# Patient Record
Sex: Male | Born: 1935 | Race: White | Hispanic: No | Marital: Married | Smoking: Former smoker
Health system: Southern US, Community
[De-identification: ages and names within clinical notes are randomized; demographics above are authoritative.]

## PROBLEM LIST (undated history)

## (undated) DIAGNOSIS — I509 Heart failure, unspecified: Secondary | ICD-10-CM

## (undated) DIAGNOSIS — I639 Cerebral infarction, unspecified: Secondary | ICD-10-CM

## (undated) DIAGNOSIS — N400 Enlarged prostate without lower urinary tract symptoms: Secondary | ICD-10-CM

## (undated) DIAGNOSIS — I4891 Unspecified atrial fibrillation: Secondary | ICD-10-CM

## (undated) DIAGNOSIS — I459 Conduction disorder, unspecified: Secondary | ICD-10-CM

## (undated) DIAGNOSIS — I6529 Occlusion and stenosis of unspecified carotid artery: Secondary | ICD-10-CM

## (undated) DIAGNOSIS — M1990 Unspecified osteoarthritis, unspecified site: Secondary | ICD-10-CM

## (undated) DIAGNOSIS — R569 Unspecified convulsions: Secondary | ICD-10-CM

## (undated) DIAGNOSIS — K219 Gastro-esophageal reflux disease without esophagitis: Secondary | ICD-10-CM

## (undated) DIAGNOSIS — E785 Hyperlipidemia, unspecified: Secondary | ICD-10-CM

## (undated) DIAGNOSIS — I10 Essential (primary) hypertension: Secondary | ICD-10-CM

## (undated) DIAGNOSIS — I2510 Atherosclerotic heart disease of native coronary artery without angina pectoris: Secondary | ICD-10-CM

## (undated) DIAGNOSIS — C801 Malignant (primary) neoplasm, unspecified: Secondary | ICD-10-CM

## (undated) DIAGNOSIS — J449 Chronic obstructive pulmonary disease, unspecified: Secondary | ICD-10-CM

## (undated) DIAGNOSIS — N289 Disorder of kidney and ureter, unspecified: Secondary | ICD-10-CM

## (undated) DIAGNOSIS — M8580 Other specified disorders of bone density and structure, unspecified site: Secondary | ICD-10-CM

## (undated) DIAGNOSIS — J852 Abscess of lung without pneumonia: Secondary | ICD-10-CM

## (undated) HISTORY — DX: Unspecified osteoarthritis, unspecified site: M19.90

## (undated) HISTORY — DX: Heart failure, unspecified: I50.9

## (undated) HISTORY — DX: Other specified disorders of bone density and structure, unspecified site: M85.80

## (undated) HISTORY — DX: Unspecified convulsions: R56.9

## (undated) HISTORY — DX: Cerebral infarction, unspecified: I63.9

## (undated) HISTORY — DX: Hyperlipidemia, unspecified: E78.5

## (undated) HISTORY — DX: Chronic obstructive pulmonary disease, unspecified: J44.9

## (undated) HISTORY — DX: Benign prostatic hyperplasia without lower urinary tract symptoms: N40.0

## (undated) HISTORY — DX: Disorder of kidney and ureter, unspecified: N28.9

## (undated) HISTORY — DX: Conduction disorder, unspecified: I45.9

## (undated) HISTORY — DX: Occlusion and stenosis of unspecified carotid artery: I65.29

## (undated) HISTORY — DX: Gastro-esophageal reflux disease without esophagitis: K21.9

## (undated) HISTORY — DX: Abscess of lung without pneumonia: J85.2

## (undated) HISTORY — DX: Essential (primary) hypertension: I10

## (undated) HISTORY — DX: Unspecified atrial fibrillation: I48.91

## (undated) HISTORY — DX: Malignant (primary) neoplasm, unspecified: C80.1

## (undated) HISTORY — PX: PACEMAKER INSERTION: SHX728

## (undated) HISTORY — DX: Atherosclerotic heart disease of native coronary artery without angina pectoris: I25.10

---

## 2000-06-17 ENCOUNTER — Encounter: Payer: Self-pay | Admitting: Infectious Diseases

## 2000-06-17 ENCOUNTER — Inpatient Hospital Stay (HOSPITAL_COMMUNITY): Admission: EM | Admit: 2000-06-17 | Discharge: 2000-06-20 | Payer: Self-pay | Admitting: Emergency Medicine

## 2000-11-26 ENCOUNTER — Encounter: Payer: Self-pay | Admitting: Emergency Medicine

## 2000-11-26 ENCOUNTER — Inpatient Hospital Stay (HOSPITAL_COMMUNITY): Admission: EM | Admit: 2000-11-26 | Discharge: 2000-11-30 | Payer: Self-pay | Admitting: Emergency Medicine

## 2001-03-07 ENCOUNTER — Encounter: Payer: Self-pay | Admitting: Emergency Medicine

## 2001-03-07 ENCOUNTER — Inpatient Hospital Stay (HOSPITAL_COMMUNITY): Admission: EM | Admit: 2001-03-07 | Discharge: 2001-03-09 | Payer: Self-pay | Admitting: Emergency Medicine

## 2001-03-25 ENCOUNTER — Emergency Department (HOSPITAL_COMMUNITY): Admission: EM | Admit: 2001-03-25 | Discharge: 2001-03-25 | Payer: Self-pay

## 2001-08-18 ENCOUNTER — Inpatient Hospital Stay (HOSPITAL_COMMUNITY): Admission: EM | Admit: 2001-08-18 | Discharge: 2001-08-19 | Payer: Self-pay

## 2002-02-14 ENCOUNTER — Encounter: Payer: Self-pay | Admitting: Critical Care Medicine

## 2002-02-14 ENCOUNTER — Inpatient Hospital Stay (HOSPITAL_COMMUNITY): Admission: AD | Admit: 2002-02-14 | Discharge: 2002-02-21 | Payer: Self-pay | Admitting: Critical Care Medicine

## 2002-02-15 ENCOUNTER — Encounter: Payer: Self-pay | Admitting: Critical Care Medicine

## 2002-02-21 ENCOUNTER — Encounter (INDEPENDENT_AMBULATORY_CARE_PROVIDER_SITE_OTHER): Payer: Self-pay | Admitting: Specialist

## 2002-02-21 ENCOUNTER — Encounter (INDEPENDENT_AMBULATORY_CARE_PROVIDER_SITE_OTHER): Payer: Self-pay | Admitting: *Deleted

## 2002-02-21 ENCOUNTER — Encounter: Payer: Self-pay | Admitting: Critical Care Medicine

## 2002-03-05 ENCOUNTER — Encounter: Payer: Self-pay | Admitting: Critical Care Medicine

## 2002-03-05 ENCOUNTER — Encounter: Admission: RE | Admit: 2002-03-05 | Discharge: 2002-03-05 | Payer: Self-pay | Admitting: Critical Care Medicine

## 2002-07-04 ENCOUNTER — Inpatient Hospital Stay (HOSPITAL_COMMUNITY): Admission: AD | Admit: 2002-07-04 | Discharge: 2002-07-05 | Payer: Self-pay | Admitting: Internal Medicine

## 2002-08-08 ENCOUNTER — Encounter: Payer: Self-pay | Admitting: Emergency Medicine

## 2002-08-08 ENCOUNTER — Inpatient Hospital Stay (HOSPITAL_COMMUNITY): Admission: EM | Admit: 2002-08-08 | Discharge: 2002-08-12 | Payer: Self-pay | Admitting: Emergency Medicine

## 2002-08-10 ENCOUNTER — Encounter: Payer: Self-pay | Admitting: Internal Medicine

## 2002-08-21 ENCOUNTER — Encounter: Payer: Self-pay | Admitting: Cardiology

## 2002-08-21 ENCOUNTER — Inpatient Hospital Stay (HOSPITAL_COMMUNITY): Admission: RE | Admit: 2002-08-21 | Discharge: 2002-08-27 | Payer: Self-pay | Admitting: Cardiology

## 2004-06-04 ENCOUNTER — Ambulatory Visit: Payer: Self-pay | Admitting: Internal Medicine

## 2004-09-07 ENCOUNTER — Ambulatory Visit: Payer: Self-pay | Admitting: Critical Care Medicine

## 2004-09-16 ENCOUNTER — Ambulatory Visit: Payer: Self-pay | Admitting: Critical Care Medicine

## 2004-10-05 ENCOUNTER — Emergency Department (HOSPITAL_COMMUNITY): Admission: EM | Admit: 2004-10-05 | Discharge: 2004-10-05 | Payer: Self-pay | Admitting: Emergency Medicine

## 2004-10-12 ENCOUNTER — Ambulatory Visit: Payer: Self-pay | Admitting: Internal Medicine

## 2004-10-14 ENCOUNTER — Ambulatory Visit: Payer: Self-pay | Admitting: Critical Care Medicine

## 2005-01-07 ENCOUNTER — Ambulatory Visit: Payer: Self-pay | Admitting: Internal Medicine

## 2005-01-12 ENCOUNTER — Ambulatory Visit: Payer: Self-pay | Admitting: Critical Care Medicine

## 2005-01-25 ENCOUNTER — Ambulatory Visit: Payer: Self-pay

## 2005-03-21 ENCOUNTER — Ambulatory Visit: Payer: Self-pay | Admitting: Internal Medicine

## 2005-03-31 ENCOUNTER — Ambulatory Visit: Payer: Self-pay | Admitting: Internal Medicine

## 2005-04-17 ENCOUNTER — Ambulatory Visit: Payer: Self-pay | Admitting: Internal Medicine

## 2005-04-17 ENCOUNTER — Inpatient Hospital Stay (HOSPITAL_COMMUNITY): Admission: EM | Admit: 2005-04-17 | Discharge: 2005-04-20 | Payer: Self-pay | Admitting: Emergency Medicine

## 2005-04-19 ENCOUNTER — Ambulatory Visit: Payer: Self-pay | Admitting: Internal Medicine

## 2005-04-19 ENCOUNTER — Ambulatory Visit: Payer: Self-pay | Admitting: Cardiology

## 2005-04-19 ENCOUNTER — Encounter: Payer: Self-pay | Admitting: Cardiology

## 2005-05-06 ENCOUNTER — Ambulatory Visit: Payer: Self-pay | Admitting: Critical Care Medicine

## 2005-06-01 ENCOUNTER — Ambulatory Visit: Payer: Self-pay | Admitting: Internal Medicine

## 2005-07-01 ENCOUNTER — Ambulatory Visit: Payer: Self-pay | Admitting: Critical Care Medicine

## 2005-10-03 ENCOUNTER — Ambulatory Visit: Payer: Self-pay | Admitting: Critical Care Medicine

## 2006-03-14 ENCOUNTER — Ambulatory Visit: Payer: Self-pay | Admitting: Internal Medicine

## 2006-03-17 ENCOUNTER — Ambulatory Visit: Payer: Self-pay | Admitting: Internal Medicine

## 2006-03-22 ENCOUNTER — Ambulatory Visit: Payer: Self-pay | Admitting: Critical Care Medicine

## 2006-03-28 ENCOUNTER — Ambulatory Visit: Payer: Self-pay | Admitting: *Deleted

## 2006-04-05 ENCOUNTER — Ambulatory Visit: Payer: Self-pay | Admitting: Internal Medicine

## 2006-04-05 LAB — CONVERTED CEMR LAB
ALT: 14 units/L (ref 0–40)
AST: 16 units/L (ref 0–37)
Albumin: 3.7 g/dL (ref 3.5–5.2)
Alkaline Phosphatase: 65 units/L (ref 39–117)
Amylase: 90 units/L (ref 27–131)
BUN: 15 mg/dL (ref 6–23)
CO2: 29 meq/L (ref 19–32)
Calcium: 8.9 mg/dL (ref 8.4–10.5)
Chloride: 101 meq/L (ref 96–112)
Creatinine, Ser: 1.1 mg/dL (ref 0.4–1.5)
GFR calc non Af Amer: 70 mL/min
Glomerular Filtration Rate, Af Am: 84 mL/min/{1.73_m2}
Glucose, Bld: 84 mg/dL (ref 70–99)
HCT: 42.3 % (ref 39.0–52.0)
Hemoglobin: 14.2 g/dL (ref 13.0–17.0)
Lipase: 22 units/L (ref 11.0–59.0)
MCHC: 33.5 g/dL (ref 30.0–36.0)
MCV: 98.8 fL (ref 78.0–100.0)
Platelets: 199 10*3/uL (ref 150–400)
Potassium: 4.4 meq/L (ref 3.5–5.1)
RBC: 4.28 M/uL (ref 4.22–5.81)
RDW: 13.5 % (ref 11.5–14.6)
Sodium: 136 meq/L (ref 135–145)
Total Bilirubin: 0.9 mg/dL (ref 0.3–1.2)
Total Protein: 6.8 g/dL (ref 6.0–8.3)
WBC: 5.1 10*3/uL (ref 4.5–10.5)

## 2006-04-17 ENCOUNTER — Ambulatory Visit: Payer: Self-pay | Admitting: Internal Medicine

## 2006-04-19 ENCOUNTER — Ambulatory Visit: Payer: Self-pay | Admitting: Critical Care Medicine

## 2006-04-20 ENCOUNTER — Encounter (INDEPENDENT_AMBULATORY_CARE_PROVIDER_SITE_OTHER): Payer: Self-pay | Admitting: Specialist

## 2006-04-20 ENCOUNTER — Ambulatory Visit: Payer: Self-pay | Admitting: Internal Medicine

## 2006-04-20 DIAGNOSIS — K449 Diaphragmatic hernia without obstruction or gangrene: Secondary | ICD-10-CM | POA: Insufficient documentation

## 2006-05-25 ENCOUNTER — Ambulatory Visit: Payer: Self-pay | Admitting: Internal Medicine

## 2006-08-22 ENCOUNTER — Ambulatory Visit: Payer: Self-pay | Admitting: Critical Care Medicine

## 2006-08-29 ENCOUNTER — Ambulatory Visit: Payer: Self-pay | Admitting: Internal Medicine

## 2006-09-01 DIAGNOSIS — I4891 Unspecified atrial fibrillation: Secondary | ICD-10-CM | POA: Insufficient documentation

## 2006-09-01 DIAGNOSIS — N400 Enlarged prostate without lower urinary tract symptoms: Secondary | ICD-10-CM

## 2006-09-01 DIAGNOSIS — K209 Esophagitis, unspecified without bleeding: Secondary | ICD-10-CM | POA: Insufficient documentation

## 2006-09-01 DIAGNOSIS — J439 Emphysema, unspecified: Secondary | ICD-10-CM

## 2007-01-29 ENCOUNTER — Encounter: Payer: Self-pay | Admitting: Internal Medicine

## 2007-01-30 ENCOUNTER — Encounter: Payer: Self-pay | Admitting: Internal Medicine

## 2007-01-31 ENCOUNTER — Other Ambulatory Visit: Payer: Self-pay | Admitting: Emergency Medicine

## 2007-01-31 ENCOUNTER — Telehealth: Payer: Self-pay | Admitting: Internal Medicine

## 2007-02-01 ENCOUNTER — Observation Stay (HOSPITAL_COMMUNITY): Admission: EM | Admit: 2007-02-01 | Discharge: 2007-02-02 | Payer: Self-pay | Admitting: Internal Medicine

## 2007-02-01 ENCOUNTER — Ambulatory Visit: Payer: Self-pay | Admitting: Internal Medicine

## 2007-02-16 ENCOUNTER — Telehealth (INDEPENDENT_AMBULATORY_CARE_PROVIDER_SITE_OTHER): Payer: Self-pay | Admitting: *Deleted

## 2007-02-16 ENCOUNTER — Ambulatory Visit: Payer: Self-pay | Admitting: Internal Medicine

## 2007-02-16 DIAGNOSIS — E785 Hyperlipidemia, unspecified: Secondary | ICD-10-CM

## 2007-02-16 DIAGNOSIS — R569 Unspecified convulsions: Secondary | ICD-10-CM | POA: Insufficient documentation

## 2007-02-16 DIAGNOSIS — I10 Essential (primary) hypertension: Secondary | ICD-10-CM | POA: Insufficient documentation

## 2007-02-19 ENCOUNTER — Encounter (INDEPENDENT_AMBULATORY_CARE_PROVIDER_SITE_OTHER): Payer: Self-pay | Admitting: *Deleted

## 2007-02-19 LAB — CONVERTED CEMR LAB
BUN: 11 mg/dL (ref 6–23)
Basophils Absolute: 0 10*3/uL (ref 0.0–0.1)
Basophils Relative: 0 % (ref 0.0–1.0)
CO2: 31 meq/L (ref 19–32)
Calcium: 9 mg/dL (ref 8.4–10.5)
Chloride: 103 meq/L (ref 96–112)
Creatinine, Ser: 0.9 mg/dL (ref 0.4–1.5)
Eosinophils Absolute: 0.1 10*3/uL (ref 0.0–0.6)
Eosinophils Relative: 2.4 % (ref 0.0–5.0)
GFR calc Af Amer: 106 mL/min
GFR calc non Af Amer: 88 mL/min
Glucose, Bld: 91 mg/dL (ref 70–99)
HCT: 41.4 % (ref 39.0–52.0)
Hemoglobin: 13.9 g/dL (ref 13.0–17.0)
Iron: 90 ug/dL (ref 42–165)
Lymphocytes Relative: 22.3 % (ref 12.0–46.0)
MCHC: 33.6 g/dL (ref 30.0–36.0)
MCV: 99.8 fL (ref 78.0–100.0)
Monocytes Absolute: 0.6 10*3/uL (ref 0.2–0.7)
Monocytes Relative: 10.7 % (ref 3.0–11.0)
Neutro Abs: 3.3 10*3/uL (ref 1.4–7.7)
Neutrophils Relative %: 64.6 % (ref 43.0–77.0)
Platelets: 169 10*3/uL (ref 150–400)
Potassium: 4.2 meq/L (ref 3.5–5.1)
RBC: 4.15 M/uL — ABNORMAL LOW (ref 4.22–5.81)
RDW: 13.8 % (ref 11.5–14.6)
Sodium: 139 meq/L (ref 135–145)
WBC: 5.2 10*3/uL (ref 4.5–10.5)

## 2007-03-04 DIAGNOSIS — J852 Abscess of lung without pneumonia: Secondary | ICD-10-CM | POA: Insufficient documentation

## 2007-03-04 DIAGNOSIS — I2510 Atherosclerotic heart disease of native coronary artery without angina pectoris: Secondary | ICD-10-CM | POA: Insufficient documentation

## 2007-03-04 DIAGNOSIS — K219 Gastro-esophageal reflux disease without esophagitis: Secondary | ICD-10-CM

## 2007-03-12 ENCOUNTER — Ambulatory Visit: Payer: Self-pay | Admitting: Internal Medicine

## 2007-03-28 ENCOUNTER — Ambulatory Visit: Payer: Self-pay | Admitting: Internal Medicine

## 2007-04-27 ENCOUNTER — Telehealth: Payer: Self-pay | Admitting: Critical Care Medicine

## 2007-04-30 ENCOUNTER — Ambulatory Visit: Payer: Self-pay | Admitting: Critical Care Medicine

## 2007-04-30 DIAGNOSIS — I509 Heart failure, unspecified: Secondary | ICD-10-CM | POA: Insufficient documentation

## 2007-04-30 LAB — CONVERTED CEMR LAB
BUN: 14 mg/dL (ref 6–23)
Basophils Absolute: 0 10*3/uL (ref 0.0–0.1)
Basophils Relative: 0.1 % (ref 0.0–1.0)
CO2: 28 meq/L (ref 19–32)
Calcium: 8.7 mg/dL (ref 8.4–10.5)
Chloride: 97 meq/L (ref 96–112)
Creatinine, Ser: 1 mg/dL (ref 0.4–1.5)
Eosinophils Absolute: 0.1 10*3/uL (ref 0.0–0.6)
Eosinophils Relative: 2.9 % (ref 0.0–5.0)
GFR calc Af Amer: 94 mL/min
GFR calc non Af Amer: 78 mL/min
Glucose, Bld: 129 mg/dL — ABNORMAL HIGH (ref 70–99)
HCT: 39.7 % (ref 39.0–52.0)
Hemoglobin: 13.2 g/dL (ref 13.0–17.0)
INR: 2 — ABNORMAL HIGH (ref 0.8–1.0)
Lymphocytes Relative: 20.4 % (ref 12.0–46.0)
MCHC: 33.2 g/dL (ref 30.0–36.0)
MCV: 98.9 fL (ref 78.0–100.0)
Monocytes Absolute: 0.7 10*3/uL (ref 0.2–0.7)
Monocytes Relative: 13.6 % — ABNORMAL HIGH (ref 3.0–11.0)
Neutro Abs: 3 10*3/uL (ref 1.4–7.7)
Neutrophils Relative %: 63 % (ref 43.0–77.0)
Platelets: 185 10*3/uL (ref 150–400)
Potassium: 3.8 meq/L (ref 3.5–5.1)
Prothrombin Time: 17.4 s — ABNORMAL HIGH (ref 10.9–13.3)
RBC: 4.01 M/uL — ABNORMAL LOW (ref 4.22–5.81)
RDW: 12.9 % (ref 11.5–14.6)
Sodium: 131 meq/L — ABNORMAL LOW (ref 135–145)
WBC: 4.8 10*3/uL (ref 4.5–10.5)

## 2007-05-14 ENCOUNTER — Ambulatory Visit: Payer: Self-pay | Admitting: Critical Care Medicine

## 2007-05-18 ENCOUNTER — Ambulatory Visit: Payer: Self-pay | Admitting: Critical Care Medicine

## 2007-05-18 ENCOUNTER — Encounter: Payer: Self-pay | Admitting: Critical Care Medicine

## 2007-05-22 ENCOUNTER — Telehealth: Payer: Self-pay | Admitting: Critical Care Medicine

## 2007-06-21 ENCOUNTER — Telehealth (INDEPENDENT_AMBULATORY_CARE_PROVIDER_SITE_OTHER): Payer: Self-pay | Admitting: *Deleted

## 2007-06-21 ENCOUNTER — Ambulatory Visit: Payer: Self-pay | Admitting: Critical Care Medicine

## 2007-07-12 ENCOUNTER — Encounter: Payer: Self-pay | Admitting: Internal Medicine

## 2007-07-16 DIAGNOSIS — D68318 Other hemorrhagic disorder due to intrinsic circulating anticoagulants, antibodies, or inhibitors: Secondary | ICD-10-CM | POA: Insufficient documentation

## 2007-07-27 ENCOUNTER — Encounter: Payer: Self-pay | Admitting: Critical Care Medicine

## 2007-08-02 ENCOUNTER — Encounter: Payer: Self-pay | Admitting: Internal Medicine

## 2007-08-15 ENCOUNTER — Ambulatory Visit: Payer: Self-pay | Admitting: Critical Care Medicine

## 2007-08-24 ENCOUNTER — Encounter: Payer: Self-pay | Admitting: Critical Care Medicine

## 2007-09-03 ENCOUNTER — Encounter: Payer: Self-pay | Admitting: Internal Medicine

## 2007-12-04 ENCOUNTER — Encounter: Payer: Self-pay | Admitting: Internal Medicine

## 2008-01-03 ENCOUNTER — Encounter: Payer: Self-pay | Admitting: Internal Medicine

## 2008-02-05 ENCOUNTER — Encounter: Payer: Self-pay | Admitting: Internal Medicine

## 2008-02-18 ENCOUNTER — Ambulatory Visit: Payer: Self-pay | Admitting: Internal Medicine

## 2008-03-21 ENCOUNTER — Encounter: Payer: Self-pay | Admitting: Critical Care Medicine

## 2008-03-24 ENCOUNTER — Encounter: Payer: Self-pay | Admitting: Internal Medicine

## 2008-03-28 ENCOUNTER — Encounter: Payer: Self-pay | Admitting: Internal Medicine

## 2008-04-03 ENCOUNTER — Ambulatory Visit: Payer: Self-pay | Admitting: Internal Medicine

## 2008-04-07 ENCOUNTER — Encounter: Payer: Self-pay | Admitting: Internal Medicine

## 2008-04-11 ENCOUNTER — Encounter: Payer: Self-pay | Admitting: Internal Medicine

## 2008-05-12 ENCOUNTER — Encounter: Payer: Self-pay | Admitting: Critical Care Medicine

## 2008-05-13 ENCOUNTER — Encounter (INDEPENDENT_AMBULATORY_CARE_PROVIDER_SITE_OTHER): Payer: Self-pay | Admitting: *Deleted

## 2008-05-22 ENCOUNTER — Encounter: Payer: Self-pay | Admitting: Critical Care Medicine

## 2008-06-17 ENCOUNTER — Encounter: Payer: Self-pay | Admitting: Internal Medicine

## 2008-06-23 HISTORY — PX: CARDIAC DEFIBRILLATOR PLACEMENT: SHX171

## 2008-06-24 ENCOUNTER — Ambulatory Visit: Payer: Self-pay | Admitting: Internal Medicine

## 2008-07-11 ENCOUNTER — Encounter: Payer: Self-pay | Admitting: Internal Medicine

## 2008-07-15 ENCOUNTER — Encounter: Payer: Self-pay | Admitting: Internal Medicine

## 2008-07-18 ENCOUNTER — Encounter: Payer: Self-pay | Admitting: Internal Medicine

## 2008-07-22 ENCOUNTER — Encounter: Payer: Self-pay | Admitting: Internal Medicine

## 2008-07-25 ENCOUNTER — Telehealth (INDEPENDENT_AMBULATORY_CARE_PROVIDER_SITE_OTHER): Payer: Self-pay | Admitting: *Deleted

## 2008-07-25 ENCOUNTER — Encounter: Payer: Self-pay | Admitting: Critical Care Medicine

## 2008-07-29 ENCOUNTER — Ambulatory Visit: Payer: Self-pay | Admitting: Critical Care Medicine

## 2008-07-30 ENCOUNTER — Ambulatory Visit: Payer: Self-pay | Admitting: Internal Medicine

## 2008-07-30 DIAGNOSIS — K4090 Unilateral inguinal hernia, without obstruction or gangrene, not specified as recurrent: Secondary | ICD-10-CM | POA: Insufficient documentation

## 2008-07-30 LAB — CONVERTED CEMR LAB
BUN: 16 mg/dL (ref 6–23)
CO2: 28 meq/L (ref 19–32)
Calcium: 8.6 mg/dL (ref 8.4–10.5)
Chloride: 102 meq/L (ref 96–112)
Creatinine, Ser: 0.9 mg/dL (ref 0.4–1.5)
GFR calc Af Amer: 106 mL/min
GFR calc non Af Amer: 87 mL/min
Glucose, Bld: 99 mg/dL (ref 70–99)
Potassium: 4.2 meq/L (ref 3.5–5.1)
Pro B Natriuretic peptide (BNP): 1095 pg/mL — ABNORMAL HIGH (ref 0.0–100.0)
Sodium: 139 meq/L (ref 135–145)

## 2008-08-05 ENCOUNTER — Ambulatory Visit: Payer: Self-pay | Admitting: Critical Care Medicine

## 2008-08-06 ENCOUNTER — Encounter: Payer: Self-pay | Admitting: Critical Care Medicine

## 2008-09-09 ENCOUNTER — Encounter: Payer: Self-pay | Admitting: Internal Medicine

## 2008-10-14 ENCOUNTER — Encounter: Payer: Self-pay | Admitting: Internal Medicine

## 2008-11-06 ENCOUNTER — Ambulatory Visit: Payer: Self-pay | Admitting: Internal Medicine

## 2008-11-06 DIAGNOSIS — M549 Dorsalgia, unspecified: Secondary | ICD-10-CM | POA: Insufficient documentation

## 2008-11-06 LAB — CONVERTED CEMR LAB
Bilirubin Urine: NEGATIVE
Blood in Urine, dipstick: NEGATIVE
Glucose, Urine, Semiquant: NEGATIVE
Ketones, urine, test strip: NEGATIVE
Nitrite: NEGATIVE
Protein, U semiquant: NEGATIVE
Specific Gravity, Urine: 1.01
Urobilinogen, UA: 0.2
WBC Urine, dipstick: NEGATIVE
pH: 7

## 2008-11-11 DIAGNOSIS — M949 Disorder of cartilage, unspecified: Secondary | ICD-10-CM

## 2008-11-11 DIAGNOSIS — M899 Disorder of bone, unspecified: Secondary | ICD-10-CM | POA: Insufficient documentation

## 2008-11-11 DIAGNOSIS — M1990 Unspecified osteoarthritis, unspecified site: Secondary | ICD-10-CM

## 2008-11-11 LAB — CONVERTED CEMR LAB
BUN: 21 mg/dL (ref 6–23)
Basophils Absolute: 0 10*3/uL (ref 0.0–0.1)
Basophils Relative: 0.7 % (ref 0.0–3.0)
CO2: 28 meq/L (ref 19–32)
Calcium: 8.7 mg/dL (ref 8.4–10.5)
Chloride: 103 meq/L (ref 96–112)
Creatinine, Ser: 1.1 mg/dL (ref 0.4–1.5)
Eosinophils Absolute: 0.2 10*3/uL (ref 0.0–0.7)
Eosinophils Relative: 5.6 % — ABNORMAL HIGH (ref 0.0–5.0)
GFR calc non Af Amer: 69.09 mL/min (ref >60)
Glucose, Bld: 83 mg/dL (ref 70–99)
HCT: 40.3 % (ref 39.0–52.0)
Hemoglobin: 14.3 g/dL (ref 13.0–17.0)
Lymphocytes Relative: 29.4 % (ref 12.0–46.0)
Lymphs Abs: 1.1 10*3/uL (ref 0.7–4.0)
MCHC: 35.5 g/dL (ref 30.0–36.0)
MCV: 93.4 fL (ref 78.0–100.0)
Monocytes Absolute: 0.5 10*3/uL (ref 0.1–1.0)
Monocytes Relative: 14.1 % — ABNORMAL HIGH (ref 3.0–12.0)
Neutro Abs: 2 10*3/uL (ref 1.4–7.7)
Neutrophils Relative %: 50.2 % (ref 43.0–77.0)
Platelets: 131 10*3/uL — ABNORMAL LOW (ref 150.0–400.0)
Potassium: 4.5 meq/L (ref 3.5–5.1)
RBC: 4.32 M/uL (ref 4.22–5.81)
RDW: 15.3 % — ABNORMAL HIGH (ref 11.5–14.6)
Sodium: 138 meq/L (ref 135–145)
WBC: 3.8 10*3/uL — ABNORMAL LOW (ref 4.5–10.5)

## 2008-11-18 ENCOUNTER — Encounter: Payer: Self-pay | Admitting: Internal Medicine

## 2008-11-27 ENCOUNTER — Ambulatory Visit: Payer: Self-pay | Admitting: Family Medicine

## 2008-11-27 ENCOUNTER — Encounter: Payer: Self-pay | Admitting: Internal Medicine

## 2008-12-11 ENCOUNTER — Encounter: Payer: Self-pay | Admitting: Critical Care Medicine

## 2008-12-31 ENCOUNTER — Ambulatory Visit: Payer: Self-pay | Admitting: Internal Medicine

## 2008-12-31 ENCOUNTER — Telehealth (INDEPENDENT_AMBULATORY_CARE_PROVIDER_SITE_OTHER): Payer: Self-pay | Admitting: *Deleted

## 2009-01-01 ENCOUNTER — Encounter: Payer: Self-pay | Admitting: Internal Medicine

## 2009-01-02 ENCOUNTER — Ambulatory Visit: Payer: Self-pay | Admitting: Internal Medicine

## 2009-01-08 ENCOUNTER — Encounter (INDEPENDENT_AMBULATORY_CARE_PROVIDER_SITE_OTHER): Payer: Self-pay | Admitting: *Deleted

## 2009-03-17 ENCOUNTER — Encounter: Payer: Self-pay | Admitting: Internal Medicine

## 2009-03-25 ENCOUNTER — Encounter: Payer: Self-pay | Admitting: Critical Care Medicine

## 2009-04-21 ENCOUNTER — Telehealth (INDEPENDENT_AMBULATORY_CARE_PROVIDER_SITE_OTHER): Payer: Self-pay | Admitting: *Deleted

## 2009-04-21 ENCOUNTER — Ambulatory Visit: Payer: Self-pay | Admitting: Internal Medicine

## 2009-07-08 ENCOUNTER — Encounter: Payer: Self-pay | Admitting: Critical Care Medicine

## 2009-08-04 ENCOUNTER — Telehealth (INDEPENDENT_AMBULATORY_CARE_PROVIDER_SITE_OTHER): Payer: Self-pay | Admitting: *Deleted

## 2009-09-21 ENCOUNTER — Ambulatory Visit: Payer: Self-pay | Admitting: Internal Medicine

## 2009-09-23 ENCOUNTER — Encounter (INDEPENDENT_AMBULATORY_CARE_PROVIDER_SITE_OTHER): Payer: Self-pay | Admitting: *Deleted

## 2009-09-24 ENCOUNTER — Telehealth: Payer: Self-pay | Admitting: Critical Care Medicine

## 2009-09-25 ENCOUNTER — Encounter: Payer: Self-pay | Admitting: Internal Medicine

## 2009-09-29 ENCOUNTER — Telehealth: Payer: Self-pay | Admitting: Critical Care Medicine

## 2009-10-05 ENCOUNTER — Encounter: Payer: Self-pay | Admitting: Critical Care Medicine

## 2009-10-05 HISTORY — PX: HERNIA REPAIR: SHX51

## 2009-10-09 ENCOUNTER — Telehealth: Payer: Self-pay | Admitting: Critical Care Medicine

## 2009-10-12 ENCOUNTER — Ambulatory Visit: Payer: Self-pay | Admitting: Internal Medicine

## 2009-10-12 ENCOUNTER — Ambulatory Visit: Payer: Self-pay | Admitting: Critical Care Medicine

## 2009-10-13 LAB — CONVERTED CEMR LAB
BUN: 22 mg/dL (ref 6–23)
Basophils Absolute: 0 10*3/uL (ref 0.0–0.1)
Basophils Relative: 0.5 % (ref 0.0–3.0)
Bilirubin Urine: NEGATIVE
CO2: 31 meq/L (ref 19–32)
Calcium: 8.7 mg/dL (ref 8.4–10.5)
Chloride: 102 meq/L (ref 96–112)
Cholesterol: 131 mg/dL (ref 0–200)
Creatinine, Ser: 1.1 mg/dL (ref 0.4–1.5)
Eosinophils Absolute: 0.1 10*3/uL (ref 0.0–0.7)
Eosinophils Relative: 3.3 % (ref 0.0–5.0)
GFR calc non Af Amer: 72.72 mL/min (ref >60)
Glucose, Bld: 95 mg/dL (ref 70–99)
HCT: 42.5 % (ref 39.0–52.0)
HDL: 41 mg/dL (ref >39.00)
Hemoglobin, Urine: NEGATIVE
Hemoglobin: 14.7 g/dL (ref 13.0–17.0)
Ketones, ur: NEGATIVE mg/dL
LDL Cholesterol: 80 mg/dL (ref 0–99)
Leukocytes, UA: NEGATIVE
Lymphocytes Relative: 24.2 % (ref 12.0–46.0)
Lymphs Abs: 0.9 10*3/uL (ref 0.7–4.0)
MCHC: 34.5 g/dL (ref 30.0–36.0)
MCV: 95.8 fL (ref 78.0–100.0)
Monocytes Absolute: 0.5 10*3/uL (ref 0.1–1.0)
Monocytes Relative: 13.8 % — ABNORMAL HIGH (ref 3.0–12.0)
Neutro Abs: 2.2 10*3/uL (ref 1.4–7.7)
Neutrophils Relative %: 58.2 % (ref 43.0–77.0)
Nitrite: NEGATIVE
PSA: 0.51 ng/mL (ref 0.10–4.00)
Platelets: 159 10*3/uL (ref 150.0–400.0)
Potassium: 4.2 meq/L (ref 3.5–5.1)
RBC: 4.44 M/uL (ref 4.22–5.81)
RDW: 14.4 % (ref 11.5–14.6)
Sodium: 138 meq/L (ref 135–145)
Specific Gravity, Urine: 1.02 (ref 1.000–1.030)
TSH: 2.36 microintl units/mL (ref 0.35–5.50)
Total CHOL/HDL Ratio: 3
Total Protein, Urine: NEGATIVE mg/dL
Triglycerides: 50 mg/dL (ref 0.0–149.0)
Urine Glucose: NEGATIVE mg/dL
Urobilinogen, UA: 2 (ref 0.0–1.0)
VLDL: 10 mg/dL (ref 0.0–40.0)
WBC: 3.9 10*3/uL — ABNORMAL LOW (ref 4.5–10.5)
pH: 6.5 (ref 5.0–8.0)

## 2009-10-20 ENCOUNTER — Telehealth (INDEPENDENT_AMBULATORY_CARE_PROVIDER_SITE_OTHER): Payer: Self-pay | Admitting: *Deleted

## 2009-10-20 ENCOUNTER — Encounter (INDEPENDENT_AMBULATORY_CARE_PROVIDER_SITE_OTHER): Payer: Self-pay | Admitting: *Deleted

## 2009-10-29 ENCOUNTER — Encounter: Payer: Self-pay | Admitting: Internal Medicine

## 2009-11-13 ENCOUNTER — Encounter: Payer: Self-pay | Admitting: Internal Medicine

## 2009-11-19 ENCOUNTER — Ambulatory Visit: Payer: Self-pay | Admitting: Family Medicine

## 2009-12-02 ENCOUNTER — Encounter: Payer: Self-pay | Admitting: Internal Medicine

## 2009-12-23 ENCOUNTER — Encounter: Payer: Self-pay | Admitting: Internal Medicine

## 2009-12-30 ENCOUNTER — Telehealth (INDEPENDENT_AMBULATORY_CARE_PROVIDER_SITE_OTHER): Payer: Self-pay | Admitting: *Deleted

## 2010-01-01 ENCOUNTER — Telehealth: Payer: Self-pay | Admitting: Critical Care Medicine

## 2010-01-15 ENCOUNTER — Encounter: Payer: Self-pay | Admitting: Critical Care Medicine

## 2010-03-02 ENCOUNTER — Encounter: Payer: Self-pay | Admitting: Critical Care Medicine

## 2010-03-17 ENCOUNTER — Encounter: Payer: Self-pay | Admitting: Internal Medicine

## 2010-04-27 ENCOUNTER — Encounter: Payer: Self-pay | Admitting: Internal Medicine

## 2010-05-06 ENCOUNTER — Encounter: Payer: Self-pay | Admitting: Internal Medicine

## 2010-05-21 ENCOUNTER — Encounter: Payer: Self-pay | Admitting: Critical Care Medicine

## 2010-06-04 ENCOUNTER — Encounter: Payer: Self-pay | Admitting: Internal Medicine

## 2010-06-09 ENCOUNTER — Encounter: Payer: Self-pay | Admitting: Internal Medicine

## 2010-06-16 ENCOUNTER — Other Ambulatory Visit: Payer: Self-pay | Admitting: Internal Medicine

## 2010-06-16 ENCOUNTER — Encounter: Payer: Self-pay | Admitting: Internal Medicine

## 2010-06-16 ENCOUNTER — Ambulatory Visit
Admission: RE | Admit: 2010-06-16 | Discharge: 2010-06-16 | Payer: Self-pay | Source: Home / Self Care | Attending: Internal Medicine | Admitting: Internal Medicine

## 2010-06-16 DIAGNOSIS — N529 Male erectile dysfunction, unspecified: Secondary | ICD-10-CM | POA: Insufficient documentation

## 2010-06-16 LAB — CONVERTED CEMR LAB: Vit D, 25-Hydroxy: 26 ng/mL — ABNORMAL LOW (ref 30–89)

## 2010-06-17 LAB — CBC WITH DIFFERENTIAL/PLATELET
Basophils Absolute: 0 10*3/uL (ref 0.0–0.1)
Basophils Relative: 0.5 % (ref 0.0–3.0)
Eosinophils Absolute: 0.1 10*3/uL (ref 0.0–0.7)
Eosinophils Relative: 3.2 % (ref 0.0–5.0)
HCT: 42.3 % (ref 39.0–52.0)
Hemoglobin: 14.6 g/dL (ref 13.0–17.0)
Lymphocytes Relative: 27.1 % (ref 12.0–46.0)
Lymphs Abs: 1.1 10*3/uL (ref 0.7–4.0)
MCHC: 34.4 g/dL (ref 30.0–36.0)
Monocytes Absolute: 0.7 10*3/uL (ref 0.1–1.0)
Neutro Abs: 2.1 10*3/uL (ref 1.4–7.7)
Neutrophils Relative %: 52.1 % (ref 43.0–77.0)
Platelets: 146 10*3/uL — ABNORMAL LOW (ref 150.0–400.0)
RBC: 4.37 Mil/uL (ref 4.22–5.81)
RDW: 14.1 % (ref 11.5–14.6)
WBC: 4 10*3/uL — ABNORMAL LOW (ref 4.5–10.5)

## 2010-06-17 LAB — BASIC METABOLIC PANEL
BUN: 14 mg/dL (ref 6–23)
CO2: 30 mEq/L (ref 19–32)
Chloride: 101 mEq/L (ref 96–112)
Creatinine, Ser: 1 mg/dL (ref 0.4–1.5)
Glucose, Bld: 87 mg/dL (ref 70–99)
Potassium: 4.4 mEq/L (ref 3.5–5.1)
Sodium: 137 mEq/L (ref 135–145)

## 2010-06-17 LAB — ALT: ALT: 12 U/L (ref 0–53)

## 2010-06-17 LAB — AST: AST: 15 U/L (ref 0–37)

## 2010-06-18 ENCOUNTER — Encounter: Payer: Self-pay | Admitting: Internal Medicine

## 2010-06-20 ENCOUNTER — Telehealth: Payer: Self-pay | Admitting: Internal Medicine

## 2010-06-20 LAB — CONVERTED CEMR LAB
BUN: 20 mg/dL
CO2, serum: 20 mmol/L
Chloride, Serum: 101 mmol/L
Creatinine, Ser: 0.96 mg/dL
Glucose, Bld: 91 mg/dL
HCT: 41.3 %
Hemoglobin: 13.1 g/dL
MCH: 31.7 pg
MCV: 93.4 fL
Platelets: 170 10*3/uL
Potassium, serum: 4.5 mmol/L
RBC count: 4.42 10*6/uL
Sodium, serum: 135 mmol/L
WBC, blood: 5.5 10*3/uL

## 2010-06-21 ENCOUNTER — Encounter: Payer: Self-pay | Admitting: Internal Medicine

## 2010-06-21 LAB — CONVERTED CEMR LAB: Phenytoin Lvl: <0.5 ug/mL — ABNORMAL LOW (ref 10.0–20.0)

## 2010-06-22 NOTE — Miscellaneous (Signed)
Summary: OT Orders & Eval & Recert/[facility]  OT Orders & Eval & Recert/[facility]   Imported By: Lanelle Bal 09/30/2009 13:20:11  _____________________________________________________________________  External Attachment:    Type:   Image     Comment:   External Document

## 2010-06-22 NOTE — Letter (Signed)
Summary: unable to leave message   [place] at [facility] [address] [place], [place]  [address] Phone: [phone]      Oct 20, 2009   BRNADON EOFF [address] [place], [place] [address]  RE:  LAB RESULTS  Dear  Mr. STAIRS,  The following is an interpretation of your most recent lab tests.  Please take note of any instructions provided or changes to medications that have resulted from your lab work.     The xray of your spine was normal.  Please call our offie with any questions or concerns. Alena Bills

## 2010-06-22 NOTE — Medication Information (Signed)
Summary: [facility]  [facility]   Imported By: Sherian Rein 10/21/2009 10:08:55  _____________________________________________________________________  External Attachment:    Type:   Image     Comment:   External Document

## 2010-06-22 NOTE — Assessment & Plan Note (Signed)
Summary: [facility] per pt//lch   Vital Signs:  Patient profile:   78 year old male Height:      72 inches Weight:      171 pounds BMI:     23.28 Pulse rate:   74 / minute BP sitting:   112 / 86  Vitals Entered By: Shary Decamp (Sep 21, 2009 3:29 PM) CC: ROV, C/O OF LOW BACK PAIN (HAS HYDROCODONE @ HOME)   History of Present Illness: ROV c/o mid-back pain, s/p PT at [facility], had some massages  symptoms started years ago, initially on-off, now more steady no radiation aleve has helped in the past   complaining of inguinal hernia getting bigger, see review of systems   Hyperlipidemia-- good medication compliance  Hypertension-- ambulatory BPs in the 120s      Current Medications (verified): 1)  Dilantin 100 Mg  Caps (Phenytoin Sodium Extended) .... 2 By Mouth Bid 2)  Warfarin Sodium 5 Mg Tabs (Warfarin Sodium) .... As Directed 3)  Pulmicort 0.25 Mg/60ml Susp (Budesonide) .... Two Times A Day 4)  Atrovent 0.03 %  Soln (Ipratropium Bromide) .... One Vial in Nebulizer Two Times A Day 5)  Oxygen .... 2l Rest and 3 L Exertion 6)  Nasonex 50 Mcg/act  Susp (Mometasone Furoate) .... Two Puffs Each Nostril Daily 7)  Coreg 3.125 Mg Tabs (Carvedilol) .... Once Daily 8)  Lisinopril 5 Mg Tabs (Lisinopril) .[name].. 1 By Mouth Once Daily 9)  Gaviscon Extra Strength 160-105 Mg Chew (Alum Hydroxide-Mag Carbonate) .... Two Times A Day 10)  Simvastatin 40 Mg Tabs (Simvastatin) .... 1/2 By Mouth Once Daily 11)  Hydrocodone-Acetaminophen 2.5-500 Mg Tabs (Hydrocodone-Acetaminophen) .... One By Mouth  Every 4 Hours As Needed 12)  Physical Therapy and Occupational Therapy .... Evaluate and Treat  Allergies (verified): 1)  Penicillin  Past History:  Past Medical History: CAD  Atrial fibrillation s/p ablation 07-2007 h/o complete heart block and pacemaker dependence , s/p defib change 06-2008 Non-ischemic cardiomyopathy Coumadin f/u at [name] office  COPD BPH SZ d/o (sees [name], last OV  2006) Hyperlipidemia Hypertension Hx of ABSCESS, LUNG   GERD-- severe esophagitis per EGD 2007 osteopenia --per DEXA  6/11 osteoarthritis MDs--Dr Love, cardilogy ([name] @ [facility], [name] @ [facility]), pulmonary [name] , urology  name?, GI [name]  Past Surgical History: Reviewed history from 07/29/2008 and no changes required. Pacemaker: (St. Jude) Defibrillator- 06/2008  Social History: Reviewed history from 02/16/2007 and no changes required. Former Smoke Married  Review of Systems General:  Denies fever. CV:  Denies chest pain or discomfort and swelling of feet. GI:  Denies bloody stools, diarrhea, nausea, and vomiting; inguinal hernia has not become painful. GU:  (+) urgency, no dysuria  (-) gross hematuria  saw urology 2 years ago, Rx a patch, used x a while, symptoms resolved .  Physical Exam  General:  alert and well-developed.   Lungs:  normal respiratory effort, no intercostal retractions, and no accessory muscle use.  no rales, no distress. BS slightly  decreased Heart:  normal rate, regular rhythm, and no murmur.   Abdomen:  soft, non-tender, and no distention.   has a large, easily reducible right inguinal hernia, approximate size 4 x 4 inches  Genitalia:  scrotal examination normal Prostate:  prostate is slightly enlarged, not tender, seems to slightly indurated   Impression & Recommendations:  Problem # 1:  OSTEOARTHRITIS, THORACIC SPINE (ICD-715.38) x-rays 10/2008 showed OA redo x-ray, r/o other d/o (?mets)  His updated medication list for this problem includes:    Hydrocodone-acetaminophen 2.5-500 Mg Tabs (Hydrocodone-acetaminophen) ..... One by mouth  every 4 hours as needed  Orders: T-Thoracic Spine 2 Views [phone])  Problem # 2:  ROUTINE GENERAL MEDICAL EXAM@HEALTH  CARE FACL (ICD-V70.0) chart reviewed, see below, will ask him  to come back for a yearly checkup at some point in the near  future Td 8-10 pneumonia shot 8-10 does not  have a recent PSA does not have a colonoscopy, he has seen GI before, on  11- 2008 they were contemplating colon cancer screening , recognizing his multiple comorbidities  Problem # 3:  INGUINAL HERNIA, RIGHT (ICD-550.90) has a large right hernia, size has definitely increased  although he is not likely a surgical candidate , I like surgery to see and know the patient in case he ever needs intervention knows to go to the ER if severe pain   Orders: Surgical Referral (Surgery)  Problem # 4:  CORONARY ARTERY DISEASE (ICD-414.00) asx  His updated medication list for this problem includes:    Coreg 3.125 Mg Tabs (Carvedilol) ..... Once daily    Lisinopril 5 Mg Tabs (Lisinopril) .[name]... 1 by mouth once daily  Problem # 5:  HYPERTENSION (ICD-401.9) at goal  His updated medication list for this problem includes:    Coreg 3.125 Mg Tabs (Carvedilol) ..... Once daily    Lisinopril 5 Mg Tabs (Lisinopril) .[name]... 1 by mouth once daily  BP today: 112/86 Prior BP: 102/70 (11/06/2008)  Labs Reviewed: K+: 4.5 (11/06/2008) Creat: : 1.1 (11/06/2008)     Problem # 6:  BENIGN PROSTATIC HYPERTROPHY (ICD-600.00) no recent PSA he continue with symptoms, see review of systems rectal exams today showed a slightly indurated prostate plan: check PSA Will have to find a medication for symptomatic treatment , options limited d/t interactions w/  dilantin  Complete Medication List: 1)  Dilantin 100 Mg Caps (Phenytoin sodium extended) .... 2 by mouth bid 2)  Warfarin Sodium 5 Mg Tabs (Warfarin sodium) .... As directed 3)  Pulmicort 0.25 Mg/50ml Susp (Budesonide) .... Two times a day 4)  Atrovent 0.03 % Soln (Ipratropium bromide) .... One vial in nebulizer two times a day 5)  Oxygen  .... 2l rest and 3 l exertion 6)  Nasonex 50 Mcg/act Susp (Mometasone furoate) .... Two puffs each nostril daily 7)  Coreg 3.125 Mg Tabs (Carvedilol) .... Once daily 8)  Lisinopril 5 Mg Tabs (Lisinopril) .[name].. 1 by mouth once  daily 9)  Simvastatin 40 Mg Tabs (Simvastatin) .... 1/2 by mouth once daily 10)  Hydrocodone-acetaminophen 2.5-500 Mg Tabs (Hydrocodone-acetaminophen) .... One by mouth  every 4 hours as needed 11)  Physical Therapy and Occupational Therapy  .... Evaluate and treat 12)  Gaviscon Extra Strength 160-105 Mg Chew (Alum hydroxide-mag carbonate) .... Two times a day  Patient Instructions: 1)  come back fasting: 2)  FLP ---dx CAD 3)  BMP CBC  TSH ----dx hypertension 4)  PSA udip----dx BPH 5)  Please schedule a follow-up appointment in 2 months.

## 2010-06-22 NOTE — Assessment & Plan Note (Signed)
Summary: Pulmonary OV   Copy to:  [name] Primary Provider/Referring Provider:  Dr. Drue Novel  CC:  Followup.  Pt last seen March 2010.  He is here for rx refill for neb meds.  Pt states that his breathing is "pretty good".  He c/o cough and runny nose x several wks.  Cough is prod with minimal clear sputum. Antonio Hester  History of Present Illness: This is a 77 year old, white male with chronic obstructive lung disease, asthmatic bronchitis, and minimal bronchiectasis.  Pt also with cardiomyopathy.  July 29, 2008 10:30 AM last ov was 3/09. no changes made at last ov has a new defibrillator  at [facility]  [name]  AICD and biventric PPM  just placed 3/10 lately more sob with cold weather,  off and on runny nose,  more congested,  more cough,  cough is prod of clear,  no blood,  some nasal bleeding,  no cp,   more edema,  more nasal congestion,  some heartburn,  notes more PND  Oct 12, 2009 11:40 AM Since past year,  has a new AICD/PPM.  This has helped.  Just moved to [facility] assisted living. Now:  notes some cough at night but mucous is clear and minimally productive. Notes some wheezes.  Will note some drooling.  NOt in hosp in past year.  No spells of pna or bronchitis.    Vital Signs:  Patient profile:   77 year old male Weight:      170 pounds O2 Sat:      97 % on Room air Temp:     97.8 degrees F oral Pulse rate:   75 / minute BP sitting:   120 / 68  (left arm)  Vitals Entered By: [name] (Oct 12, 2009 11:37 AM)  O2 Flow:  Room air  "  Preventive Screening-Counseling & Management  Alcohol-Tobacco     Smoking Status: quit > 6 months  Clinical Reports Reviewed:  CXR:  07/29/2008: CXR Results:  abnormal:   Findings: Interval replacement of pacemaker.  The current left- sided pacemaker has four continuous leads.  Stable enlarged cardiac silhouette.  There is a fine nodular pattern in the right lower lobe and likely the left which is slightly increased  from prior. No evidence pneumothorax.  No focal consolidation.   IMPRESSION:   Cardiomegaly and fine air space disease in the lower lobes likely representing mild pulmonary edema.    PFT's:  08/06/2008: DLCO %Predicted:  38 FEF 25/75 %Predicted:  14 FEV1 %Predicted:  42 FVC %Predicted:  60 Post Spirometry FEF 25/75 %Predicted:  12 Post Spirometry FEV1 %Predicted:  37 Post Spirometry FVC %Predicted:  58 RV %Predicted:  65 TLC %Predicted:  66   Current Medications (verified): 1)  Dilantin 100 Mg  Caps (Phenytoin Sodium Extended) .... 2 By Mouth Bid 2)  Warfarin Sodium 5 Mg Tabs (Warfarin Sodium) .... As Directed 3)  Pulmicort 0.25 Mg/89ml Susp (Budesonide) .... Two Times A Day 4)  Atrovent 0.03 %  Soln (Ipratropium Bromide) .... One Vial in Nebulizer Two Times A Day 5)  Oxygen .... 2l Rest and 3 L Exertion 6)  Nasonex 50 Mcg/act  Susp (Mometasone Furoate) .... Two Puffs Each Nostril Daily 7)  Coreg 3.125 Mg Tabs (Carvedilol) .... Once Daily 8)  Lisinopril 5 Mg Tabs (Lisinopril) .Antonio Hester.. 1 By Mouth Once Daily 9)  Simvastatin 40 Mg Tabs (Simvastatin) .... 1/2 By Mouth Once Daily 10)  Hydrocodone-Acetaminophen 2.5-500 Mg Tabs (Hydrocodone-Acetaminophen) .... One By  Mouth  Every 4 Hours As Needed 11)  Physical Therapy and Occupational Therapy .... Evaluate and Treat 12)  Gaviscon Extra Strength 160-105 Mg Chew (Alum Hydroxide-Mag Carbonate) .... Two Times A Day  Allergies (verified): 1)  Penicillin  Past History:  Past medical, surgical, family and social histories (including risk factors) reviewed, and no changes noted (except as noted below).  Past Medical History: Reviewed history from 09/21/2009 and no changes required. CAD  Atrial fibrillation s/p ablation 07-2007 h/o complete heart block and pacemaker dependence , s/p defib change 06-2008 Non-ischemic cardiomyopathy Coumadin f/u at [name] office  COPD BPH SZ d/o (sees [name], last OV  2006) Hyperlipidemia Hypertension Hx of ABSCESS, LUNG   GERD-- severe esophagitis per EGD 2007 osteopenia --per DEXA  6/11 osteoarthritis MDs--Dr Love, cardilogy ([name] @ [facility], [name] @ [facility]), pulmonary [name] , urology  name?, GI [name]  Past Surgical History: Reviewed history from 07/29/2008 and no changes required. Pacemaker: (St. Jude) Defibrillator- 06/2008  Past Pulmonary History:  Pulmonary History: 08/05/2008:  PFT's (Emphysema) Pre-Spirometry  FVC     Value: 2.73 L/min   Pred: 4.51 L/min     % Pred: 60 % FEV1     Value: 1.24 L     Pred: 2.93 L     % Pred: 42 % FEV1/FVC   Value: 45 %     Pred: 66 %     FEF 25-75   Value: 0.35 L/min   Pred: 2.48 L/min     % Pred: 14 %  Post-Spirometry  FVC     Value: 2.62 L/min   Pred: 4.51 L/min     % Pred: 58 % FEV1     Value: 1.09 L     Pred: 2.93 L     % Pred: 37 % FEV1/FVC   Value: 42 %     Pred: 66 %     FEF 25-75   Value: 0.29 L/min   Pred: 2.48 L/min     % Pred: 12 %  Lung Volumes  TLC     Value: 4.54 L   % Pred: 66 % RV     Value: 1.81 L   % Pred: 65 % DLCO     Value: 7.8 %   % Pred: 38 % DLCO/VA   Value: 2.09 %   % Pred: 62 %  Family History: Reviewed history from 04/30/2007 and no changes required. Both parents with carcinoma and heart disease  Social History: Reviewed history from 02/16/2007 and no changes required. Former Smoke Married Smoking Status:  quit > 6 months  Review of Systems       The patient complains of shortness of breath with activity.  The patient denies shortness of breath at rest, productive cough, non-productive cough, coughing up blood, chest pain, irregular heartbeats, acid heartburn, indigestion, loss of appetite, weight change, abdominal pain, difficulty swallowing, sore throat, tooth/dental problems, headaches, nasal congestion/difficulty breathing through nose, sneezing, itching, ear ache, anxiety, depression, hand/feet swelling, joint stiffness or pain, rash, change in  color of mucus, and fever.     Physical Exam  Additional Exam:  Gen: Pleasant, well-nourished, in no distress , normal affect ENT: no lesions, no post nasal drip Neck: No JVD, no TMG, no carotid bruits Lungs: No use of accessory muscles, no dullness to percussion ,  bibasilar rales,  poor airflow Cardiovascular: RRR, heart sounds normal, no murmurs or gallops, peripheral edema 3+ both LE Abdomen: soft and non-tender, no HSM,  BS normal Musculoskeletal: No deformities, no cyanosis or clubbing Neuro: alert, non-focal     Pulmonary Function Test Date: 10/12/2009 11:49 AM Gender: Male  Pre-Spirometry FVC    Value: 4.50 L/min   % Pred: 101.50 % FEV1    Value: 2.41 L     Pred: 3.20 L     % Pred: 75.40 % FEV1/FVC  Value: 53.55 %     % Pred: 74.20 %  Impression & Recommendations:  Problem # 1:  COPD (ICD-496) Assessment Unchanged  Stable COPD plan: maintain inhaled medications and oxygen as prescribed  Medications Added to Medication List This Visit: 1)  Atrovent 0.03 % Soln (Ipratropium bromide) .... One vial in nebulizer two times a day 2)  Nasonex 50 Mcg/act Susp (Mometasone furoate) .... Two puffs each nostril daily  Complete Medication List: 1)  Dilantin 100 Mg Caps (Phenytoin sodium extended) .... 2 by mouth bid 2)  Warfarin Sodium 5 Mg Tabs (Warfarin sodium) .... As directed 3)  Pulmicort 0.25 Mg/53ml Susp (Budesonide) .... Two times a day 4)  Atrovent 0.03 % Soln (Ipratropium bromide) .... One vial in nebulizer two times a day 5)  Oxygen  .... 2l rest and 3 l exertion 6)  Nasonex 50 Mcg/act Susp (Mometasone furoate) .... Two puffs each nostril daily 7)  Coreg 3.125 Mg Tabs (Carvedilol) .... Once daily 8)  Lisinopril 5 Mg Tabs (Lisinopril) .Antonio Hester.. 1 by mouth once daily 9)  Simvastatin 40 Mg Tabs (Simvastatin) .... 1/2 by mouth once daily 10)  Hydrocodone-acetaminophen 2.5-500 Mg Tabs (Hydrocodone-acetaminophen) .... One by mouth  every 4 hours as needed 11)  Physical  Therapy and Occupational Therapy  .... Evaluate and treat 12)  Gaviscon Extra Strength 160-105 Mg Chew (Alum hydroxide-mag carbonate) .... Two times a day  Other Orders: Spirometry w/Graph (94010) Est. Patient Level III ([address]) Prescription Created Electronically [phone])  Patient Instructions: 1)  No change in medications 2)  Return 12 months, sooner if needed  Prescriptions: ATROVENT 0.03 %  SOLN (IPRATROPIUM BROMIDE) one vial in nebulizer two times a day  #3 month x 4   Entered and Authorized by:   Storm Frisk MD   Signed by:   Storm Frisk MD on 10/12/2009   Method used:   Print then Give to Patient   RxID:   [id number] NASONEX 50 MCG/ACT  SUSP (MOMETASONE FUROATE) Two puffs each nostril daily Brand medically necessary #1 x 6   Entered and Authorized by:   Storm Frisk MD   Signed by:   Storm Frisk MD on 10/12/2009   Method used:   Electronically to        [facility] [phone]* (retail)       [address]       [place], [place]  [address]       Ph: [phone]       Fax: [phone]   RxID:   [id number] PULMICORT 0.25 MG/2ML SUSP (BUDESONIDE) two times a day  #60 x 6   Entered and Authorized by:   Storm Frisk MD   Signed by:   Storm Frisk MD on 10/12/2009   Method used:   Electronically to        [facility] [phone]* (retail)       [address]       [place], [place]  [address]       Ph: [phone]       Fax: [phone]  RxID:   [id number]     CardioPerfect Spirometry  ID: [id number] Patient: Antonio Hester, Antonio Hester DOB: [birth year] Age: 77 Years Old Sex: Male Race: White Physician: Jose E. Paz MD Height: 72 Weight: 170 Status: Unconfirmed Past Medical History:  CAD  Atrial fibrillation s/p ablation 07-2007 h/o complete heart block and pacemaker dependence , s/p defib change 06-2008 Non-ischemic cardiomyopathy Coumadin f/u at [name] office  COPD BPH SZ d/o (sees [name], last OV  2006) Hyperlipidemia Hypertension Hx of ABSCESS, LUNG   GERD-- severe esophagitis per EGD 2007 osteopenia --per DEXA  6/11 osteoarthritis MDs--Dr Love, cardilogy ([name] @ [facility], Dr [facility] @ [facility]), pulmonary [name] , urology  name?, GI [name] Recorded: 10/12/2009 11:49 AM  Parameter  Measured Predicted %Predicted FVC     4.50        4.44        101.50 FEV1     2.41        3.20        75.40 FEV1%   53.55        72.16        74.20 PEF    3.85        7.98        48.20   Interpretation:

## 2010-06-22 NOTE — Progress Notes (Signed)
Summary: med list  Phone Note From Other Clinic   Caller: anna w/ [facility] Call For: [facility] Summary of Call: due to a request by medicare (audit) [facility] needs pt's med list "especially re: albuterol. fax to attn: Tobi Bastos [phone]. contact # [phone] x [phone] Initial call taken by: Tivis Ringer, CNA,  December 30, 2009 11:53 AM  Follow-up for Phone Call        PW, are you okay for [place] to send med list as requested to this healthcare company? Thanks.Reynaldo Minium CMA  December 30, 2009 3:10 PM   Additional Follow-up for Phone Call Additional follow up Details #1::        yes Additional Follow-up by: Storm Frisk MD,  December 30, 2009 4:35 PM    Additional Follow-up for Phone Call Additional follow up Details #2::    Pt's med list faxed to [facility], [phone]. Follow-up by: Michel Bickers CMA,  December 30, 2009 4:56 PM

## 2010-06-22 NOTE — Progress Notes (Signed)
Summary: rx clarification-LMTCB x 2  Phone Note From Pharmacy   Caller: anna w/ [facility] Call For: [facility]  Summary of Call: needs clarification re: duo neb which is on med list vs albuterol and ipratropium, a rx they recieved recently from [name] per caller.  which should be filled? anna # [phone] x [phone]  Follow-up for Phone Call        Spoke with Tobi Bastos at [facility].  She states that on the med list that was faxed over, albuterol is missing.  She states that pt is still getting this filled though.  I advised that as of last ov in May, pt should just be using atrovent and budesonide in nebulizer.  She advised that clinically, she can not advise pt to stop albuterol.  I tried to call pt to find out what is going on.  LMTCB Follow-up by: [name],  January 01, 2010 2:15 PM  Additional Follow-up for Phone Call Additional follow up Details #1::        ATC [facility] for more information needed; business is closed.Reynaldo Minium CMA  January 05, 2010 10:27 AM   ATC patient, LMOM TCB at both numbers in the patient banner; both numbers have been disconnected.  called the [phone] and LMOM TCB.  why is patient still taking albuterol?  per EMR Dr Drue Novel dc'd this at the 06-24-08 ov.  he is to only be taking pulmicort and budesonide. Boone Master CNA/MA  January 06, 2010 5:31 PM     Additional Follow-up for Phone Call Additional follow up Details #2::    called and spoke with pt---home number is [phone]---pt stated that he has not been using the albuterol in his neb just the atrovent and the budesonide---he is needing refills of these meds sent to him.  called and spoke with [place] and she is aware that pt is no longer using the albuterol so she will stop pt from calling and ordering this. Randell Loop [facility]  January 07, 2010 4:17 PM

## 2010-06-22 NOTE — Progress Notes (Signed)
Summary: refill request/ pharm calling  Phone Note From Pharmacy   Caller: bob w/ [facility] Call For: triage  Summary of Call: rx has run out for budesonide. please advise. [facility] [phone] Initial call taken by: Tivis Ringer, CNA,  Sep 29, 2009 10:49 AM  Follow-up for Phone Call        advised that pt has not been seen since 07/2008 and we have been attempting to get in contact with the pt to advise him he needs an appt.  ATC pt home number but no answwer no voicemail.  work number is no longer a [employer].  There is also another phone note about this same thing started on 09/24/09, so I will sign off onthis note and continue on the other phone note. Carron Curie CMA  Sep 29, 2009 11:14 AM

## 2010-06-22 NOTE — Progress Notes (Signed)
Summary: nos appt  Phone Note Call from Patient   Caller: [email or web address]  Call For: [facility] Summary of Call: LMTCB x2 to rsc nos from 5/19. Initial call taken by: Darletta Moll,  Oct 09, 2009 2:49 PM

## 2010-06-22 NOTE — Miscellaneous (Signed)
Summary: PT Order/[facility]  PT Order/[facility]   Imported By: Lanelle Bal 12/29/2009 13:48:01  _____________________________________________________________________  External Attachment:    Type:   Image     Comment:   External Document

## 2010-06-22 NOTE — Medication Information (Signed)
Summary: [facility]  [facility]   Imported By: Lester Weidman 07/14/2009 08:59:09  _____________________________________________________________________  External Attachment:    Type:   Image     Comment:   External Document

## 2010-06-22 NOTE — Miscellaneous (Signed)
Summary: OT Discharge/[facility]  OT Discharge/[facility]   Imported By: Lanelle Bal 11/30/2009 13:14:23  _____________________________________________________________________  External Attachment:    Type:   Image     Comment:   External Document

## 2010-06-22 NOTE — Assessment & Plan Note (Signed)
Summary: DOG SCRATCH ON RIGHT ARM//KN   Vital Signs:  Patient profile:   77 year old male Weight:      170 pounds Temp:     98.1 degrees F oral BP sitting:   120 / 70  (left arm)  Vitals Entered By: Doristine Devoid (November 19, 2009 3:53 PM) CC: dog sctrach 2 days ago now redness    History of Present Illness: 77 yo man here today for a dog scratch 2 days ago.  area now red.  area was washed w/ peroxide and covered w/ alginate wound cream.  went to nurse this AM at assisted living facility and was told to be seen.  has surrounding redness.  denies pain.  no fevers.  Allergies (verified): 1)  Penicillin  Review of Systems      See HPI  Physical Exam  General:  alert and well-developed.   Skin:  1 inch x 1 inch skin tear on R forearm- alginate wound cream has left thick sticky film over wound.  surrounding ring of redness extending  ~1 inch from wound.  scrubbed off alginate gel and wound base clean.   Impression & Recommendations:  Problem # 1:  ABRASION, LOWER ARM, INFECTED (ICD-913.1) Assessment New  pt w/ PCN allergy.  will start Clinda due to possibility of anaerobic infxn given dog scratch.  wound clean and dressed, red flags reviewed.  pt expressed understanding.  Orders: Prescription Created Electronically [id number])  Complete Medication List: 1)  Dilantin 100 Mg Caps (Phenytoin sodium extended) .... 2 by mouth bid 2)  Warfarin Sodium 5 Mg Tabs (Warfarin sodium) .... As directed 3)  Pulmicort 0.25 Mg/38ml Susp (Budesonide) .... Two times a day 4)  Atrovent 0.03 % Soln (Ipratropium bromide) .... One vial in nebulizer two times a day 5)  Oxygen  .... 2l rest and 3 l exertion 6)  Nasonex 50 Mcg/act Susp (Mometasone furoate) .... Two puffs each nostril daily 7)  Coreg 3.125 Mg Tabs (Carvedilol) .... Once daily 8)  Lisinopril 5 Mg Tabs (Lisinopril) .[name].. 1 by mouth once daily 9)  Simvastatin 40 Mg Tabs (Simvastatin) .... 1/2 by mouth once daily 10)  Hydrocodone-acetaminophen  2.5-500 Mg Tabs (Hydrocodone-acetaminophen) .... One by mouth  every 4 hours as needed 11)  Physical Therapy and Occupational Therapy  .... Evaluate and treat 12)  Gaviscon Extra Strength 160-105 Mg Chew (Alum hydroxide-mag carbonate) .... Two times a day 13)  Combivent 18-103 Mcg/act Aero (Ipratropium-albuterol) .[name].. 1-2 puffs every 4 hours as needed 14)  Clindamycin Hcl 300 Mg Caps (Clindamycin hcl) .[name].. 1 tab by mouth three times a day x7 days  Patient Instructions: 1)  Please follow up in 1 week for wound recheck 2)  Take the clindamycin as directed- take with food to avoid upset stomach 3)  Keep the area clean and dry- DO NOT use the wound gel.  Soap and water and peroxide will be enough 4)  If the redness worsens or spreads- please call 5)  Hang in there! Prescriptions: CLINDAMYCIN HCL 300 MG CAPS (CLINDAMYCIN HCL) 1 tab by mouth three times a day x7 days  #21 x 0   Entered and Authorized by:   Neena Rhymes MD   Signed by:   Neena Rhymes MD on 11/19/2009   Method used:   Electronically to        [facility] [phone]* (retail)       [address]       [place], [place]  [address]       Ph: [phone]       Fax: [phone]   RxID:   [id number]

## 2010-06-22 NOTE — Letter (Signed)
Summary: Rx gelnique  --- Urology  [facility]   Imported By: Sherian Rein 11/16/2009 14:57:16  _____________________________________________________________________  External Attachment:    Type:   Image     Comment:   External Document

## 2010-06-22 NOTE — Miscellaneous (Signed)
Summary: OT Discharge & PT Recert/[facility]  OT Discharge & PT Recert/[facility]   Imported By: Lanelle Bal 03/26/2010 09:10:33  _____________________________________________________________________  External Attachment:    Type:   Image     Comment:   External Document

## 2010-06-22 NOTE — Letter (Signed)
Summary: Primary Care Consult Scheduled Letter  [facility]  [address] [place], [place] [address]   Phone: [phone]  Fax: [phone]      09/23/2009 MRN: [id number]  [facility] [address] [place], [place]  [address]    Dear Mr. RAWLES,    We have scheduled an appointment for you.  At the recommendation of Dr. Willow Ora, we have scheduled you a consult with Dr. Harriette Bouillon of [facility] on 10-05-2009 arrive by 1:30pm.  Their address is [address], [place] [address]. The office phone number is [phone].  If this appointment day and time is not convenient for you, please feel free to call the office of the doctor you are being referred to at the number listed above and reschedule the appointment.    It is important for you to keep your scheduled appointments. We are here to make sure you are given good patient care.   Thank you,    Renee, Patient Care Coordinator  at [facility]

## 2010-06-22 NOTE — Miscellaneous (Signed)
Summary: ST Order & Recert/[facility]  ST Order & Recert/[facility]   Imported By: Lanelle Bal 12/09/2009 10:00:07  _____________________________________________________________________  External Attachment:    Type:   Image     Comment:   External Document

## 2010-06-22 NOTE — Progress Notes (Signed)
Summary: orders for PT and OT  Phone Note From Other Clinic   Caller: [facility] Mgr from [place] [phone] ext [phone] Summary of Call: --Rehab Mgr spoke with pt this am who c/o back pain, ongoing issue that pt has not been able to address due to his wife health.  She is requesting orders for Physical therapy to eval and treat, orders for Occupational therapy to make sure it is safe for pt to do ADL'S. If ok with doctor,  can fax PT and OT to eval and treat to [phone]  .Kandice Hams  August 04, 2009 3:30 PM  Initial call taken by: Kandice Hams,  August 04, 2009 3:30 PM  Follow-up for Phone Call        yes please Nolon Rod. Paz MD  August 05, 2009 9:58 AM   FAXED .Kandice Hams  August 05, 2009 11:06 AM  Follow-up by: Kandice Hams,  August 05, 2009 11:06 AM    New/Updated Medications: * PHYSICAL THERAPY AND OCCUPATIONAL THERAPY evaluate and treat Prescriptions: PHYSICAL THERAPY AND OCCUPATIONAL THERAPY evaluate and treat  #1 x 0   Entered by:   Kandice Hams   Authorized by:   Nolon Rod. Paz MD   Signed by:   Kandice Hams on 08/05/2009   Method used:   Print then Give to Patient   RxID:   [id number]

## 2010-06-22 NOTE — Progress Notes (Signed)
Summary: unable to leave message  Phone Note Outgoing Call Call back at Home Phone [phone]   Reason for Call: Discuss lab or test results Details for Reason: advise patient: x-rays of the spine normal. Signed by [facility] E. Paz MD on 10/18/2009 at 8:40 AM Summary of Call: no answer, unable to leave message, letter mailed.......Marland KitchenShary Decamp  Oct 20, 2009 2:04 PM

## 2010-06-22 NOTE — Letter (Signed)
Summary: CMN for Oxygen/[facility]  CMN for Oxygen/[facility]   Imported By: Sherian Rein 03/08/2010 14:09:57  _____________________________________________________________________  External Attachment:    Type:   Image     Comment:   External Document

## 2010-06-22 NOTE — Letter (Signed)
Summary: CMN for Oxygen/[facility]  CMN for Oxygen/[facility]   Imported By: Sherian Rein 10/21/2009 10:06:33  _____________________________________________________________________  External Attachment:    Type:   Image     Comment:   External Document

## 2010-06-22 NOTE — Miscellaneous (Signed)
Summary: Order to d/c albuterol/[facility]  Order to d/c albuterol/[facility]   Imported By: Sherian Rein 01/20/2010 11:37:11  _____________________________________________________________________  External Attachment:    Type:   Image     Comment:   External Document

## 2010-06-22 NOTE — Progress Notes (Signed)
Summary: Needs to schedule OV with PW for refills  Phone Note Outgoing Call   Call placed by: Gweneth Dimitri RN,  Sep 24, 2009 12:02 PM Call placed to: Patient Summary of Call: Received rx request for Budesonide from [facility].  However, pt was last seen 07/2008 by PW and told to follow up in 6 months.  Pt has no pending appts.  ATC pt's home number - no answer and unable to leave message - to inform him of [facility] and schedule OV with PW.  Once ov scheduled, will send in enough med to last until ov.  ATC pt's work number however number is no longer a working number.  WCB. Initial call taken by: Gweneth Dimitri RN,  Sep 24, 2009 12:06 PM  Follow-up for Phone Call        ATC pt's home number - no answer and unable to leave message.  WCB Crystal Jones RN  Sep 24, 2009 4:04 PM  (see phone note from 09-29-09) Received a call from [facility] about pt budesonide. I advised pt needs an appt for refills. I ATC home number, no answer, no voicemail. Also ATC work number but number disconnected. I found pt daughter, Oswald Hillock,  # [phone] in another phone note and LMTCB to get pt scheduled for an appt. Crystal also aware.  Carron Curie CMA  Sep 29, 2009 11:16 AM   Additional Follow-up for Phone Call Additional follow up Details #1::        OV with PW scheduled yesterday with schedulers for Oct 08, 2009 at 945.  Budesonide rx faxed to [facility] to last pt until OV.  ATC pt's home number - no answer and unable to leave message.  Called pt's daughter Kathie Rhodes at above number - LMOMTCB to inform pt that rx was sent to last until ov but he must keep that appt for additional refills.  Gweneth Dimitri RN  Sep 30, 2009 10:13 AM     Additional Follow-up for Phone Call Additional follow up Details #2::    Pt's daughter, Kathie Rhodes, is aware RX has been sent to pharmacy and that pt must keep his appt for any further refills.Michel Bickers CMA  Sep 30, 2009 11:51 AM  noted, thanks! Gweneth Dimitri RN  Sep 30, 2009 11:59  AM   Prescriptions: PULMICORT 0.25 MG/2ML SUSP (BUDESONIDE) two times a day  #60 x 0   Entered by:   Gweneth Dimitri RN   Authorized by:   Storm Frisk MD   Signed by:   Gweneth Dimitri RN on 09/30/2009   Method used:   Faxed to ...       [facility] (retail)       [address]       [place], [place]  [address]       Ph: [phone]       Fax: [phone]   RxID:   [id number]

## 2010-06-22 NOTE — Miscellaneous (Signed)
Summary: OT Discharge/[facility]  OT Discharge/[facility]   Imported By: Lanelle Bal 12/09/2009 09:59:22  _____________________________________________________________________  External Attachment:    Type:   Image     Comment:   External Document

## 2010-06-24 NOTE — Medication Information (Signed)
Summary: [facility]  [facility]   Imported By: Lester [place] 05/28/2010 08:13:31  _____________________________________________________________________  External Attachment:    Type:   Image     Comment:   External Document

## 2010-06-24 NOTE — Assessment & Plan Note (Addendum)
Summary: YEARLY,MEDICARE & BCBS--needs to ask abt flu shot/RH.....   Vital Signs:  Patient profile:   77 year old male Height:      72 inches Weight:      170.38 pounds Pulse rate:   80 / minute Pulse rhythm:   regular BP sitting:   128 / 86  (left arm) Cuff size:   large  Vitals Entered By: Army Fossa CMA (June 16, 2010 1:14 PM) CC: CPX, not fasting  Comments discuss hernia [facility] [address]  flu shot  not had colonoscopy   History of Present Illness: Here for Medicare AWV:  1.   Risk factors based on Past M, S, F history: reviewed  2.   Physical Activities: has a trainer , exercises 3/week  3.   Depression/mood: denies , no problems noted. Just concerned about his wife (dementia) 4.   Hearing: uses hearing aids, rec to check w/ audiologist routinely  5.   ADL's: lives at a ALF, getting all the help he needs so far  6.   Fall Risk: increase risk fue to general fragility, prevention discussed , no recent falls . Has a                      cane  7.   Home Safety: does feel safe at home  8.   Height, weight, &visual acuity: see VS, uses glasses  9.   Counseling: yes  10.   Labs ordered based on risk factors: yes  11.           Referral Coordination, if needed  12.           Care Plan, see a/p  13.            Cognitive Assessment: cognition and memory are normal. Motor skills decreased but stable   in addition, we discussed the following  ED--was prescribed Viagra by his urologist, he has already discussed with cardiology. Knows interactions with nitroglycerin  which he has not taken in a long time.  R hand tremor on off, x months;  L hand unaffected, occasionally does have head tremor.  CAD , Atrial fibrillation : per cards , sees them regulalrly    COPD-- good medication compliance w/ O2 , uses inhalers PRN  BPH-- saw urology 6-11, they prescribed gelnique  for  BPH, helped x a while but then stopped helping . not taking any meds at present    inguinal  hernia-- was referred to surgery 09/2009, has not seen them yet  SZ d/o -- asx,  self d/c dilantin  ~ 10-2009, has not seen  [name] in years       Preventive Screening-Counseling & Management  Alcohol-Tobacco     Smoking Status: never  Caffeine-Diet-Exercise     Does Patient Exercise: yes     Type of exercise: gym     Times/week: 3  Current Medications (verified): 1)  Dilantin 100 Mg  Caps (Phenytoin Sodium Extended) .... 2 By Mouth Bid 2)  Warfarin Sodium 5 Mg Tabs (Warfarin Sodium) .... As Directed 3)  Pulmicort 0.25 Mg/20ml Susp (Budesonide) .... Two Times A Day 4)  Atrovent 0.03 %  Soln (Ipratropium Bromide) .... One Vial in Nebulizer Two Times A Day 5)  Oxygen .... 2l Rest and 3 L Exertion 6)  Nasonex 50 Mcg/act  Susp (Mometasone Furoate) .... Two Puffs Each Nostril Daily 7)  Coreg 3.125 Mg Tabs (Carvedilol) .... Once Daily 8)  Lisinopril  5 Mg Tabs (Lisinopril) .[name].. 1 By Mouth Once Daily 9)  Simvastatin 40 Mg Tabs (Simvastatin) .... 1/2 By Mouth Once Daily 10)  Gaviscon Extra Strength 160-105 Mg Chew (Alum Hydroxide-Mag Carbonate) .... Two Times A Day 11)  Combivent 18-103 Mcg/act Aero (Ipratropium-Albuterol) .[name].. 1-2 Puffs Every 4 Hours As Needed 12)  Albuterol Sulfate 0.083 % Neb Sol (Albuterol Sulfate) .... Use One Vial Every 4 To 6 Hours As Needed Via Nebulizer  Allergies (verified): 1)  Penicillin  Past History:  Past Medical History: CAD  Atrial fibrillation s/p ablation 07-2007 h/o complete heart block and pacemaker dependence , s/p defib change 06-2008 Non-ischemic cardiomyopathy Coumadin f/u at [name] office  ----------------------------------------- COPD BPH SZ d/o (sees [name], last OV 2006) Hyperlipidemia Hypertension Hx of ABSCESS, LUNG   GERD-- severe esophagitis per EGD 2007 osteopenia --per DEXA  6/11 osteoarthritis MDs--Dr Love, cardilogy ([name] @ [facility], [name] @ [facility]), pulmonary [name] , urology  name?, GI [name]  Past  Surgical History: Reviewed history from 07/29/2008 and no changes required. Pacemaker: (St. Jude) Defibrillator- 06/2008  Social History: lives at [facility], has an apartment, lives w/ wife, has a dog  children x 2 , son ([place]) and daughter Jeanice Lim) Former Smoke ETOH-0- wine sometimes Smoking Status:  never Does Patient Exercise:  yes  Review of Systems CV:  Denies chest pain or discomfort and swelling of feet. Resp:  Denies coughing up blood; SOB @ baseline  some cough, mostly at night  . GI:  Denies bloody stools, diarrhea, nausea, and vomiting. GU:  Denies dysuria and hematuria.  Physical Exam  General:  alert and well-developed.  since older than his age Neck:  no masses.  normal carotid pulses Lungs:  normal respiratory effort, no intercostal retractions, and no accessory muscle use.  no rales, no distress. BS slightly  decreased Heart:  heart rate seems regular   Abdomen:  soft, non-tender, no distention, no masses, no guarding, and no rigidity.   Extremities:  no lower extremity edema Psych:  Oriented X3, memory intact for recent and remote, not anxious appearing, and not depressed appearing.     Impression & Recommendations:  Problem # 1:  ROUTINE GENERAL MEDICAL EXAM@HEALTH  CARE FACL (ICD-V70.0)  chart reviewed,  Td 8-10 pneumonia shot 8-10 flu shot-- recommended  Shingles immunization--RX provided, benefits discussed     PSA 6-11 @ urology   does not have a colonoscopy, he has seen GI before, on  11- 2008 they were contemplating colon cancer screening , recognizing his multiple comorbidities. I talked to the patient about this issue, offered him a referral. Not ready for a Cscope at this point   Diet and exercise discussed  Orders: Medicare -1st Annual Wellness Visit [phone])  Problem # 2:  ERECTILE DYSFUNCTION, ORGANIC (ICD-607.84)  Plans to try Viagra. Aware off side effects and interactions  His updated medication list for this problem  includes:    Viagra 100 Mg Tabs (Sildenafil citrate) .[name]... 1/2 a day rx by urology  Problem # 3:  HYPERLIPIDEMIA (ICD-272.4)  at goal  His updated medication list for this problem includes:    Simvastatin 40 Mg Tabs (Simvastatin) .[name]... 1/2 by mouth once daily    Labs Reviewed: SGOT: 16 (04/05/2006)   SGPT: 14 (04/05/2006)   HDL:41.00 (10/12/2009)  LDL:80 (10/12/2009)  Chol:131 (10/12/2009)  Trig:50.0 (10/12/2009)  Orders: TLB-ALT (SGPT) (84460-ALT) TLB-AST (SGOT) (84450-SGOT) Specimen Handling (98119)  Problem # 4:  CORONARY ARTERY DISEASE (ICD-414.00) doing well  His updated medication list for this problem includes:    Coreg 3.125 Mg Tabs (Carvedilol) ..... Once daily    Lisinopril 5 Mg Tabs (Lisinopril) .[name]... 1 by mouth once daily  Problem # 5:  SEIZURE DISORDER (ICD-780.39) self d/c  Dilantin  ~ 6-11 reason?  has not seen Dr. Sandria Manly  in years. Now presents with tremor in the right hand. Plan: Refer to Dr. Sandria Manly for re-evaluation The following medications were removed from the medication list:    Dilantin 100 Mg Caps (Phenytoin sodium extended) .[name]... 2 by mouth bid  Problem # 6:  HYPERTENSION (ICD-401.9)  at goal  His updated medication list for this problem includes:    Coreg 3.125 Mg Tabs (Carvedilol) ..... Once daily    Lisinopril 5 Mg Tabs (Lisinopril) .[name]... 1 by mouth once daily    BP today: 128/86 Prior BP: 120/70 (11/19/2009)  Labs Reviewed: K+: 4.2 (10/12/2009) Creat: : 1.1 (10/12/2009)   Chol: 131 (10/12/2009)   HDL: 41.00 (10/12/2009)   LDL: 80 (10/12/2009)   TG: 50.0 (10/12/2009)  Orders: Venipuncture (62130) TLB-BMP (Basic Metabolic Panel-BMET) (80048-METABOL) TLB-CBC Platelet - w/Differential (85025-CBCD) Specimen Handling (86578)  Problem # 7:  BENIGN PROSTATIC HYPERTROPHY (ICD-600.00) chronic symptoms, followup by urology.      Problem # 8:  INGUINAL HERNIA, RIGHT (ICD-550.90)  re-refer to surgery  Orders: Surgical Referral  (Surgery)  Problem # 9:  OSTEOPENIA (ICD-733.90) bone density test 11/2008 showed mild  osteopenia was recommended calcium and vitamin D. We also discussed other treatments today. we agreed to re check a DEXA 06-2010    Orders: T-Vitamin D (25-Hydroxy) [phone]) Specimen Handling (13244)  Complete Medication List: 1)  Warfarin Sodium 5 Mg Tabs (Warfarin sodium) .... As directed 2)  Pulmicort 0.25 Mg/74ml Susp (Budesonide) .... Two times a day 3)  Atrovent 0.03 % Soln (Ipratropium bromide) .... One vial in nebulizer two times a day 4)  Oxygen  .... 2l rest and 3 l exertion 5)  Nasonex 50 Mcg/act Susp (Mometasone furoate) .... Two puffs each nostril daily 6)  Coreg 3.125 Mg Tabs (Carvedilol) .... Once daily 7)  Lisinopril 5 Mg Tabs (Lisinopril) .[name].. 1 by mouth once daily 8)  Simvastatin 40 Mg Tabs (Simvastatin) .... 1/2 by mouth once daily 9)  Gaviscon Extra Strength 160-105 Mg Chew (Alum hydroxide-mag carbonate) .... Two times a day 10)  Combivent 18-103 Mcg/act Aero (Ipratropium-albuterol) .[name].. 1-2 puffs every 4 hours as needed 11)  Albuterol Sulfate 0.083 % Neb Sol (Albuterol sulfate) .... Use one vial every 4 to 6 hours as needed via nebulizer 12)  Zostavax 01027 Unt/0.72ml Solr (Zoster vaccine live) .... X 1 as directed 13)  Viagra 100 Mg Tabs (Sildenafil citrate) .... 1/2 a day rx by urology  Other Orders: T- * Misc. Laboratory test [phone]) Influenza Vaccine MCR [phone]) Neurology Referral (Neuro)  Patient Instructions: 1)  recommend a shingles  shot, see  prescription 2)  Please schedule a follow-up appointment in 6 months .  Prescriptions: ZOSTAVAX 74259 UNT/0.65ML SOLR (ZOSTER VACCINE LIVE) x 1 as directed  #1 x 1   Entered and Authorized by:   Nolon Rod. Kekoa Fyock MD   Signed by:   Nolon Rod. Jovanna Hodges MD on 06/16/2010   Method used:   Print then Give to Patient   RxID:   [id number]    Orders Added: 1)  Venipuncture [41660] 2)  TLB-BMP (Basic Metabolic Panel-BMET)  [80048-METABOL] 3)  TLB-ALT (SGPT) [84460-ALT] 4)  TLB-AST (SGOT) [84450-SGOT] 5)  TLB-CBC Platelet -  w/Differential [85025-CBCD] 6)  T- * Misc. Laboratory test [99999] 7)  T-Vitamin D (25-Hydroxy) [phone] 8)  Influenza Vaccine MCR [00025] 9)  Specimen Handling [99000] 10)  Neurology Referral [Neuro] 11)  Est. Patient Level IV [84696] 12)  Surgical Referral [Surgery] 13)  Medicare -1st Annual Wellness Visit [G0438]   Immunizations Administered:  Influenza Vaccine # 1:    Vaccine Type: Fluvax MCR    Site: right deltoid    Mfr: Sanofi Pasteur    Dose: 0.5 ml    Route: IM    Given by: Army Fossa CMA    Exp. Date: 11/20/2010    Lot #: [id number]   Immunizations Administered:  Influenza Vaccine # 1:    Vaccine Type: Fluvax MCR    Site: right deltoid    Mfr: Sanofi Pasteur    Dose: 0.5 ml    Route: IM    Given by: Army Fossa CMA    Exp. Date: 11/20/2010    Lot #: [id number]   Risk Factors:  Tobacco use:  never Alcohol use:  yes Exercise:  yes    Times per week:  3    Type:  gym     Appended Document: YEARLY,MEDICARE & BCBS--needs to ask abt flu shot/RH.....     Clinical Lists Changes  Observations: Added new observation of BMI: 23.19  (06/24/2010 7:32) Added new observation of WEIGHT: 170.38 lb (06/24/2010 7:32)       Vital Signs:  Patient Profile:   77 Years Old Male Height:     72 inches Weight:      170.38 pounds BMI:     23.19

## 2010-06-24 NOTE — Miscellaneous (Signed)
Summary: Nebulizer & Meds/[facility]  [facility]   Imported By: Sherian Rein 06/01/2010 08:49:17  _____________________________________________________________________  External Attachment:    Type:   Image     Comment:   External Document

## 2010-06-24 NOTE — Miscellaneous (Signed)
Summary: Therapy Order/[facility]  Therapy Order/[facility]   Imported By: Maryln Gottron 06/16/2010 14:54:37  _____________________________________________________________________  External Attachment:    Type:   Image     Comment:   External Document

## 2010-06-24 NOTE — Miscellaneous (Signed)
Summary: PT Eval Order/[facility]  PT Eval Order/[facility]   Imported By: Lanelle Bal 05/04/2010 10:38:20  _____________________________________________________________________  External Attachment:    Type:   Image     Comment:   External Document

## 2010-06-24 NOTE — Miscellaneous (Signed)
Summary: PT Orders & Eval/[facility]  PT Orders & Eval/[facility]   Imported By: Lanelle Bal 05/20/2010 13:44:33  _____________________________________________________________________  External Attachment:    Type:   Image     Comment:   External Document

## 2010-06-30 NOTE — Progress Notes (Signed)
Summary: lab results   Phone Note Outgoing Call   Summary of Call: CBC show mild stable thrombocytopenia--- observation advised patient labs within normal except for the vitamin D which is low Recommend ergocalciferol 50,000 units weekly for 3 months.  Call prescription Christal Lagerstrom E. Vernie Piet MD  June 20, 2010 11:29 AM   Follow-up for Phone Call        Left message for pt to call back. Army Fossa CMA  June 21, 2010 9:02 AM   Additional Follow-up for Phone Call Additional follow up Details #1::        left message for pt to call back. Army Fossa CMA  June 22, 2010 9:17 AM     Additional Follow-up for Phone Call Additional follow up Details #2::    I spoke w/ pt he is aware. Army Fossa CMA  June 23, 2010 10:19 AM   New/Updated Medications: VITAMIN D (ERGOCALCIFEROL) 50000 UNIT CAPS (ERGOCALCIFEROL) 1 by mouth once weekly for 3 months. Prescriptions: VITAMIN D (ERGOCALCIFEROL) 50000 UNIT CAPS (ERGOCALCIFEROL) 1 by mouth once weekly for 3 months.  #4 x 2   Entered by:   Army Fossa CMA   Authorized by:   Nolon Rod. Tadao Emig MD   Signed by:   Army Fossa CMA on 06/23/2010   Method used:   Electronically to        [facility] [phone]* (retail)       [address]       [place], [place]  [address]       Ph: [phone]       Fax: [phone]   RxID:   [id number]

## 2010-06-30 NOTE — Miscellaneous (Signed)
Summary: PT Orders & Recert/[facility]  PT Orders & Recert/[facility]   Imported By: Lanelle Bal 06/21/2010 11:57:05  _____________________________________________________________________  External Attachment:    Type:   Image     Comment:   External Document

## 2010-07-08 NOTE — Miscellaneous (Signed)
Summary: Discharge Summary for PT/[facility]   Discharge Summary for PT/[facility]   Imported By: Maryln Gottron 06/28/2010 13:21:19  _____________________________________________________________________  External Attachment:    Type:   Image     Comment:   External Document

## 2010-07-30 ENCOUNTER — Telehealth (INDEPENDENT_AMBULATORY_CARE_PROVIDER_SITE_OTHER): Payer: Self-pay | Admitting: *Deleted

## 2010-07-30 ENCOUNTER — Other Ambulatory Visit: Payer: Self-pay | Admitting: Internal Medicine

## 2010-07-30 DIAGNOSIS — I714 Abdominal aortic aneurysm, without rupture: Secondary | ICD-10-CM

## 2010-08-03 ENCOUNTER — Other Ambulatory Visit: Payer: Self-pay

## 2010-08-10 NOTE — Progress Notes (Signed)
Summary: ?AAA   Phone Note Outgoing Call   Summary of Call:  patient's son send [place] a note,  a chiropractor did  x-ray on Antonio Hester ---> possible an aortic aneurysm? plan: schedule an Ao U/S  @ [facility]--- dx ?AAA Antonio E. Paz MD  July 30, 2010 9:03 AM   Follow-up for Phone Call        Left message on son's number to call back. Army Fossa CMA  July 30, 2010 9:08 AM   Additional Follow-up for Phone Call Additional follow up Details #1::        Left message for pts son to call us back. Army Fossa CMA  August 02, 2010 3:28 PM   patient's son called back--please call him at [phone].Marland KitchenMarland KitchenJerolyn Shin  August 02, 2010 3:51 PM  New Problems: * ?AAA   Additional Follow-up for Phone Call Additional follow up Details #2::    I spoke w/ pts son he is aware. Army Fossa CMA  August 02, 2010 4:41 PM   New Problems: * ?AAA

## 2010-08-11 ENCOUNTER — Ambulatory Visit
Admission: RE | Admit: 2010-08-11 | Discharge: 2010-08-11 | Disposition: A | Discharge status: Home / Self Care | Payer: Medicare Other | Source: Ambulatory Visit | Attending: Internal Medicine | Admitting: Internal Medicine

## 2010-08-11 DIAGNOSIS — I714 Abdominal aortic aneurysm, without rupture, unspecified: Secondary | ICD-10-CM

## 2010-08-16 ENCOUNTER — Encounter: Payer: Self-pay | Admitting: *Deleted

## 2010-09-10 ENCOUNTER — Inpatient Hospital Stay (HOSPITAL_COMMUNITY)
Admission: AD | Admit: 2010-09-10 | Discharge: 2010-09-15 | DRG: 191 | Disposition: A | Discharge status: Skilled Nursing Facility | Payer: Medicare Other | Source: Ambulatory Visit | Attending: Internal Medicine | Admitting: Internal Medicine

## 2010-09-10 ENCOUNTER — Observation Stay (HOSPITAL_COMMUNITY): Payer: Medicare Other

## 2010-09-10 ENCOUNTER — Ambulatory Visit (INDEPENDENT_AMBULATORY_CARE_PROVIDER_SITE_OTHER): Payer: Medicare Other | Admitting: Internal Medicine

## 2010-09-10 ENCOUNTER — Encounter: Payer: Self-pay | Admitting: Internal Medicine

## 2010-09-10 DIAGNOSIS — N400 Enlarged prostate without lower urinary tract symptoms: Secondary | ICD-10-CM | POA: Diagnosis present

## 2010-09-10 DIAGNOSIS — J441 Chronic obstructive pulmonary disease with (acute) exacerbation: Principal | ICD-10-CM | POA: Diagnosis present

## 2010-09-10 DIAGNOSIS — H9190 Unspecified hearing loss, unspecified ear: Secondary | ICD-10-CM | POA: Diagnosis present

## 2010-09-10 DIAGNOSIS — M549 Dorsalgia, unspecified: Secondary | ICD-10-CM

## 2010-09-10 DIAGNOSIS — J449 Chronic obstructive pulmonary disease, unspecified: Secondary | ICD-10-CM

## 2010-09-10 DIAGNOSIS — I10 Essential (primary) hypertension: Secondary | ICD-10-CM | POA: Diagnosis present

## 2010-09-10 DIAGNOSIS — G40909 Epilepsy, unspecified, not intractable, without status epilepticus: Secondary | ICD-10-CM | POA: Diagnosis present

## 2010-09-10 DIAGNOSIS — X58XXXA Exposure to other specified factors, initial encounter: Secondary | ICD-10-CM | POA: Diagnosis present

## 2010-09-10 DIAGNOSIS — F17200 Nicotine dependence, unspecified, uncomplicated: Secondary | ICD-10-CM | POA: Diagnosis present

## 2010-09-10 DIAGNOSIS — M949 Disorder of cartilage, unspecified: Secondary | ICD-10-CM | POA: Diagnosis present

## 2010-09-10 DIAGNOSIS — K219 Gastro-esophageal reflux disease without esophagitis: Secondary | ICD-10-CM | POA: Diagnosis present

## 2010-09-10 DIAGNOSIS — S22009A Unspecified fracture of unspecified thoracic vertebra, initial encounter for closed fracture: Secondary | ICD-10-CM | POA: Diagnosis present

## 2010-09-10 DIAGNOSIS — Z79899 Other long term (current) drug therapy: Secondary | ICD-10-CM

## 2010-09-10 DIAGNOSIS — M899 Disorder of bone, unspecified: Secondary | ICD-10-CM | POA: Diagnosis present

## 2010-09-10 DIAGNOSIS — Z950 Presence of cardiac pacemaker: Secondary | ICD-10-CM

## 2010-09-10 DIAGNOSIS — Y929 Unspecified place or not applicable: Secondary | ICD-10-CM

## 2010-09-10 DIAGNOSIS — I4891 Unspecified atrial fibrillation: Secondary | ICD-10-CM | POA: Diagnosis present

## 2010-09-10 DIAGNOSIS — G8929 Other chronic pain: Secondary | ICD-10-CM | POA: Diagnosis present

## 2010-09-10 DIAGNOSIS — R791 Abnormal coagulation profile: Secondary | ICD-10-CM | POA: Diagnosis not present

## 2010-09-10 DIAGNOSIS — E785 Hyperlipidemia, unspecified: Secondary | ICD-10-CM | POA: Diagnosis present

## 2010-09-10 DIAGNOSIS — Z7901 Long term (current) use of anticoagulants: Secondary | ICD-10-CM

## 2010-09-10 DIAGNOSIS — Z880 Allergy status to penicillin: Secondary | ICD-10-CM

## 2010-09-10 LAB — DIFFERENTIAL
Basophils Absolute: 0 10*3/uL (ref 0.0–0.1)
Basophils Relative: 1 % (ref 0–1)
Neutro Abs: 2.2 10*3/uL (ref 1.7–7.7)
Neutrophils Relative %: 55 % (ref 43–77)

## 2010-09-10 LAB — CBC
Hemoglobin: 13.4 g/dL (ref 13.0–17.0)
RBC: 4.28 MIL/uL (ref 4.22–5.81)

## 2010-09-10 LAB — BASIC METABOLIC PANEL
BUN: 15 mg/dL (ref 6–23)
Calcium: 8.8 mg/dL (ref 8.4–10.5)
GFR calc non Af Amer: >60 mL/min (ref >60)
Potassium: 3.6 mEq/L (ref 3.5–5.1)
Sodium: 138 mEq/L (ref 135–145)

## 2010-09-10 LAB — PROTIME-INR
INR: 1.08 (ref 0.00–1.49)
Prothrombin Time: 14.2 seconds (ref 11.6–15.2)

## 2010-09-10 LAB — BRAIN NATRIURETIC PEPTIDE: Pro B Natriuretic peptide (BNP): 148 pg/mL — ABNORMAL HIGH (ref 0.0–100.0)

## 2010-09-10 NOTE — Assessment & Plan Note (Signed)
Patient with severe back pain for the last few months, see history of present illness. At this point, will take care of the respiratory problem; I asked him to call me as soon as he is out of the hospital and I will arrange for a back MRI and a ortho consult.

## 2010-09-10 NOTE — Progress Notes (Signed)
  Subjective:    Patient ID: Antonio Hester, male    DOB: 1930-08-28, 77 y.o.   MRN: [id number]  HPI  Here with his salt with 2 problems. 2 days history of increased chest congestion, cough and clear sputum production.  He has a long history of OA and back pain, about 8 months ago, the pain got much worse. Initially, he did physical therapy he is assisted living facility. 2 months ago he saw a chiropractor, x-ray showed some OA, apparently he got some manipulation and got temporarily better for a few hours. At this point, the pain is severe, he has a difficult time getting out of bed most days. He needs some relief.   Past Medical History  Diagnosis Date  . CAD (coronary artery disease)   . Atrial fibrillation     s/p ablation 07/2007  . Heart block     complete, and pacemaker dependence s/p defib change 06/2008  . Cardiomyopathy     non ischemic  . COPD (chronic obstructive pulmonary disease)   . BPH (benign prostatic hyperplasia)   . Seizure     d/o (sees Dr.Love, last Ov 2006)  . Hyperlipemia   . Hypertension   . Abscess of lung   . GERD (gastroesophageal reflux disease)     severe esophagitis per EGD 2007  . Osteopenia     per DEXA 10/2009  . Osteoarthritis    Past Surgical History  Procedure Date  . Pacemaker insertion     st jude  . Cardiac defibrillator placement 06/2008   Social History: lives at [facility], has an apartment, lives w/ wife, has a dog  children x 2 , son ([place]) and daughter Jeanice Lim) Former Smoke ETOH-0- wine sometimes Smoking Status:  never Does Patient Exercise:  yes  Review of Systems No fever No lower extremity edema Some runny nose and sinus congestion He admits to some anterior chest pain and chest tightness with cough Shortness of breath is about the same as usual. No hemoptysis. As far as the back pain, he denies any lower extremity paresthesias. He admits to poor bladder continence but no bowel incontinence No dysuria or gross  hematuria, some urinary frequency     Objective:   Physical Exam Alert oriented. Neck without lymphadenopathies, crepitus or supraclavicular mass. Lungs: Poor respiratory efforts, audible expiratory > inspiratory chest congestion which is different from his baseline. He has both wheezing and rhonchi bilaterally, question of decreased  breath sounds decreased at the L base Cardiovascular regular without rhythm, no murmur. Neurological exam he walks with great difficulty. Extremities with symmetric strength and DTRs. Lower extremities: No edema Psychiatric: No anxiety or depression         Assessment & Plan:

## 2010-09-10 NOTE — Assessment & Plan Note (Addendum)
Patient presents today with a two-day history of increased chest congestion. On exam he is definitely more congested than baseline, there is decreased breath sounds at the left base (pneumonia?) His O2 sat today is 88%, usually is around 96. I'm concerned about Antonio Hester, he has the potential to get worse quickly. I spoke with Dr. Fulton Reek, we agreed that he will be admitted to [facility] for further treatment. I don't think he needs an ICU bed. We will send him with his son. Most likely will need aggressive COPD treatment as well as a chest x-ray and blood work. Patient in  Agreement. [name] is a patient of Dr. Delford Field, consider call [facility]

## 2010-09-11 LAB — BASIC METABOLIC PANEL
Chloride: 101 mEq/L (ref 96–112)
GFR calc non Af Amer: >60 mL/min (ref >60)
Glucose, Bld: 175 mg/dL — ABNORMAL HIGH (ref 70–99)
Potassium: 4.2 mEq/L (ref 3.5–5.1)
Sodium: 137 mEq/L (ref 135–145)

## 2010-09-11 LAB — URINALYSIS, ROUTINE W REFLEX MICROSCOPIC
Hgb urine dipstick: NEGATIVE
Nitrite: NEGATIVE
Protein, ur: NEGATIVE mg/dL
Urobilinogen, UA: 1 mg/dL (ref 0.0–1.0)

## 2010-09-12 LAB — CBC
HCT: 38.8 % — ABNORMAL LOW (ref 39.0–52.0)
MCV: 90.9 fL (ref 78.0–100.0)
RBC: 4.27 MIL/uL (ref 4.22–5.81)
WBC: 7.9 10*3/uL (ref 4.0–10.5)

## 2010-09-12 LAB — COMPREHENSIVE METABOLIC PANEL
ALT: 12 U/L (ref 0–53)
AST: 14 U/L (ref 0–37)
CO2: 28 mEq/L (ref 19–32)
Chloride: 99 mEq/L (ref 96–112)
GFR calc Af Amer: >60 mL/min (ref >60)
GFR calc non Af Amer: >60 mL/min (ref >60)
Potassium: 3.9 mEq/L (ref 3.5–5.1)
Sodium: 135 mEq/L (ref 135–145)
Total Bilirubin: 0.6 mg/dL (ref 0.3–1.2)

## 2010-09-12 LAB — URINE CULTURE: Culture  Setup Time: 201204211141

## 2010-09-12 LAB — DIFFERENTIAL
Lymphocytes Relative: 6 % — ABNORMAL LOW (ref 12–46)
Lymphs Abs: 0.5 10*3/uL — ABNORMAL LOW (ref 0.7–4.0)
Neutrophils Relative %: 89 % — ABNORMAL HIGH (ref 43–77)

## 2010-09-13 LAB — CBC
MCV: 92.5 fL (ref 78.0–100.0)
Platelets: 168 10*3/uL (ref 150–400)
RBC: 4.24 MIL/uL (ref 4.22–5.81)
WBC: 8.7 10*3/uL (ref 4.0–10.5)

## 2010-09-13 LAB — DIFFERENTIAL
Eosinophils Absolute: 0 10*3/uL (ref 0.0–0.7)
Lymphs Abs: 0.6 10*3/uL — ABNORMAL LOW (ref 0.7–4.0)
Neutrophils Relative %: 89 % — ABNORMAL HIGH (ref 43–77)

## 2010-09-13 LAB — COMPREHENSIVE METABOLIC PANEL
ALT: 10 U/L (ref 0–53)
AST: 14 U/L (ref 0–37)
Albumin: 3.3 g/dL — ABNORMAL LOW (ref 3.5–5.2)
CO2: 27 mEq/L (ref 19–32)
Calcium: 8.9 mg/dL (ref 8.4–10.5)
GFR calc Af Amer: >60 mL/min (ref >60)
Sodium: 135 mEq/L (ref 135–145)
Total Protein: 5.9 g/dL — ABNORMAL LOW (ref 6.0–8.3)

## 2010-09-13 LAB — PROTIME-INR
INR: 2.8 — ABNORMAL HIGH (ref 0.00–1.49)
Prothrombin Time: 29.6 seconds — ABNORMAL HIGH (ref 11.6–15.2)

## 2010-09-13 NOTE — Discharge Summary (Signed)
NAME:  Antonio Hester, Antonio Hester NO.:  [id number]  MEDICAL RECORD NO.:  [id number]           PATIENT TYPE:  O  LOCATION:  1420                         FACILITY:  [facility]  PHYSICIAN:  Talmage Nap, MD  DATE OF BIRTH:  November 01, 1930  DATE OF ADMISSION:  09/10/2010 DATE OF DISCHARGE:                        DISCHARGE SUMMARY - REFERRING   TEMPORARY DISCHARGE SUMMARY:  POSSIBLE DATE OF DISCHARGE:  September 14, 2010.  PRIMARY CARE PHYSICIAN:  Willow Ora, MD  PRIMARY PULMONOLOGIST:  Shan Levans, MD  PRIMARY DISCHARGE DIAGNOSES: 1. Chronic obstructive pulmonary disease exacerbation. 2. Chronic back pain secondary to compression fracture of the thoracic     spine (T12).  The patient tolerated the Celebrex.  SECONDARY DIAGNOSES: 1. Atrial fibrillation, status post catheter ablation.  The patient is     on Coumadin. 2. Sick sinus syndrome, status post pacemaker. 3. Seizure disorder. 4. Hypertension. 5. Dyslipidemia. 6. Gastroesophageal reflux disease. 7. Benign prostatic hyperplasia. 8. Hearing impaired. 9. History of osteopenia.  HISTORY AND PHYSICAL:  The patient is a 77 year old, very pleasant Caucasian male with history of sick sinus syndrome, status post pacemaker, and COPD, who was admitted to the hospital on September 10, 2010, by Dr. Clydia Llano with 7-day history of shortness of breath that was said to be getting progressively worse.  This was, however, said to be associated with cough that was nonproductive of sputum.  The patient denied any associated chest pain, no palpitations.  No nausea or vomiting.  No fever, no chills or rigor.  He was also said to have complained about pain in his lower back and there was no radiation of the pain.  There was no urinary or bowel involvement.  The shortness of breath was said to have been getting progressively worse.  The patient was sent from Dr. Leta Jungling office for a direct admit to the hospital.  PREADMISSION MEDICATIONS  INCLUDE: 1. Dilantin 100 mg 2 tablets p.o. b.i.d. 2. Coumadin 5 mg p.o. daily. 3. Pulmicort 0.5 mg/2 mL suspension twice a day. 4. Atrovent 0.03% solution nebulizer twice a day. 5. Nasonex 50 mcg suspension 2 puffs each nostril daily. 6. Coreg 3.125 mg p.o. daily. 7. Lisinopril 5 mg p.o. daily. 8. Simvastatin 40 mg p.o. daily. 9. Gaviscon Extra Strength twice a daily. 10.Combivent 2 puffs q.4 p.r.n. 11.Albuterol sulfate nebulizer.  ALLERGIES:  PENICILLIN.  SOCIAL HISTORY:  The patient lives in [facility], which is an independent living, with his spouse and he is the caregiver because the spouse is said to have dementia.  There is presently no history of alcohol or tobacco use.  FAMILY HISTORY:  Negative for premature coronary artery disease.  REVIEW OF SYSTEMS:  Essentially as documented in the initial history and physical.  At the time the patient was seen by the admitting physician: Vital Signs: Temperature was 97.9, pulse 75, respiratory rate 16, blood pressure was 170/82, saturating 95% on room air.  HEENT:  Pupils were reactive to light and extraocular muscles were intact.  Neck:  No jugular venous distention.  No carotid bruit.  No lymphadenopathy. Chest:  Expiratory and inspiratory rhonchi all  over the lung fields with decreased air entry bilaterally.  Heart:  Sounds were irregular. Abdomen:  Soft, nontender.  Liver, spleen, kidney not palpable.  Bowel sounds were positive.  Extremities: No pedal edema.  Neurologic: Nonfocal.  Musculoskeletal:  Arthritic changes in the knees and feet. Neuropsychiatric:  Unremarkable.  LABORATORY DATA:  Initial complete blood count with differential showed WBC of 4.0, hemoglobin of 13.4, hematocrit of 39.2, MCV of 91.6 with a platelet count of 151.  Coagulation profile showed PT 14.2, INR 1.08. Basic metabolic panel showed sodium of 138, potassium of 3.6, chloride of 111 with a bicarb of 29, glucose is 98, BUN is  15, creatinine 0.95. BNP 148.  Thyroid panel, TSH 2.880, normal.  Urinalysis, unremarkable. Repeat basic metabolic panel done on September 11, 2010, showed sodium of 137, potassium of 4.2, chloride of 111 with a bicarb of 21, glucose is 175, BUN is 16, creatinine is 1.04, and then complete blood count with differential showed WBC of 7.9, hemoglobin of 13.4, hematocrit of 38.8, MCV of 90.9 with a platelet count of 172, neutrophils 89% and the absolute neutrophil count is 7.  Urine culture, no growth.  A repeat comprehensive metabolic panel done on September 12, 2010, showed sodium of 135, potassium of 3.9, chloride of 99 with a bicarb of 28, glucose is 196, BUN is 17, creatinine 0.91.  LFT, normal.  Magnesium level is 1.9, and complete blood count with differential showed WBC of 7.9, hemoglobin 13.4, hematocrit 38.8, MCV of 90.9 with a platelet count of 172, neutrophils 89%.  Imaging studies done on the patient include x-ray of the thoracic spine which showed new compression deformity involving T12 vertebral body with 40% loss of height.  There are mild degenerative changes in the lower cervical spine and diffuse calcification along the thoracic and abdominal aorta.  X-ray of the lumbar spine showed mild facet disease noted at the lumbar spine.  Chest x-ray showed COPD with bibasilar atelectasis and a new compression deformity at the level of T12.  HOSPITAL COURSE:  The patient was admitted to telemetry.  He was started on Xopenex and Atrovent nebulizers.  The patient was also given Lovenox and Coumadin; dosing was done by pharmacy.  Pain control was done with Tylenol and oxycodone.  The patient was empirically started on Rocephin 1 g IV q. 24, azithromycin 500 mg IV q.24, and Flexeril 5 mg p.o. t.i.d. p.r.n. for the spasm in his lower back.  Also given to the patient was then Solu-Medrol 80 mg IV q.8 hourly, Dilantin 200 mg p.o. b.i.d., lisinopril 5 mg p.o. daily, and Coreg 3.125 mg p.o.  daily.  The patient was, however, seen by me for the very first time in this index admission on September 11, 2010.  During this encounter, the patient's breathing was improved but still complained of cough that was not productive of sputum, and an excruciating lower back pain.  Examination, however, showed scattered rhonchi in the lungs.  Vital signs:  Blood pressure 176/88, pulse 76, respiratory 18 and temperature was 98.1. Following this encounter, the patient's breathing treatment was changed to albuterol and Atrovent nebs q.4 hourly, was also given Zyrtec 10 mg p.o. daily, and Robitussin 10 mL p.o. t.i.d.  For his chronic back pain, he was started on Celebrex 200 mg p.o. daily.  Since the blood pressure was still labile, the patient's lisinopril was increased to 10 mg p.o. daily, and was given Protonix 40 mg p.o. daily for GI prophylaxis. Coumadin dosing was continued  and done by the pharmacist.  So far, the patient has made remarkable progress.  He was seen by me today. Breathing has markedly improved, and the pain in the lower back pain which has made the patient unable to stand up erect, is very much improved.  His vital signs today:  Blood pressure is 141/79, temperature is 98.1, pulse 75, respiratory rate of 16.  CURRENT MEDICATIONS INCLUDE: 1. Albuterol nebulizer q.4 hourly. 2. Zithromax 500 mg IV q.24. 3. Carvedilol 3.125 mg p.o. b.i.d. 4. Ceftriaxone 1 g  IV q.24. 5. Celebrex 200 mg p.o. daily. 6. Lovenox 40 mg subcu q.24. 7. Guaifenesin 10 mL p.o. t.i.d. 8. Lisinopril 10 mg p.o. daily. 9. Loratadine 10 mg p.o. daily. 10.Solu-Medrol 80 mg IV q.8 11.Pantoprazole 40 mg p.o. b.i.d. 12.Phenytoin (Dilantin) 200 mg p.o. b.i.d. 13.MiraLAX 17 g p.o. daily. 14.Warfarin, dosing done by pharmacy. 15.Acetaminophen 650 mg p.o. q.4 p.r.n. 16.Flexeril 5 mg p.o. t.i.d. 17.Zofran 4 mg IV q.6 p.r.n. 18.Oxycodone 5 mg p.o. at bedtime. 19.Ambien 5 mg p.o. at bedtime.  The patient will  be followed and evaluated on a daily basis and on discharge, medications to take at home will be dictated by the rounding physician.     Talmage Nap, MD     CN/MEDQ  D:  09/12/2010  T:  09/12/2010  Job:  [id number]  Electronically Signed by Talmage Nap  on 09/13/2010 07:00:04 AM

## 2010-09-14 LAB — CBC
HCT: 39.4 % (ref 39.0–52.0)
Hemoglobin: 13.5 g/dL (ref 13.0–17.0)
MCHC: 34.3 g/dL (ref 30.0–36.0)
MCV: 92.5 fL (ref 78.0–100.0)
RDW: 13.4 % (ref 11.5–15.5)

## 2010-09-14 LAB — DIFFERENTIAL
Eosinophils Relative: 0 % (ref 0–5)
Lymphocytes Relative: 11 % — ABNORMAL LOW (ref 12–46)
Lymphs Abs: 0.9 10*3/uL (ref 0.7–4.0)
Monocytes Absolute: 0.8 10*3/uL (ref 0.1–1.0)
Monocytes Relative: 9 % (ref 3–12)

## 2010-09-14 LAB — COMPREHENSIVE METABOLIC PANEL
AST: 16 U/L (ref 0–37)
Alkaline Phosphatase: 55 U/L (ref 39–117)
BUN: 18 mg/dL (ref 6–23)
CO2: 30 mEq/L (ref 19–32)
Chloride: 100 mEq/L (ref 96–112)
Creatinine, Ser: 0.94 mg/dL (ref 0.4–1.5)
GFR calc non Af Amer: >60 mL/min (ref >60)
Potassium: 4 mEq/L (ref 3.5–5.1)
Total Bilirubin: 0.4 mg/dL (ref 0.3–1.2)

## 2010-09-15 LAB — BASIC METABOLIC PANEL
BUN: 21 mg/dL (ref 6–23)
CO2: 32 mEq/L (ref 19–32)
Calcium: 8.5 mg/dL (ref 8.4–10.5)
Creatinine, Ser: 1.02 mg/dL (ref 0.4–1.5)
GFR calc non Af Amer: >60 mL/min (ref >60)
Glucose, Bld: 98 mg/dL (ref 70–99)

## 2010-09-15 LAB — CBC
Hemoglobin: 13.5 g/dL (ref 13.0–17.0)
MCH: 31.7 pg (ref 26.0–34.0)
MCHC: 34 g/dL (ref 30.0–36.0)
Platelets: 146 10*3/uL — ABNORMAL LOW (ref 150–400)
RDW: 13.3 % (ref 11.5–15.5)

## 2010-09-15 LAB — PROTIME-INR: INR: 3.13 — ABNORMAL HIGH (ref 0.00–1.49)

## 2010-09-15 NOTE — Discharge Summary (Signed)
  NAME:  Antonio Hester, HECKMANN NO.:  [id number]  MEDICAL RECORD NO.:  [id number]           PATIENT TYPE:  I  LOCATION:  1420                         FACILITY:  [facility]  PHYSICIAN:  Ramiro Harvest, MD    DATE OF BIRTH:  05/07/1931  DATE OF ADMISSION:  09/10/2010 DATE OF DISCHARGE:                              DISCHARGE SUMMARY   ADDENDUM: This is an addendum to prior discharge summary of job number [id number].  HOME MEDICATIONS:  The patient is on home O2 3 L nasal cannula per minute.     Ramiro Harvest, MD     DT/MEDQ  D:  09/15/2010  T:  09/15/2010  Job:  [id number]  Electronically Signed by Ramiro Harvest MD on 09/15/2010 06:08:50 PM

## 2010-09-15 NOTE — Discharge Summary (Signed)
NAME:  Antonio Hester, Antonio Hester NO.:  [id number]  MEDICAL RECORD NO.:  [id number]           PATIENT TYPE:  I  LOCATION:  1420                         [facility]ACILITY:  [facility]  PHYSICIAN:  Ramiro Harvest, MD    DATE OF BIRTH:  02/24/1931  DATE OF ADMISSION:  09/10/2010 DATE OF DISCHARGE:  09/15/2010                              DISCHARGE SUMMARY   PRIMARY CARE PHYSICIAN:  Willow Ora, M.D. of [facility].  PULMONOLOGIST:  Charlcie Cradle. Delford Field, MD, FCCP of [facility].  ONCOLOGIST:  [name], M.D. of [facility].  PRIMARY DISCHARGE DIAGNOSES: 1. Chronic obstructive pulmonary disease exacerbation, improved. 2. Chronic back pain secondary to compression fracture of the thoracic     spine at T12.  Pain controlled on Celebrex.  SECONDARY DIAGNOSES: 1. Atrial fibrillation status post catheter ablation.  The patient on     chronic anticoagulation with Coumadin. 2. Sick sinus syndrome status post pacemaker. 3. Seizure disorder. 4. Hypertension. 5. Dyslipidemia. 6. Gastroesophageal reflux disease. 7. Benign prostatic hyperplasia. 8. Hearing impairment. 9. History of osteopenia.  DISCHARGE MEDICATIONS: 1. Azithromycin 250 mg two tablets p.o. daily x4 days. 2. Combivent inhaler two puffs 3 times daily x4 days, then as needed. 3. Albuterol nebulizer twice daily as needed. 4. Atrovent nebulizer twice daily. 5. Coumadin 5 mg half a tablet on September 16, 2010, then INR is to be     checked and INR levels called into the patient's cardiologist, Dr.     Luberta Robertson and further dosages can be determined per Dr. Luberta Robertson or MD     at the skilled nursing facility. 6. Celebrex 200 mg p.o. daily. 7. Flexeril 5 mg p.o. t.i.d. p.r.n. 8. Robitussin 10 cc p.o. t.i.d. x4 days. 9. Loratadine 10 mg p.o. daily. 10.Capastat lozenges two lozenges 3 times daily x4 days. 11.Oxycodone 5 mg p.o. q.4 hours p.r.n. pain. 12.Pulmicort 0.5 mg per 2 cc one inhalation twice daily. 13.Phenytoin  100 mg two tablets p.o. b.i.d. 14.MiraLax 17 grams p.o. daily. 15.Prednisone 30 mg on September 16, 2010, then 20 mg on September 17, 2010,     then 10 mg on September 18, 2010, then stop. 16.Coreg 3.125 mg p.o. daily. 17.Gaviscon one tablet p.o. b.i.d. p.r.n. 18.Lisinopril 5 mg p.o. daily. 19.Nasonex two puffs to both nostrils twice daily as needed. 20.Simvastatin 40 mg p.o. q.h.s. 21.Vitamin D one tablet p.o. daily.  DISPOSITION AND FOLLOWUP:  The patient will be discharged to the skilled nursing facility at [facility].  The patient will need a PT/INR checked on September 16, 2010, and results will need to be called into Dr. Luberta Robertson of [facility] for further recommendations or may be determined by MD at the skilled nursing facility.  The patient will be given 2.5 mg of Coumadin starting tomorrow.  The patient is to also follow up with Dr. Shan Levans of  Pulmonary on Sep 27, 2010, at 3:30 p.m. to follow up on his acute COPD exacerbation.  The patient is also to follow up with his PCP in the next 1 to 2 weeks for hospital followup.  The patient's compression fracture will need  to be reassessed per PCP and further pain management will be deferred to the patient's PCP.  CONSULTATIONS DONE:  None.  HOSPITAL COURSE:  I personally took care of the patient on April 24 and April 25.  During that time, the patient did have an elevated INR when his INR had increased to 3.54 on September 14, 2010, and, as such, the decision was made to keep the patient and monitor his INR the next day. The patient's Coumadin was held.  His INR came down to 3.13.  The patient did not have any bleeding.  The patient will be given 2.5 mg of Coumadin today and 2.5 mg of Coumadin tomorrow and his INR will need to be checked at the skilled nursing facility and results called into either Dr. Luberta Robertson or the MD at the skilled nursing facility to determine further dosage requirements.  The patient remained in a  stable condition and improved clinically.  His antibiotic coverage was narrowed from Rocephin and azithromycin IV down to oral azithromycin.  The patient will be continued on oral azithromycin for four more days to complete an 8-day course of antibiotic therapy.  The patient will be discharged home in stable and improved condition.  For the rest of the hospitalization, please see discharge summary dictated per Dr. Beverly Gust of job number [id number].  It has been a pleasure taking care of Mr. Antonio Hester.     Ramiro Harvest, MD     DT/MEDQ  D:  09/15/2010  T:  09/15/2010  Job:  [id number]  cc:   Willow Ora, MD [phone] [place], [place] [address]  [name], M.D. [place]  [facility] Delford Field, MD, FCCP [address] [place] [address]  Electronically Signed by Ramiro Harvest MD on 09/15/2010 30:86:57 PM

## 2010-09-22 ENCOUNTER — Encounter: Payer: Self-pay | Admitting: Critical Care Medicine

## 2010-09-23 NOTE — H&P (Signed)
NAME:  TEDDY, REBSTOCK NO.:  [id number]  MEDICAL RECORD NO.:  [id number]           PATIENT TYPE:  O  LOCATION:  1420                         FACILITY:  [facility]  PHYSICIAN:  Clydia Llano, MD       DATE OF BIRTH:  Mar 09, 1931  DATE OF ADMISSION:  09/10/2010 DATE OF DISCHARGE:                             HISTORY & PHYSICAL   PRIMARY CARE PHYSICIAN:  Dr. Willow Ora.  PULMONOLOGIST:  Dr. Shan Levans.  REASON FOR ADMISSION:  Shortness of breath.  HISTORY OF PRESENT ILLNESS:  Mr. Hosang is a 77 year old Caucasian male with a past medical history of atrial fibrillation, seizure disorder, and COPD.  The patient requires oxygen occasionally at home mostly with exertion.  The patient had shortness of breath for the past 7 days, which is increasingly worsening.  The patient also has some nonproductive cough.  The patient also has been wheezing.  The patient denies any chest pain, denies palpitations, denies orthopnea.  The patient denies fever, chills, or sputum production.  Mentioned, he was been recently in [facility] with his family and he was doing fine at the beginning, but he has back pain since he came back from the [place], he started to have the cough and shortness of breath.  The patient also has on and off back pain, which is being bothering him for the past 10 days to the point he could not get off the bed by himself.  The patient sent from Dr. Leta Jungling office for direct admit concerning about his shortness of breath.  PAST MEDICAL HISTORY: 1. Atrial fibrillation, status post ablation. 2. Seizure disorder. 3. Hypertension. 4. Dyslipidemia. 5. COPD. 6. Gastroesophageal reflux disease. 7. History of sick sinus syndrome, status post pacemaker. 8. BPH. 9. History of lung abscess. 10.Osteopenia.  ALLERGIES:  PENICILLIN.  MEDICATIONS: 1. Dilantin 100 mg p.o., take 2 tablets p.o. b.i.d. 2. Coumadin 5 mg p.o. daily. 3. Pulmicort 0.25 mg per 2 mL suspension 2  times a day. 4. Atrovent 0.03% solution 2 times a day nebulizer. 5. Nasonex 50 mcg suspension 2 puffs, each nostril a day. 6. Coreg 3.125 mg p.o. daily. 7. Lisinopril 5 mg p.o. daily. 8. Simvastatin 40 mg p.o. daily. 9. Gaviscon Extra Strength twice a day. 10.Combivent 2 puffs 1 to 2 puffs every 4 hours as needed. 11.Albuterol sulfate nebulizer.  SOCIAL HISTORY:  The patient lives in [place] [facility] [employer] in independent living facility, lives with his wife.  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  The patient has history of heavy smoking.  He smoked 2 packs per day for almost 40 years.  No street drugs.  FAMILY HISTORY:  No family history of premature heart disease.  REVIEW OF SYSTEMS:  GENERAL:  Denies fever, denies chills.  CNS:  Denies blurred vision.  Denies focal weakness.  EYES:  Denies any recent visual problems.  ENT:  Some runny nose and sinus congestion.  MOUTH:  Denies difficulty in swallowing.  GI:  Denies nausea, vomiting, or diarrhea. CARDIOVASCULAR:  Denies orthopnea or palpitations.  RESPIRATORY: Shortness of breath and nonproductive cough as per  HPI.  GU: Denies any burning while passing urine.  MUSCULOSKELETAL:  He has back pain. HEMATOLOGY:  Denies easy bruising or recent blood transfusion. ALLERGIES:  Denies seasonal allergies.  PSYCHIATRY:  Denies depression or any other mood problems.  PHYSICAL EXAMINATION:  VITAL SIGNS:  Temperature is 97.9, pulse is 75, respirations 16, systolic blood pressure is 170/82, and O2 saturation is 95% on room air. GENERAL:  The patient is a well-developed Caucasian male in no acute distress. HEAD:  Normocephalic and atraumatic. EYES:  Pupils equal, reactive to light and accommodation.  Sclerae nonicteric. MOUTH:  Without oral thrush or lesions. ENT:  No abnormalities. NECK:  No JVD appreciated.  No lymphadenopathy. CARDIOVASCULAR:  No murmurs, rubs, or gallops.  Irregular rate and rhythm. RESPIRATORY:  Rhonchi,  mild wheezes bilaterally and diminished air entry. ABDOMEN:  Bowel sounds heard, soft, nontender, and nondistended. EXTREMITIES:  Lower extremity without pedal edema. NEUROLOGIC:  No focal neurological weakness. PSYCHIATRY:  No anxiety/depression.  Labs are pending.  ASSESSMENT AND PLAN: 1. Chronic obstructive pulmonary disease exacerbation.  The patient     will be admitted to the hospital telemetry bed.  He will be started     on antibiotics, bronchodilators, steroids, mucolytics, and oxygen.     Portable PA and lateral chest x-ray will all be done to exclude     pneumonia. 2. Back pain.  The patient has acute on chronic back pain.  The     patient will be on steroids anyway for his lungs and we will start     him on muscle relaxant and pain medications.  X-rays of the     thoracic and lumbar spine also will be done.  We will consider MRI     for further evaluation if the pain continues.  We will ask PT and     OT to come and evaluate him. 3. Atrial fibrillation.  The patient takes Coumadin.  We will ask     pharmacy to dose that.  The patient is rate controlled.  We will     continue his home medication. 4. Hypertension, continue home medication.     Clydia Llano, MD     ME/MEDQ  D:  09/10/2010  T:  09/10/2010  Job:  [id number]  cc:   Willow Ora, MD [phone] [place], [place] [address]  Electronically Signed by Clydia Llano  on 09/23/2010 12:38:18 PM

## 2010-09-24 ENCOUNTER — Encounter: Payer: Self-pay | Admitting: Internal Medicine

## 2010-09-24 ENCOUNTER — Ambulatory Visit (INDEPENDENT_AMBULATORY_CARE_PROVIDER_SITE_OTHER): Payer: Medicare Other | Admitting: Internal Medicine

## 2010-09-24 DIAGNOSIS — J449 Chronic obstructive pulmonary disease, unspecified: Secondary | ICD-10-CM

## 2010-09-24 DIAGNOSIS — M549 Dorsalgia, unspecified: Secondary | ICD-10-CM

## 2010-09-24 DIAGNOSIS — J4489 Other specified chronic obstructive pulmonary disease: Secondary | ICD-10-CM

## 2010-09-24 DIAGNOSIS — R627 Adult failure to thrive: Secondary | ICD-10-CM

## 2010-09-24 DIAGNOSIS — R6251 Failure to thrive (child): Secondary | ICD-10-CM

## 2010-09-24 DIAGNOSIS — I4891 Unspecified atrial fibrillation: Secondary | ICD-10-CM

## 2010-09-24 MED ORDER — CELECOXIB 100 MG PO CAPS: 100.0000 mg | ORAL_CAPSULE | Freq: Two times a day (BID) | ORAL | Status: DC | PRN

## 2010-09-24 NOTE — Progress Notes (Signed)
  Subjective:    Patient ID: Antonio Hester, male    DOB: June 22, 1930, 77 y.o.   MRN: [id number]  HPI Patient was last seen several days ago, he was sent to the hospital and admitted  from 4-22 to 4-25, then he went to rehabilitation. Chart is reviewed. He was diagnosed with a COPD exacerbation and treated with IV antibiotics initially and then with antibiotics by mouth. He also had severe back pain, x-ray of a lumbar spine show a new compression fracture at T12 with 40% loss of height., He was treated with Celebrex which he tolerated well. Last CBC was essentially normal except for a platelet count of 146. Labs BMP was normal. INR on April 25 was 3.1. TSH and urinalysis were normal.   Past Medical History  Diagnosis Date  . CAD (coronary artery disease)   . Atrial fibrillation     s/p ablation 07/2007  . Heart block     complete, and pacemaker dependence s/p defib change 06/2008  . Cardiomyopathy     non ischemic  . COPD (chronic obstructive pulmonary disease)   . BPH (benign prostatic hyperplasia)   . Seizure     d/o (sees Dr.Love, last Ov 2006)  . Hyperlipemia   . Hypertension   . Abscess of lung   . GERD (gastroesophageal reflux disease)     severe esophagitis per EGD 2007  . Osteopenia     per DEXA 10/2009  . Osteoarthritis    Past Surgical History  Procedure Date  . Pacemaker insertion     st jude  . Cardiac defibrillator placement 06/2008     Review of Systems Since he left the hospital he is a recovering slowly. Chest congestion has decreased, cough has subsided. He is more dependent on oxygen now. No fever, appetite normal. His back pain is better, he was switched from Celebrex to Mobic at the rehabilitation place. No lower extremity edema. His son points out that overall his father strength has decreased in the last few weeks. He has a difficult time ambulating although he recognizes that he is better in the last 2 or 3 days. There was a question of bladder and  bowel incontinence in the last few days but that seems to be better as well. No lower extremity numbness or paresthesias.     Objective:   Physical Exam Alert, oriented, no distress but he seems to be still very weak. Lungs: Improved compared to last office visit. He has a few expiratory wheezing and rhonchi. No crackles. He is on oxygen and on no distress at rest. Cardiovascular : regular. No murmur. Extremities no edema        Assessment & Plan:

## 2010-09-24 NOTE — Assessment & Plan Note (Addendum)
We had a conversation with the patient and his son, evidently he is not as strong as a few months ago, most likely because back pain and a COPD exacerbation.  We'll have to monitor this closely. He was somehow confused at the hospital and even at rehabilitation but that is getting better. That's probably sundowning. I tried to answer as many questions as possible today.

## 2010-09-24 NOTE — Assessment & Plan Note (Signed)
Status post exacerbation. Doing better.

## 2010-09-24 NOTE — Assessment & Plan Note (Addendum)
Back pain likely to be due to T12 vertebral fracture. I am concerned about the fact that he was switched from Celebrex to Mobic. He is on Coumadin and is at high risk for bleeding. I am recommending him to go back to Celebrex twice a day, GI precautions discussed. See instructions. Treatment options discussed: Mobic and physical therapy, MRI and possible vertebroplasty. The patient desires MRI and vertebroplasty, refer to orthopedic surgery.

## 2010-09-24 NOTE — Assessment & Plan Note (Signed)
On Coumadin, son reports that cardiology is taking care of Coumadin management.

## 2010-09-24 NOTE — Patient Instructions (Addendum)
Stop mobic Take celebrex 100mg   Twice a day as needed for pain Watch for stomach pain, nausea , vomiting, change in the color of stools

## 2010-09-27 ENCOUNTER — Encounter: Payer: Self-pay | Admitting: Critical Care Medicine

## 2010-09-27 ENCOUNTER — Ambulatory Visit (INDEPENDENT_AMBULATORY_CARE_PROVIDER_SITE_OTHER): Payer: Medicare Other | Admitting: Critical Care Medicine

## 2010-09-27 VITALS — BP 102/60 | HR 75 | Temp 97.5°F | Ht 71.0 in | Wt 166.4 lb

## 2010-09-27 DIAGNOSIS — J449 Chronic obstructive pulmonary disease, unspecified: Secondary | ICD-10-CM

## 2010-09-27 MED ORDER — ARFORMOTEROL TARTRATE 15 MCG/2ML IN NEBU: 15.0000 ug | INHALATION_SOLUTION | Freq: Two times a day (BID) | RESPIRATORY_TRACT | Status: DC

## 2010-09-27 NOTE — Patient Instructions (Signed)
Stay on oxygen at 3 liters [facility] , will order through [employer] one twice daily in nebulizer No change in atrovent or pulmicort Return 3 months

## 2010-09-27 NOTE — Progress Notes (Signed)
Subjective:    Patient ID: Antonio Hester, male    DOB: 1930-06-24, 77 y.o.   MRN: [id number]  HPI 77 y.o.WM copd 09/27/2010 Pt sent to rehab after adm 4/20- 4/25.  Since d/c dyspnea is better.  Pt notes less pain with T12 fx Notes a lot of instability in walking and gait and unable to get up and down.  Now on more oxygen. No real mucus, but does cough more.  Notes some DOE.  No qhs dyspnea.    Past Medical History  Diagnosis Date  . CAD (coronary artery disease)   . Atrial fibrillation     s/p ablation 07/2007  . Heart block     complete, and pacemaker dependence s/p defib change 06/2008  . Cardiomyopathy     non ischemic  . COPD (chronic obstructive pulmonary disease)   . BPH (benign prostatic hyperplasia)   . Seizure     d/o (sees Dr.Love, last Ov 2006)  . Hyperlipemia   . Hypertension   . Abscess of lung   . GERD (gastroesophageal reflux disease)     severe esophagitis per EGD 2007  . Osteopenia     per DEXA 10/2009  . Osteoarthritis      Family History  Problem Relation Age of Onset  . Heart disease Mother   . Heart disease Father      History   Social History  . Marital Status: Married    Spouse Name: N/A    Number of Children: 2  . Years of Education: N/A   Occupational History  . Not on file.   Social History Main Topics  . Smoking status: Former Smoker -- 2.0 packs/day for 50 years    Types: Cigarettes    Quit date: 05/23/1998  . Smokeless tobacco: Never Used  . Alcohol Use: Yes     wine sometimes  . Drug Use: Not on file  . Sexually Active: Not on file   Other Topics Concern  . Not on file   Social History Narrative   Lives at [facility], has an apartment, lives w/ wife, has a dog     Allergies  Allergen Reactions  . Penicillins     REACTION: rash     Outpatient Prescriptions Prior to Visit  Medication Sig Dispense Refill  . albuterol (PROVENTIL) (2.5 MG/3ML) 0.083% nebulizer solution Take 2.5 mg by nebulization 2 (two) times daily  as needed.       [name] albuterol-ipratropium (COMBIVENT) 18-103 MCG/ACT inhaler Inhale 2 puffs into the lungs 3 (three) times daily as needed.       . budesonide (PULMICORT) 0.25 MG/2ML nebulizer solution Take 0.25 mg by nebulization 2 (two) times daily.        . carvedilol (COREG) 3.125 MG tablet Take 3.125 mg by mouth daily.        . celecoxib (CELEBREX) 100 MG capsule Take 100 mg by mouth 2 (two) times daily as needed.        . cyclobenzaprine (FLEXERIL) 5 MG tablet Take 5 mg by mouth 3 (three) times daily as needed.        [name] lisinopril (PRINIVIL,ZESTRIL) 5 MG tablet Take 5 mg by mouth daily.        [name] loratadine (CLARITIN) 10 MG tablet Take 10 mg by mouth daily.        [name] oxycodone (OXY-IR) 5 MG capsule Take 5 mg by mouth every 4 (four) hours as needed.        [name]  phenytoin (DILANTIN) 100 MG ER capsule Take 200 mg by mouth 2 (two) times daily.        . polyethylene glycol (MIRALAX / GLYCOLAX) packet Take 17 g by mouth daily.        . simvastatin (ZOCOR) 40 MG tablet 1/2 by mouth once daily.       [name] warfarin (COUMADIN) 5 MG tablet 5 mg. Take 1/2 tablet by mouth once daily      . Alum Hydroxide-Mag Carbonate (GAVISCON EXTRA RELIEF FORMULA) 160-105 MG CHEW Chew by mouth 2 (two) times daily.        . ergocalciferol (VITAMIN D2) 50000 UNITS capsule Take 50,000 Units by mouth once a week.        [name] ipratropium (ATROVENT) 0.03 % nasal spray 2 sprays by Nasal route every 12 (twelve) hours.        . PE HCl-DM HBr-Guaifenesin Tan 20-40-200 MG/10ML SUSP Take by mouth 3 (three) times daily as needed.        . sildenafil (VIAGRA) 100 MG tablet 1/2 a day rx by urology.          Review of Systems Constitutional:   No  weight loss, night sweats,  Fevers, chills, fatigue, lassitude. HEENT:   No headaches,  Difficulty swallowing,  Tooth/dental problems,  Sore throat,                No sneezing, itching, ear ache, nasal congestion, post nasal drip,   CV:  No chest pain,  Orthopnea, PND, swelling in lower  extremities, anasarca, dizziness, palpitations  GI  No heartburn, indigestion, abdominal pain, nausea, vomiting, diarrhea, change in bowel habits, loss of appetite  Resp: Notes  shortness of breath with exertion not  at rest.  No excess mucus, no productive cough,  No non-productive cough,  No coughing up of blood.  No change in color of mucus.  No wheezing.  No chest wall deformity  Skin: no rash or lesions.  GU: no dysuria, change in color of urine, no urgency or frequency.  No flank pain.  MS:  No joint pain or swelling.  No decreased range of motion.  No back pain.  Psych:  No change in mood or affect. No depression or anxiety.  No memory loss.     Objective:   Physical Exam Filed Vitals:   09/27/10 1603  BP: 102/60  Pulse: 75  Temp: 97.5 F (36.4 C)  TempSrc: Oral  Height: 5\' 11"  (1.803 m)  Weight: 166 lb 6.4 oz (75.479 kg)  SpO2: 98%    Gen: Pleasant, well-nourished, in no distress,  normal affect  ENT: No lesions,  mouth clear,  oropharynx clear, no postnasal drip  Neck: No JVD, no TMG, no carotid bruits  Lungs: No use of accessory muscles, no dullness to percussion, distant bs  Cardiovascular: RRR, heart sounds normal, no murmur or gallops, no peripheral edema  Abdomen: soft and NT, no HSM,  BS normal  Musculoskeletal: No deformities, no cyanosis or clubbing  Neuro: alert, non focal  Skin: Warm, no lesions or rashes        Assessment & Plan:   COPD Golds stage iv copd, unable to breathhold with dpi/hfa Plan Stay on oxygen at 3 liters Start Brovana , will order through [place] one twice daily in nebulizer No change in atrovent or pulmicort Return 3 months      Updated Medication List Outpatient Encounter Prescriptions as of 09/27/2010  Medication Sig Dispense Refill  . albuterol (PROVENTIL) (2.5  MG/3ML) 0.083% nebulizer solution Take 2.5 mg by nebulization 2 (two) times daily as needed.       [name] albuterol-ipratropium (COMBIVENT) 18-103 MCG/ACT  inhaler Inhale 2 puffs into the lungs 3 (three) times daily as needed.       . Alum Hydroxide-Mag Trisilicate (GAVISCON) 80-14.2 MG CHEW Chew 1 tablet by mouth 2 (two) times daily as needed.        . budesonide (PULMICORT) 0.25 MG/2ML nebulizer solution Take 0.25 mg by nebulization 2 (two) times daily.        . carvedilol (COREG) 3.125 MG tablet Take 3.125 mg by mouth daily.        . celecoxib (CELEBREX) 100 MG capsule Take 100 mg by mouth 2 (two) times daily as needed.        . Cholecalciferol (VITAMIN D-3) 5000 UNITS TABS Take 1 tablet by mouth daily.        . cyclobenzaprine (FLEXERIL) 5 MG tablet Take 5 mg by mouth 3 (three) times daily as needed.        [name] guaiFENesin (ROBITUSSIN) 100 MG/5ML liquid 10 mL by mouth three times daily as needed for cough/congestion       . ipratropium (ATROVENT HFA) 17 MCG/ACT inhaler Inhale 1 puff into the lungs 2 (two) times daily.        [name] lisinopril (PRINIVIL,ZESTRIL) 5 MG tablet Take 5 mg by mouth daily.        [name] loratadine (CLARITIN) 10 MG tablet Take 10 mg by mouth daily.        [name] oxycodone (OXY-IR) 5 MG capsule Take 5 mg by mouth every 4 (four) hours as needed.        . phenytoin (DILANTIN) 100 MG ER capsule Take 200 mg by mouth 2 (two) times daily.        . polyethylene glycol (MIRALAX / GLYCOLAX) packet Take 17 g by mouth daily.        . simvastatin (ZOCOR) 40 MG tablet 1/2 by mouth once daily.       [name] warfarin (COUMADIN) 5 MG tablet 5 mg. Take 1/2 tablet by mouth once daily      . arformoterol (BROVANA) 15 MCG/2ML NEBU Take 2 mLs (15 mcg total) by nebulization 2 (two) times daily.  120 mL  6  . DISCONTD: Alum Hydroxide-Mag Carbonate (GAVISCON EXTRA RELIEF FORMULA) 160-105 MG CHEW Chew by mouth 2 (two) times daily.        [name] DISCONTD: ergocalciferol (VITAMIN D2) 50000 UNITS capsule Take 50,000 Units by mouth once a week.        [name] DISCONTD: ipratropium (ATROVENT) 0.03 % nasal spray 2 sprays by Nasal route every 12 (twelve) hours.        [name] DISCONTD: PE  HCl-DM HBr-Guaifenesin Tan 20-40-200 MG/10ML SUSP Take by mouth 3 (three) times daily as needed.        [name] DISCONTD: sildenafil (VIAGRA) 100 MG tablet 1/2 a day rx by urology.

## 2010-09-28 ENCOUNTER — Telehealth: Payer: Self-pay | Admitting: Critical Care Medicine

## 2010-09-28 NOTE — Telephone Encounter (Signed)
Please advise crystal fax that will be coming through on pt for Dr. Delford Field to fill out. Thanks  Carver Fila, CMA

## 2010-09-29 NOTE — Telephone Encounter (Signed)
Fax received.  Called, spoke with Misty Stanley as Alinda Money was off today.  Advised instructions stated to add brovana 78mcg/2ml to nebulizer two times daily.  Misty Stanley verbalized understanding of this and stated nothing further needed at this time.

## 2010-09-29 NOTE — Assessment & Plan Note (Signed)
Golds stage iv copd, unable to breathhold with dpi/hfa Plan Stay on oxygen at 3 liters [facility] , will order through [facility] one twice daily in nebulizer No change in atrovent or pulmicort Return 3 months

## 2010-09-30 ENCOUNTER — Other Ambulatory Visit: Payer: Self-pay | Admitting: Emergency Medicine

## 2010-09-30 ENCOUNTER — Emergency Department (HOSPITAL_BASED_OUTPATIENT_CLINIC_OR_DEPARTMENT_OTHER)
Admission: EM | Admit: 2010-09-30 | Discharge: 2010-09-30 | Disposition: A | Discharge status: Home / Self Care | Payer: Medicare Other | Attending: Emergency Medicine | Admitting: Emergency Medicine

## 2010-09-30 ENCOUNTER — Emergency Department (INDEPENDENT_AMBULATORY_CARE_PROVIDER_SITE_OTHER): Payer: Medicare Other

## 2010-09-30 DIAGNOSIS — I10 Essential (primary) hypertension: Secondary | ICD-10-CM | POA: Insufficient documentation

## 2010-09-30 DIAGNOSIS — G8929 Other chronic pain: Secondary | ICD-10-CM | POA: Insufficient documentation

## 2010-09-30 DIAGNOSIS — L98499 Non-pressure chronic ulcer of skin of other sites with unspecified severity: Secondary | ICD-10-CM | POA: Insufficient documentation

## 2010-09-30 DIAGNOSIS — Z79899 Other long term (current) drug therapy: Secondary | ICD-10-CM | POA: Insufficient documentation

## 2010-09-30 DIAGNOSIS — J449 Chronic obstructive pulmonary disease, unspecified: Secondary | ICD-10-CM | POA: Insufficient documentation

## 2010-09-30 DIAGNOSIS — R4182 Altered mental status, unspecified: Secondary | ICD-10-CM

## 2010-09-30 DIAGNOSIS — I4891 Unspecified atrial fibrillation: Secondary | ICD-10-CM | POA: Insufficient documentation

## 2010-09-30 DIAGNOSIS — Z8679 Personal history of other diseases of the circulatory system: Secondary | ICD-10-CM | POA: Insufficient documentation

## 2010-09-30 DIAGNOSIS — J4489 Other specified chronic obstructive pulmonary disease: Secondary | ICD-10-CM | POA: Insufficient documentation

## 2010-09-30 DIAGNOSIS — G319 Degenerative disease of nervous system, unspecified: Secondary | ICD-10-CM | POA: Insufficient documentation

## 2010-10-01 ENCOUNTER — Telehealth: Payer: Self-pay | Admitting: *Deleted

## 2010-10-01 LAB — COMPREHENSIVE METABOLIC PANEL
BUN: 23 mg/dL (ref 6–23)
Calcium: 9.1 mg/dL (ref 8.4–10.5)
Glucose, Bld: 113 mg/dL — ABNORMAL HIGH (ref 70–99)
Sodium: 136 mEq/L (ref 135–145)
Total Protein: 6.5 g/dL (ref 6.0–8.3)

## 2010-10-01 LAB — CBC
HCT: 39.3 % (ref 39.0–52.0)
MCHC: 34.4 g/dL (ref 30.0–36.0)
Platelets: 138 10*3/uL — ABNORMAL LOW (ref 150–400)
RDW: 13.2 % (ref 11.5–15.5)

## 2010-10-01 LAB — DIFFERENTIAL
Lymphs Abs: 1.1 10*3/uL (ref 0.7–4.0)
Monocytes Relative: 13 % — ABNORMAL HIGH (ref 3–12)
Neutro Abs: 3.2 10*3/uL (ref 1.7–7.7)
Neutrophils Relative %: 63 % (ref 43–77)

## 2010-10-01 LAB — PROTIME-INR
INR: 2.13 — ABNORMAL HIGH (ref 0.00–1.49)
Prothrombin Time: 24 seconds — ABNORMAL HIGH (ref 11.6–15.2)

## 2010-10-01 LAB — URINALYSIS, ROUTINE W REFLEX MICROSCOPIC
Glucose, UA: NEGATIVE mg/dL
Ketones, ur: NEGATIVE mg/dL
Protein, ur: NEGATIVE mg/dL
Urobilinogen, UA: 1 mg/dL (ref 0.0–1.0)

## 2010-10-01 NOTE — Telephone Encounter (Signed)
Message copied by Army Fossa on Fri Oct 01, 2010  2:06 PM ------      Message from: PAZ, [place]      Created: Fri Oct 01, 2010  1:12 PM       Went to the ER 5-10, please check on him, does he need a follow up?

## 2010-10-01 NOTE — Telephone Encounter (Signed)
Message left for patient to return my call.  

## 2010-10-04 NOTE — Telephone Encounter (Signed)
Pts son made an appt.

## 2010-10-04 NOTE — Telephone Encounter (Signed)
Message left for patient to return my call.  

## 2010-10-05 ENCOUNTER — Ambulatory Visit (INDEPENDENT_AMBULATORY_CARE_PROVIDER_SITE_OTHER): Payer: Medicare Other | Admitting: Internal Medicine

## 2010-10-05 ENCOUNTER — Encounter: Payer: Self-pay | Admitting: Internal Medicine

## 2010-10-05 DIAGNOSIS — R4182 Altered mental status, unspecified: Secondary | ICD-10-CM

## 2010-10-05 DIAGNOSIS — J4489 Other specified chronic obstructive pulmonary disease: Secondary | ICD-10-CM

## 2010-10-05 DIAGNOSIS — R627 Adult failure to thrive: Secondary | ICD-10-CM

## 2010-10-05 DIAGNOSIS — M549 Dorsalgia, unspecified: Secondary | ICD-10-CM

## 2010-10-05 DIAGNOSIS — R569 Unspecified convulsions: Secondary | ICD-10-CM

## 2010-10-05 DIAGNOSIS — I10 Essential (primary) hypertension: Secondary | ICD-10-CM

## 2010-10-05 DIAGNOSIS — J449 Chronic obstructive pulmonary disease, unspecified: Secondary | ICD-10-CM

## 2010-10-05 DIAGNOSIS — R6251 Failure to thrive (child): Secondary | ICD-10-CM

## 2010-10-05 NOTE — Assessment & Plan Note (Signed)
Overall reports some improvement. No change.

## 2010-10-05 NOTE — Assessment & Plan Note (Addendum)
Likely multifactorial but overall improving. Will discontinue OxyContin, Flexeril. Decrease dose of dilantin. reassess in 2 weeks

## 2010-10-05 NOTE — Progress Notes (Signed)
  Subjective:    Patient ID: Antonio Hester, male    DOB: 1930-09-07, 77 y.o.   MRN: [id number]  HPI Last office visit here 09-24-10. Since then, he saw pulmonary 09-29-10 the recommended to stay on oxygen at 3 liters and Start Brovana Subsequently, he went to the ER 09-30-10. Records reviewed. Had mental status changes. CBC was normal, urinalysis negative, INR 2.1, BMP and LFTs unremarkable, Dilantin 25.2 which is elevated. Chest x-ray showed mild bibasilar atelectasis, CT of the head without acute changes. He was recommended to decrease Dilantin and followup with me.  Past Medical History  Diagnosis Date  . CAD (coronary artery disease)   . Atrial fibrillation     s/p ablation 07/2007  . Heart block     complete, and pacemaker dependence s/p defib change 06/2008  . Cardiomyopathy     non ischemic  . COPD (chronic obstructive pulmonary disease)   . BPH (benign prostatic hyperplasia)   . Seizure     d/o (sees Dr.Love, last Ov 2006)  . Hyperlipemia   . Hypertension   . Abscess of lung   . GERD (gastroesophageal reflux disease)     severe esophagitis per EGD 2007  . Osteopenia     per DEXA 10/2009  . Osteoarthritis    Past Surgical History  Procedure Date  . Pacemaker insertion     st jude  . Cardiac defibrillator placement 06/2008      Review of Systems He is here today for followup with his son. Overall, the report that things are better. Cough has decreased. He is still confused on and off and having hallucinations but even those problems have decreased. Additionally, he went to see orthopedic surgery, he reported essentially  no pain, was recommended no further MRIs or procedures ;recheck in 6 weeks.     Objective:   Physical Exam Physical Exam  Alert, oriented, no distress but he seems to be still very weak.  Lungs: He has a few expiratory wheezing and rhonchi. No crackles. He is on oxygen and on no distress at rest.  Cardiovascular : regular. No murmur.  Extremities no  edema Neurological exam: He answered  appropriately all questions but no formal mental status done today       Assessment & Plan:

## 2010-10-05 NOTE — Assessment & Plan Note (Signed)
[facility]                             PULMONARY OFFICE NOTE   NAME:CLONTZUchenna, Rappaport                        MRN:          [id number]  DATE:03/12/2007                            DOB:          [birth year]    HISTORY OF PRESENT ILLNESS:  The patient is 77 year old white male  patient of Dr.  Lynelle Doctor who has a known history of emphysematous COPD  and allergic rhinitis who presents today for an acute office visit. The  patient complains over the last three days that he has had post-nasal  congestion and post-nasal drip, minimally productive cough with clear  sputum and intermittent wheezing. The patient denies any hemoptysis,  orthopnea, PND, purulent sputum, fever, chest pain, orthopnea. The  patient is maintained on triple combo nebulizer twice daily. The patient  has had a recent hospitalization for GI bleed. The patient is on chronic  Coumadin therapy. The patient reports he has had followup and his blood  work has improved at his primary care physicians. The patient denies any  further bleeding episodes.   PAST MEDICAL HISTORY:  Reviewed.   CURRENT MEDICATIONS:  Reviewed.   PHYSICAL EXAMINATION:  The patient is pleasant male in no acute  distress. He is afebrile with stable vitals signs. O2 saturation is 95%  on room air. Heart rate recheck was 98%.  HEENT: Nasal mucosa is pale. Nontender sinuses.  NECK: Supple without adenopathy. No JVD.  LUNGS: Lung sounds are diminished in the bases, otherwise, clear without  any wheezing, crackles.  CARDIAC: Regular rate and rhythm.  ABDOMEN: Soft and nontender.  EXTREMITIES: Warm without any edema.   IMPRESSION/PLAN:  Acute upper respiratory infection and rhinitis flare  with underlying chronic obstructive pulmonary disease. The patient is  recommended to use Afrin and saline nasal spray x5 days. Instruction  sheet given. Add in Mucinex DM twice daily and doxycycline x7 days. The  patient is to contact the  [facility] and inform him that he is  beginning on an antibiotic therapy. The patient should return back with  Dr.  Delford Field in one month or sooner if needed.     Rubye Oaks, NP  Electronically Signed      Charlcie Cradle Delford Field, MD, [facility]  Electronically Signed   TP/MedQ  DD: 03/12/2007  DT: 03/12/2007  Job #: [id number]

## 2010-10-05 NOTE — Assessment & Plan Note (Signed)
[facility]                         GASTROENTEROLOGY OFFICE NOTE   NAME:CLONTZHurley, Sobel                        MRN:          [id number]  DATE:03/28/2007                            DOB:          November 16, 1930    CHIEF COMPLAINT:  Epigastric pain, indigestion.   Mr. Bekele was having problems in September for about a week with  heartburn and indigestion.  As best I can tell, he stayed on his  medications at that time, using Nexium.  He has known reflux disease and  hiatal hernia.  There is no dysphagia, no persistent problems.  He is  well now.  He is having some urinary difficulties and had some urinary  incontinence with urgency to urinate but has not followed up with his  urologist as advised by Dr. Drue Novel.   His medications are listed and reviewed in the chart.   PHYSICAL EXAM:  Height 5 feet 11 inches, weight 196 pounds.  Pulse 64,  blood pressure 100/68.   ASSESSMENT:  1. Reflux disease.  It sounds like he had a brief flare of that.  I do      not think it was his gallbladder.  At any rate, he feels fine with      respect to that now.  2. He has not had a colonoscopy for colon cancer screening.  He is      somewhat frail with his lung disease and he is on Coumadin for      atrial fibrillation and his cardiologist has thought that he would      need a Lovenox window because of that.   PLAN:  1. Continue current medications.  No intervention now.  Follow up with      me as needed regarding the reflux.  2. I will put him in for a recall return visit in February.  I am      suspecting Medicare may improve a CT colonoscopy for screening at      that time and he would be a good candidate, given his      comorbidities.  I did tell him we could do it now and it would cost      him about $700, but he wants to wait until February.  I do not      think that is unreasonable.  He knows that something could develop      in the interim, i.e., colon cancer could  be diagnosed, but he has      no overt symptoms of that so we will see what is going on in      February.  I think optical colonoscopy on Coumadin is an option,      but he did not want to pursue that at this time and, again, he has      a fair number of comorbidities.  I think the important thing would      be to rule out something large.     Iva Boop, MD,FACG  Electronically Signed    CEG/MedQ  DD: 03/28/2007  DT: 03/29/2007  Job #:  [id number]   cc:   Willow Ora, MD

## 2010-10-05 NOTE — Patient Instructions (Signed)
Please discuss the medication list provided with the people at the assisted living facility.

## 2010-10-05 NOTE — Discharge Summary (Signed)
NAME:  Antonio Hester, Antonio Hester NO.:  [id number]   MEDICAL RECORD NO.:  [id number]          PATIENT TYPE:  OBV   LOCATION:  6705                         FACILITY:  [facility]   PHYSICIAN:  Raenette Rover. Felicity Coyer, MDDATE OF BIRTH:  1930/07/28   DATE OF ADMISSION:  02/01/2007  DATE OF DISCHARGE:  02/02/2007                               DISCHARGE SUMMARY   DISCHARGE DIAGNOSES:  1. Black stools, rule out melena, rule out gastrointestinal bleed,      clinically no recurrence with stable hemoglobin, stable      hemodynamics.  2. History of atrial fibrillation on chronic anticoagulation.  Okay to      resume Coumadin. Discharge INR 1.7, no bridge  3. Chronic obstructive pulmonary disease with home oxygen q.h.s. plus      p.r.n. No acute exacerbation. Outpatient follow-up with      pulmonologist as prior to admission.  4. Dyslipidemia.  Continue home statin.  5. History of seizure disorder.  No breakthrough events. Continue home      Dilantin.  6. History of chronic systolic heart failure, ejection fraction 40%.      November 2006 echo, compensated without exacerbation at this time.  7. History of permanent pacemaker.  8. History of hypertension.  9. History of gastroesophageal reflux disease with reflux. Continue      home PCI.  10.Presumed benign prostatic hypertrophy with urinary urgency and      dribbling. Trial of Flomax to begin at discharge.  Outpatient      follow-up in 2 weeks.   DISCHARGE MEDICATIONS:  1. Flomax 0.4 mg 1 p.o. q.h.s. Other medications are as prior to      admission and include:  2. Diltiazem CD 360 mg q.a.m.  3. Zocor 20 mg 1/2 tablet p.o. q.h.s.  4. Triple nebs including budesonide 0.25 mg, Atrovent 2.5 mg and      Xopenex 0.63 mg, all nebulized t.i.d.  5. Dilantin 200 mg 2 tablets p.o. b.i.d.  6. Nexium 40 mg b.i.d.  7. Oxygen 2 liters q.h.s. plus p.r.n.  8. Coumadin 5 mg tablet one on Tuesdays and Thursdays, 1/2 tablet all      other days.  Again  okay to resume with discharge INR 1.7.  No      bridge at this time.   HOSPITAL FOLLOW-UP:  Scheduled to be with primary care physician to call  in the next 2 weeks for appointment.  Also with other specialist  providers including Dr. Leone Payor GI, Dr. Delford Field pulmonary, Dr. Vonita Moss  urology as previously scheduled or as needed.   CONDITION ON DISCHARGE:  Medically stable, hemodynamically stable, no  further recurrence of black stools or other acute exacerbation of  medical problems.   HOSPITAL COURSE BY PROBLEM:  1. Black stool rule out melena.  The patient is a 77 year old      gentleman with multiple chronic medical issues but in generally      good health who recently had been taking Pepto-Bismol in addition      to Tums for an increase in his reflux symptoms despite b.i.d.  Nexium for which he sees Dr. Leone Payor. On the day of presentation to      the emergency room, he had had a very large bowel movement black in      nature, different from his usual stool quality. Due to concerns      over this change, he called his primary care physician who referred      him to evaluation at the emergency room due to the patient's      concurrent use of Coumadin and concern for melena.  He was      evaluated in the emergency room where he was admitted for presumed      melena to rule out further GI bleed.  He was gently hydrated and      left n.p.o. overnight.  He had no further bowel movements black or      otherwise and his hemoglobin dropped from 14.5-13.0.  He was      hemodynamically stable, had no further abdominal pain or even      increase in his reflux symptoms. Due to this apparent stability, he      was resumed on a regular diet.  Follow-up H&H was checked the next      day which remained stable at 13.2 previously at 13.0. Again no      further cramping abdominal pain or black stool.  He does feel stool      urgency at this time and will allow activity as tolerated and plan       for discharge home this afternoon presuming that he has a bowel      movement which is normal in nature without blood, blackness, or      other change in nature. We have held his Coumadin but due to the      apparent lack of ongoing GI bleed, I feel it is safe to resume this      and as his indication for anticoagulation is only that of A fib      will not plan to bridge at this time, especially with recent      question of possible subacute GI bleed. No evidence for upper GI      bleed or ulcer despite reported remote history of same. Will      continue his home PPI twice daily with outpatient follow-up on his      GERD symptoms as prior to admission.  2. BPH.  On the day of discharge, the patient had multiple questions      regarding his urinary habits saying in the last 3 months he has had      increased frequency and urgency with often dribbling and      incontinence. He has follow with Dr. Vonita Moss in the last several      months of this year who reportedly said that he had problems with      his prostate but no known history of prostate cancer. He has not      previously been on medication but will have a trial of Flomax at      this time as he does not believe he has been tried on this before.      I told the patient there are multiple other reasons that may be      contributing to his urinary symptoms and that should they not      improve after a trial of Flomax, he should see his primary care  physician and/or with referral back to his urologist for further      evaluation. Will also check a UA prior to discharge to rule out      infection, although clinically he is nontoxic and afebrile.  3. Other medical issues.  The patient's other chronic medical issues      are as listed in above discharge summary, no other changes were      made in his regimen as noted above.      Valerie A. Felicity Coyer, MD  Electronically Signed     VAL/MEDQ  D:  02/02/2007  T:  02/02/2007  Job:   [id number]

## 2010-10-05 NOTE — H&P (Signed)
NAME:  CANON, GOLA NO.:  [id number]   MEDICAL RECORD NO.:  [id number]          PATIENT TYPE:  EMS   LOCATION:  ED                           FACILITY:  [facility]   PHYSICIAN:  Hollice Espy, M.D.DATE OF BIRTH:  30-Dec-1930   DATE OF ADMISSION:  01/31/2007  DATE OF DISCHARGE:                              HISTORY & PHYSICAL   PRIMARY CARE PHYSICIAN:  Willow Ora, MD.   CHIEF COMPLAINT:  Abdominal pain and melena.   HISTORY OF PRESENT ILLNESS:  The patient is a 77 year old white male  with a past medical history of hypertension, atrial fibrillation and  seizure disorder; who has been in relatively good health with no  previous medical issues.  He tells me in his distant past he had at one  point diagnosed with a bleeding ulcer, but he cannot recall when this  was exactly.  In the meantime, today he has been doing well; but, he  noted some abdominal fullness yesterday, with some mild  GERD symptoms.  But, then today, he started having abdominal pain,  mostly in the upper quadrants (right greater than left) which continued  to persist.  He also felt nauseated and then had a very large, black,  tarry bowel movement around noon.  He called his PCP and was advised him  to come into the emergency room.   In the emergency room he was noted to have initial heart rate of 104,  but a blood pressure 164/103.  Over time his heart rate has stayed about  the same.  His blood pressure has come down to 128/74.   Other labs were checked on the patient.  He was found to have a  hemoglobin of 14.5, with a 62% shift.  Normal BUN with a slightly  elevated creatinine of 1.13.  His transaminase levels were normal.  He  is on Coumadin for previous atrial fibrillation, and his INR is found to  be at 1.8.   REVIEW OF SYSTEMS:  Currently he is doing well.  He was given in the  emergency room a dose of Protonix and Zofran.  He denies any headaches,  vision changes, dysphagia, chest pain,  palpitations, shortness of  breath, wheeze, cough, abdominal pain; hematuria, dysuria, constipation  or diarrhea.  No focal extremity numbness or weakness or pain.  Review  of systems is otherwise negative.   PAST MEDICAL HISTORY:  1. Atrial fibrillation.  2. Seizure disorder.  3. Hypertension.  4. Hyperlipidemia.   MEDICATIONS:  1. Cardizem 360 a day.  2. Simvastatin 20 q.h.s.  3. Budesonide 0.25 inhaled 3 times a day.  4. Albuterol 2.5 t.i.d.  5. Xopenex nebulizers 3 times a day.  6. Dilantin 200 mg b.i.d.  7. Nexium 40 b.i.d.  8. K-Dur 20 mEq b.i.d.  9. Coumadin.  He is on oxygen q.h.s.   ALLERGIES:  PENICILLIN.   SOCIAL HISTORY:  He denies any tobacco, alcohol or drug use.   FAMILY HISTORY:  Noncontributory.   PHYSICAL EXAMINATION:  VITALS:  (On admission)  Temperature 98, heart  rate 104, blood pressure 164/103  and then to 128/74, respirations 20, O2  saturations 99% on room air.  GENERAL:  He is alert and oriented x3.  No apparent distress.  HEENT:  Normocephalic and atraumatic.  Mucous membranes are slightly  dry.  NECK:  He has no carotid bruits.  HEART:  Irregular rhythm, rate controlled.  A 2/6 systolic ejection  murmur.  LUNGS:  Clear to auscultation bilaterally.  ABDOMEN:  Soft, distended, nontender.  Occasional bowel sounds.  EXTREMITIES:  Show no clubbing, cyanosis or edema.   LAB WORK:  White count 5.4, hemoglobin 14.5, hematocrit 42, MCV 95,  platelet count 178 with no shift.  Sodium 137, potassium 4.3, chloride  104, bicarb 26, BUN 15, creatinine 1.1, glucose 93.  LFTs are  unremarkable.  INR was 1.8.  PTT 36.   ASSESSMENT AND PLAN:  1. SUSPECTED UPPER GASTROINTESTINAL BLEED.  Likely this is caught      early.  Will make the patient n.p.o., IV Protonix, gentle hydration      and follow-up H&H and BMET in the morning.  Will ask GI to see.  2. SEIZURE DISORDER.  NPO on his medications.  Change his dilantin to      IV.  3. HYPERTENSION.  Stable,  holding his antihypertensives in the wake of      a bleed.  4. ATRIAL FIBRILLATION.  Holding his Coumadin until after he has had      his scope.      Hollice Espy, M.D.  Electronically Signed     SKK/MEDQ  D:  01/31/2007  T:  01/31/2007  Job:  [id number]   cc:   Willow Ora, MD  [phone] [place], [place] [address]   [facility]

## 2010-10-05 NOTE — Assessment & Plan Note (Addendum)
He has a remote history of a seizure disorder. Back on 10-2009 and he self discontinued Dilantin (No apparent reason). He was Rx Dilantin again at the hospital. His dilantin level was recently slt elevated . Will decrease Dilantin dose to 1 tablet daily. Recheck in 2 weeks.

## 2010-10-06 NOTE — Assessment & Plan Note (Signed)
Due to a Certebral FX, saw ortho: recommend conservative treatment, see above. Will discontinue oxycodone , flexeril, pt has reported that fe feels most benefit is from celebrex which he seems to tolerate.

## 2010-10-06 NOTE — Assessment & Plan Note (Signed)
BP ok today

## 2010-10-06 NOTE — Assessment & Plan Note (Signed)
Recent INR therapeutic

## 2010-10-06 NOTE — Assessment & Plan Note (Signed)
Pt and son report that breathing is improving.

## 2010-10-08 NOTE — Discharge Summary (Signed)
NAME:  Antonio Hester, Antonio Hester                           ACCOUNT NO.:  [id number]   MEDICAL RECORD NO.:  [id number]                   PATIENT TYPE:  INP   LOCATION:  3704                                 FACILITY:  [facility]   PHYSICIAN:  Shan Levans, M.D. [facility]            DATE OF BIRTH:  April 08, 1931   DATE OF ADMISSION:  02/14/2002  DATE OF DISCHARGE:  02/21/2002                                 DISCHARGE SUMMARY   DISCHARGE DIAGNOSES:  1. Left upper lobe mass versus abscess.  2. Hyperglycemia.  3. Chronic obstructive pulmonary disease with asthmatic bronchitis with     acute exacerbation.  4. Hypercoagulable state.   HISTORY OF PRESENT ILLNESS:  Antonio Hester is a 77 year old white male with  history of significant chronic obstructive pulmonary disease, also atrial  flutter and ventricular tachycardia that is nonsustained.  He has had  radiofrequency ablation procedure in the past with atrial flutter by Dr.  Sherryl Manges which has partially controlled his heart rate.  He also has  underlying coronary artery disease and significant COPD and associated  gastroesophageal reflux disease.  As an outpatient he was nebulized on  Xopenex and Atrovent.  He has had significant side effects with albuterol in  the past, not able to tolerate the beta agonists effects of it secondary to  tachyarrhythmias. He is poorly tolerant of nebulized therapies and has not  responded to systemic prednisone and/or oral antibiotics despite multiple  visits over three months.  Due to that fact, and the fact that he continued  to have purulent yellow mucus and orthopnea and paroxysmal nocturnal  dyspnea, he was admitted for further evaluation and treatment.   LABORATORY DATA:  Operative report on February 21, 2002 demonstrates  fiberoptic bronchoscopy performed by Dr. Shan Levans.  The reasons for  the procedure were left upper lobe cavitary mass.  Impression was left upper  lobe cavitary mass, rule out malignancy without  evidence of endobronchial  lesion.  Also rule out lung abscess.  Another report of surgical pathology  shows benign bronchial mucosa and cartilage.  No atypia or evidence of  malignancy identified.  Bronchial washings were also negative for malignant  cells.  A 12-leak EKG shows __________  junctional rhythm and nonspecific ST-  T wave changes.  Ventricular rate is 68 beats per minute.  CT scan of the  chest on February 15, 2002 demonstrates questionable cavitary mass, left  upper lobe, measuring 4.5 cm in diameter. While this could be a quiet  pneumonia, it is more masslike in appearance.  The mass is a consolidation  consistent with pneumonia.  No evidence of lymphedema or lymphadenopathy in  the chest or upper abdomen.  There is cardiomegaly and moderate coronary  artery calcification.  Hiatal hernia, severe emphysema.  Chest x-ray shows  no pneumothorax after fiberoptic bronchoscopy.  Left hilar mass left upper  lobe, atelectasis remains.  INR peaked at 6.8  with low of 1.9.  WBC 8.4,  hemoglobin 11.3, hematocrit 33.7, platelets 273.  Sodium 132, potassium 4.1,  chloride 27, CO2 28, glucose peaked at 228 with a low of 107.  BUN 7,  creatinine 0.8, calcium 8.4, total protein 5.9, albumin 2.3, AST 15, ALT 36,  ALP 56, total protein 0.5.  Sputum evaluation was not acceptable.  Secondary  respiratory culture demonstrates no growth.  AFB is negative but still  pending.   HOSPITAL COURSE:  1. Left upper lobe mass versus abscess.  Antonio Hester was admitted with chest     x-ray and CT scan revealing left upper lobe mass, questionable pneumonia.     He was treated with IV antibiotics and IV steroids along with nebulized     bronchodilators consisting of Xopenex due to his intolerance of     albuterol.  He underwent fiberoptic bronchoscopy which did not reveal an     endobronchial lesion.  Transbronchial biopsies x 5 and bronchioalveolar     lavage revealed no malignant cells.  He was discharged  with a change in     his antibiotics to Cleocin p.o.  2. Hyperglycemia.  Hyperglycemia was secondary to his steroid use.  Once the     steroids were decreased, his glucose returned from a peak greater than     200 to a 107.  3. Chronic obstructive pulmonary disease, asthmatic bronchitis.  He was     continued on his nebulized treatments.  He also had steroids utilized.     He has severe emphysema as documented by CT scan.  This  is a     hypocoagulable state as noted in the lab section.  He had elevated INR.     This was treated by holding his Coumadin.  INR had returned to normal     state by day of discharge.  He remains on Coumadin for his history of     atrial tachy arrhythmias.   DISCHARGE MEDICATIONS:  1. Aciphex 20 mg once a day.  2. Atenolol 50 mg once a day.  3. Coumadin.  He is to be started on his regular dose in the a.m. of 10.  He     is to have his pro time checked in one week.  4. Zocor 40 mg a day.  5. Tiazac 120 mg two times a day.  6. _____________  300 mg at bedtime.  7. Xopenex and Atrovent 0.63 and 0.5 respectively three or four times a day.  8. Klor-Con one a day.  9. Pulmicort 0.25 mg nebulizers three times a day.  10.      Ceftin one tablet two times a day.  11.      Cleocin 300 mg t.i.d until complete.  12.      Prednisone taper 15 mg x 4 days, then 10 mg x 4 days, 5 mg x 4 days     and then stop.   DIET:  Low salt, no concentrated sweets.   FOLLOW UP:  Dr. Delford Field in one to two weeks.  He is to call [phone] for an  appointment.   CONDITION ON DISCHARGE:  Improved.     Antonio Hester, A.C.N.P. [facility]                 Shan Levans, M.D. [facility]    SM/MEDQ  D:  04/01/2002  T:  04/01/2002  Job:  [id number]   cc:   Duke Salvia, M.D. [facility]

## 2010-10-08 NOTE — H&P (Signed)
[place]. [facility]  Patient:    KADIN, CANIPE Visit Number: [id number] MRN: [id number]          Service Type: MED Location: [phone] Attending Physician:  Rollene Rotunda Dictated by:   Rollene Rotunda, M.D. [facility] Admit Date:  08/18/2001   CC:         [name], M.D., [place], [place]  Nathen May, M.D.   History and Physical  PRIMARY CARDIOLOGIST:  Dr. [name], [place], [place].  REASON FOR PRESENTATION:  Evaluate patient with palpitations and chest pain.  HISTORY OF PRESENT ILLNESS:  The patient is a very pleasant 77 year old gentleman with a history of atrial tachycardia and atrial flutter.  He has had radio frequency ablation of this with Dr. Graciela Husbands in July of 2002.  He had been having rare "skips"; however, more recently he has been having more of these with some nonsustained episodes lasting just a few seconds.  He has had one sustained episode lasting several minutes.  He reports he wore a monitor for one week but had no dysrhythmias while wearing this.  He was to follow with Dr. Luberta Robertson soon and was likely going to have a Holter monitor placed and then be referred for followup with Dr. Graciela Husbands.  Yesterday the patient had a stomachache all day and was mildly dizzy.  At 4:30 a.m. he woke with palpitations and feeling like his pacemaker was giving him a "electric shock."  He was quite dizzy and described 7/10 chest pressure on the left side.  He was mildly short of breath.  He was not diaphoretic or nauseated.  Symptoms lasted for 1-1/2 hours and resolved by the time he got to the emergency room.  His chest discomfort resolved with the palpitations.  He was noted to have an atrial paced rhythm with one run of a regular tachycardia with paced ventricular beats.  He is admitted now to rule out myocardial infarction and monitor his rhythm.  PAST MEDICAL HISTORY: 1. Nonobstructed two-vessel coronary artery disease.   Catheterization in [facility] (perhaps January 2002, although the patient is not clear of the    dates). 2. Supraventricular arrhythmias as described above. 3. Sick sinus syndrome, status post Pacesetter pacemaker placement by    Dr. Luberta Robertson in February 2002. 4. Complex partial seizures. 5. TIA/CVA. 6. Chronic anticoagulation. 7. Hyperlipidemia. 8. Decreased hearing.  PAST SURGICAL HISTORY:  Right eye surgery.  ALLERGIES:  PENICILLIN.  MEDICATIONS: 1. Advair. 2. Tiazac 120 mg q.d. 3. Zocor 40 mg q.d. 4. Toprol XL 50 mg b.i.d. 5. Aciphex 20 mg q.d. 6. Lorazepam. 7. Coumadin. 8. Dilantin 300 mg q.h.s.  SOCIAL HISTORY:  The patient lives with his wife.  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  He quit smoking in November 2001 after 50 pack-years.  FAMILY HISTORY:  Contributory for his father with coronary artery disease.  REVIEW OF SYSTEMS:  Positive for recent upper respiratory infection, bilateral thigh discomfort with walking, increasing dyspnea with exertion, recent cough productive of green sputum, slowly resolving.  Otherwise as stated in the HPI, negative for other systems.  PHYSICAL EXAMINATION:  GENERAL:  The patient is in no distress.  VITAL SIGNS:  Blood pressure 111/71, heart rate 78 and regular, respirations 18 and regular, afebrile.  HEENT:  Unremarkable.  Pupils equal, round, and react to light.  Fundi not visualized.  Oral mucosa unremarkable.  NECK:  No jugular venous distention, wave form within normal  limits.  Carotid upstroke brisk and symmetrical, no bruits, thyromegaly.  LYMPHATICS:  No cervical, axillary, or inguinal adenopathy.  LUNGS:  Clear to auscultation bilaterally.  BACK:  No costovertebral angle tenderness.  CHEST:  Well-healed pacemaker pocket.  HEART:  PMI not displaced or sustained.  S1 and S2 within normal limits, no S3, no S4, no murmurs.  ABDOMEN:  Flat, positive bowel sounds.  Normal in frequency and  pitch.  No bruits, no rebound, guarding, midline pulsatile mass, no hepatomegaly, splenomegaly.  SKIN:  No rash, nodules.  EXTREMITIES:  Had 2+ pulses throughout, no edema.  NEUROLOGIC:  Oriented to person, place, and time.  Cranial nerves II-XII grossly intact.  Motor:  Grossly intact.  LABORATORY:  EKG:  Sinus rhythm, axis within normal limits, intervals within normal limits, no acute ST/T-wave changes.  Hemoglobin 14.3, WBC 5.5, platelets 169.  Sodium 136, potassium 3.8, BUN 14, creatinine 0.9.  CK 74, MB 2.1, troponin 0.01.  Dilantin 10.0.  Chest x-ray:  No acute disease.  ASSESSMENT/PLAN: 1. Supraventricular tachycardia.  The patient has had a recurrence of this,    although not sure exactly what type, as it was not captured.  This was    quite symptomatic, and I think will need further intervention.  At this    point we will monitor him overnight.  I will try to increase his beta    blocker if his blood pressure allows.  He was to follow with Dr. Luberta Robertson    in the next couple of days and he can go ahead with this plan.  The plan    was for him to go home with a monitor.  He can then follow up with    Dr. Graciela Husbands to consider further EP evaluation.  He will continue with his    anticoagulation for his arrhythmia as well as his TIA/CVAs. 2. Chest discomfort.  The patient did have some chest discomfort.  I am not    sure what his anatomy was, although he reports nonobstructive disease.  He    will be ruled out for myocardial infarction.  If he has positive enzymes,    then we will suggest cardiac catheterization.  If he rules out he can    follow with Dr. Luberta Robertson, who knows his coronary anatomy and also knows when    he was most recently studied with a Cardiolite.  Dr. Luberta Robertson can make a    determination whether this should be repeated. Dictated by:   Rollene Rotunda, M.D. [facility] Attending Physician:  Rollene Rotunda  DD:  08/18/01 TD:  08/18/01 Job: [id number]

## 2010-10-08 NOTE — Assessment & Plan Note (Signed)
[facility]                           GASTROENTEROLOGY OFFICE NOTE   NAME:Antonio Hester, Antonio Hester                        MRN:          [id number]  DATE:04/17/2006                            DOB:          04-Nov-1930    CHIEF COMPLAINT:  Lower abdominal pain as well as terrible indigestion.   ASSESSMENT:  A 77 year old white man with chronic reflux symptoms.  He is on  Prilosec twice daily but still has a lot of indigestion at night not related  to eating necessarily or foods, though it is often related to eating.  It is  not exertional.  He also describes a periumbilical, mainly infraumbilical  crampy pain as if he ate green apples with a sour stomach, etc.  He has had  about 6 episodes of that over the last couple of months.  I detect no  obvious history of appetite change or weight loss.  There are no bowel habit  changes.   The etiology of this is unclear.  It sounds like he has gastroesophageal  reflux disease.  He has a moderate hiatal hernia seen on previous chest CT.  These symptoms could be interrelated in that he has periumbilical pain from  reflux problems, although he could have some sort of other process like an  ischemic bowel, i.e., mesenteric angina, intermittent obstructive phenomena,  and spasm.  He does not have bowel habit changes to suggest a primary  colonic process.   RECOMMENDATIONS:  1. Start with an upper GI endoscopy.  2. Pending that ultrasound versus CT scanning, favor CT scanning given his      age and the yield.  This would be abdomen and pelvis with IV contrast.      His creatinine is normal.  3. Last year I had recommended a screening colonoscopy.  He is having a      lot of problems with his breathing off and on with allergies.  He      thinks the leaves have troubled him.  Given his comorbidities and the      need for Coumadin (Dr. Luberta Robertson, his cardiologist, has suggested a      Lovenox window or making sure the INR is  greater than 1.6).  Would hold      off on that.   HISTORY:  A 77 year old white man with problems as above.  I saw him last  year, but he did not come back for followup.  He thinks he left the country  at some point.  At that point, he was taking Tums and Rolaids.  He was  eventually put on Aciphex it sounds like and now on Prilosec twice daily,  and he still has a fair amount of indigestion-type symptoms with burning in  the retrosternal area and regurgitation.  He did not describe any dysphagia.  He has not vomited.  His weight has been stable, though it is 6 pounds  lower.  He has had some sinus drainage and postnasal drip and rhinorrhea  problems which he attributes to allergies.  There is no constipation,  diarrhea, rectal bleeding,  or melena.  He did have some possible melena last  year which I thought was probably related to Pepto-Bismol.   ALLERGIES:  PENICILLIN CAUSES RASH.   MEDICATIONS:  1. Diltiazem 360 mg daily.  2. Simvastatin 40 mg daily.  3. Potassium chloride daily.  4. Warfarin 5 mg daily.  5. Phenytoin 100 mg two twice a day.  6. Prilosec b.i.d.  7. Oxygen at bedtime.  8. Pulmicort Respules t.i.d.  9. Atrovent nebulizer.  10.Xopenex p.r.n.   PAST MEDICAL HISTORY:  1. Atrial fibrillation with previous catheter ablation and defibrillator      implanted.  2. Prior history of stroke.  3. Seizure disorder.  4. Prostate nodule in the past.  5. Pacemaker.  6. COPD.  7. Allergies.  8. Clinical diagnosis of gastroesophageal reflux disease.  9. Left upper lobe cavitary mass with possible abscess and resolution,      2003.   FAMILY HISTORY:  No colon cancer.  Father had heart disease.  Mother had  endometrial carcinoma.   SOCIAL HISTORY:  He is married.  He is retired from a Engineer, building services.  One son, one daughter.  No tobacco or drugs.   REVIEW OF SYSTEMS:  Allergies.  Some chronic dyspnea.  Recently hospitalized  because of an over-anticoagulation  situation.  He was hospitalized in [facility].  He says that has resolved.   PHYSICAL EXAMINATION:  GENERAL:  Elderly white man in no acute distress.  VITAL SIGNS:  Weight 191 pounds.  Pulse 84, blood pressure 118/76.  HEENT:  Eyes anicteric.  NECK:  Supple.  CHEST:  Clear.  HEART:  S1 and S2, irregular, consistent with atrial fibrillation.  ABDOMEN:  Soft, nontender, without organomegaly or mass.  EXTREMITIES:  No edema.  NEUROLOGIC:  He is alert and oriented x3.  LYMPH NODES:  No neck or supraclavicular nodes.   I have reviewed old records and Dr. Leta Jungling recent records.  Note that CT of  the chest on March 28, 2006 shows mild cardiac enlargement with a small  amount of pericardial fluid, moderate-size hiatus hernia, advanced COPD  changes, lipoma in the left teres major muscle, atherosclerotic disease in  the aorta and branch vessels in the upper abdomen.  Normal CBC on April 05, 2006.  Hemoglobin is normal, white count 5.1.  Lipase 22, amylase 90.  CMET normal, with a BUN of 15, creatinine 1.1, GFR 70.  LFTs were normal.   I will check his INR prior to his procedure.   I appreciate the opportunity to care for this patient.     Iva Boop, MD,FACG  Electronically Signed    CEG/MedQ  DD: 04/17/2006  DT: 04/17/2006  Job #: [id number]

## 2010-10-08 NOTE — Discharge Summary (Signed)
[place]. [facility]  Patient:    Antonio Hester, Antonio Hester                        MRN: [id number] Adm. Date:  [id number] Disc. Date: [id number] Attending:  Ginnie Smart Dictator:   Janyce Llanos, M.D.                           Discharge Summary  DISCHARGE DIAGNOSES: 1. Syncopal spell likely secondary to bradyarrhythmia. 2. Coronary artery disease with catheterization in [facility] April 2001 which    revealed three-vessel coronary disease. 3. Atrial fibrillation first diagnosed April 2001 by Dr. _______ in [facility]. 4. Status post cerebrovascular accident six years ago without residual    deficits. 5. Hypercholesterolemia. 6. Tobacco abuse.  DISCHARGE MEDICATIONS: 1. Aspirin 325 mg p.o. q.d. 2. Zocor 20 mg p.o. q.d.  PROCEDURES: 1. Carotid Dopplers dated June 19, 2000, which revealed no significant    internal carotid artery stenosis bilaterally and antegrade vertebral artery    flow bilaterally. 2. A 2-D echo dated June 19, 2000, which revealed a normal LV size with an    EF of 55-65%, mildly calcified aortic valve, atrial septal aneurysm, and    mild mitral annular calcification.  CONSULTATIONS:  Consultation was obtained from [facility] Card[facility], Dr. _______, as well as electrophysiology, Dr. Berton Mount.  ADMISSION HISTORY AND PHYSICAL:  The patient is a 77 year old white male with a history of atrial fibrillation first diagnosed about a year ago when he had rapid ventricular response and palpitations and followed by Dr. _______ in [facility] who presents with a one or two minute episode of loss of consciousness while eating lunch today.  He was feeling well except for some back pain for the last three days status post a lifting injury when he blacked out while eating.  He awoke after one or two minutes and never even knew that he passed out.  He was awake and oriented x 3 upon awakening, although he thought it was Sunday when it was actually  Saturday.  He denied any chest pain or palpitations, no shortness of breath, no nausea or vomiting.   He had a bowel movement immediately after this episode, but did not experience any bowel or bladder incontinence.  He did see his cardiologist last week, he said that everything was fine, and he had no medication changes.  When EMS arrived on the scene, the heart rate was found to be 45.  He received 1 mg of atropine which increased his rate to 65.  His blood pressure was stable at 118/palp. His wife gave him one sublingual nitroglycerin x 1 while he was actually passed out.  PAST MEDICAL HISTORY:  Significant for atrial fibrillation, status post CVA six years ago, hypercholesterolemia.  MEDICATIONS ON ADMISSION: 1. Sotalol 80 mg p.o. b.i.d. 2. Digoxin 0.125 mg p.o. q.d. 3. Imdur 15 mg p.o. q.d. 4. Zocor 20 mg p.o. q.d. 5. Aspirin q.d.  FAMILY HISTORY:  His father died of an MI at age 73, mother died in her 67s of a GYN cancer.  SOCIAL HISTORY:  He smokes an occasional cigarette or cigar, drinks alcohol about four drinks most days.  Never has had problems with withdrawal symptoms. He lives in [place] with his wife and works as a Research scientist (medical) for _______.  REVIEW OF SYSTEMS:  Noncontributory.  PHYSICAL EXAMINATION ON ADMISSION:  GENERAL APPEARANCE:  In general, he is an elderly white male in  VITAL SIGNS:  He is afebrile, his heart rate is 54, his blood pressure is 164/76, and his respiratory rate is 16.  HEENT:  Normocephalic, atraumatic.  Oropharynx is clear without exudate. There is no lymphadenopathy, no thyromegaly, no carotid bruits.  CARDIOVASCULAR:  Bradycardic, but regular without murmurs, rubs, or gallops. No JVD.  CHEST:  Lungs are clear to auscultation bilaterally.  GASTROINTESTINAL:  Positive bowel sounds, soft, nontender, nondistended, no HSM, no mass.  EXTREMITIES:  Without clubbing, cyanosis, or edema.  He has 2+ palpable pulses bilateral upper and lower  extremities.  NEUROLOGIC:  He is awake and oriented x 3, slightly hard of hearing, and wears a left hearing aid.  Motor exam reveals 5/5 strength bilateral upper and lower extremities, sensation is intact, and DTRs are 2+ bilaterally upper and lower extremities.  LABORATORY AND X-RAY DATA:  Cardiac enzymes were negative x 3 sets with the exception of a slightly elevated troponin I which peaked at 0.09; otherwise, his CK and his MB fraction were within normal limits.  His white count was 7.0, his hemoglobin was 14.8, and his platelets were 201.  His chemistries were all within normal limits including normal LFTs.  His EKG revealed sinus bradycardia without any ST or T wave changes, no Q waves, 51 beats per minute.  His chest x-ray revealed poor inspiration, but mild interstitial opacities and cardiomegaly.  CT of the head revealed some old lacunar infarcts in the left cerebellum.  HOSPITAL COURSE: #1 - SYNCOPAL SPELL:  The patient was admitted to telemetry to rule out MI and also to evaluate his rhythm.  His sotalol and digoxin were held secondary to his bradycardia.  On hospital day #1, he did experience a run of 9 beats of ventricular tachycardia which was asymptomatic; otherwise, his telemetry revealed sinus bradycardia which at one point was as low as 28 beats per minute on hospital day #2.  While in the hospital, the patient never experienced chest pain, shortness of breath, syncope, or presyncope.  We got in touch with his cardiologist, Dr. _______ in [facility] and found out the results of his cardiac cath as above and also his medication history which has been stable for the last several months.  [facility] was consulted regarding these arrhythmias, and they agreed with holding the sotalol and the digoxin.  Dr. Graciela Husbands with electrophysiology also saw the patient during this hospitalization who suggested that Dr. _______ could consider other outpatient management of his  atrial fibrillation including possibly amiodarone  or Rythmol.  A 2-D echo was checked and did not reveal any worsening failure. Carotid Dopplers were also checked, and this revealed no significant stenosis or plaques.  Prior to discharge his rhythm was sinus bradycardia versus sinus rhythm in the 50s to 70s.  DISCHARGE LABORATORY DATA:  Include TSH of 2.608 and completely normal chemistries including a normal magnesium of 2.2.  DISPOSITION:  To home.  CONDITION ON DISCHARGE:  Stable.  DISCHARGE FOLLOWUP:  Follow-up is with Dr. _______ on January 29, actually the same day of discharge.  Alternative antiarrhythmics expected to be started by his physician. DD:  06/27/00 TD:  06/28/00 Job: [id number]

## 2010-10-08 NOTE — Assessment & Plan Note (Signed)
[facility]                               PULMONARY OFFICE NOTE   NAME:Antonio Hester, Antonio Hester                        MRN:          [id number]  DATE:03/20/2006                            DOB:          [birth year]    The patient is a 77 year old white male patient of Dr. Lynelle Doctor with a known  history of COPD with an asthmatic bronchitic component. The patient presents  today for an acute office visit complaining of a two-week history of  productive cough with thick yellowish sputum an intermittent wheezing. He  denies any chest pain, hemoptysis, orthopnea, paroxysmal nocturnal dyspnea  or leg swelling. The patient did notice some slight pink-tinged sputum this  morning after a prolonged coughing paroxysm.   PAST MEDICAL HISTORY:  Reviewed with this patient.   CURRENT MEDICATIONS:  Reviewed with this patient.   PHYSICAL EXAMINATION:  The patient is an elderly male in no acute distress.  He is afebrile with stable vital signs. O2 saturation is 98% on room air.  HEENT: Unremarkable.  NECK: Supple without cervical adenopathy. No JVD.  LUNGS: Lung sounds reveal coarse breath sounds bilaterally with some  scattered rhonchi.  CARDIAC: Regular rate and rhythm.  ABDOMEN: Soft and benign.  EXTREMITIES: Warm without any calf tenderness,  clubbing or edema.   IMPRESSION/PLAN:  Chronic obstructive pulmonary disease exacerbation. Chest  x-ray is pending at the time of dictation. The patient will begin Avalox x7  days. Prednisone taper over the next week. Mucinex DM twice a day. May use  Endal HD #8 ounces 1/2 to 1 teaspoon every 6 hours as needed for cough. The  patient is to return here as scheduled with Dr. Delford Field or sooner if needed.     ______________________________  Rubye Oaks, NP    ______________________________  Charlcie Cradle. Delford Field, MD, FCCP   TP/MedQ  DD: 03/20/2006  DT: 03/20/2006  Job #: [id number]

## 2010-10-08 NOTE — H&P (Signed)
NAME:  GARVEY, WESTCOTT NO.:  [id number]   MEDICAL RECORD NO.:  [id number]          PATIENT TYPE:  EMS   LOCATION:  MAJO                         FACILITY:  [facility]   PHYSICIAN:  Rosalyn Gess. Norins, M.D. [facility] OF BIRTH:  1930-10-10   DATE OF ADMISSION:  04/17/2005  DATE OF DISCHARGE:                                HISTORY & PHYSICAL   CHIEF COMPLAINT:  Gasping for breath.   HISTORY OF PRESENT ILLNESS:  Mr. Janowiak is a 77 year old married white male  with a history of COPD and a history of CHF, who was in his usual state of  health until approximately 3 days prior to admission when he developed  rhinorrhea, cough that was productive of a scant amount of clear sputum.  On  the day of admission, the patient awoke this morning with increased  shortness of breath, which he described as gasping for breath.  He had  marked limitation in activity with dyspnea on exertion.  Because of these  symptoms, he presented to the emergency department for evaluation.  The  initial studies revealed a negative chest x-ray, but elevated beta  natruretic peptide.  The patient is now admitted for O2 diuresis and further  evaluation.   PAST MEDICAL HISTORY:   SURGICAL:  No surgeries noted.   MEDICAL:  1.  Usual childhood diseases.  2.  History of atrial fibrillation, status post catheter ablation.  3.  History of seizure disorder.  4.  History of a prostate nodule.  5.  History of a pacemaker.  6.  History of COPD.  7.  History of reflux disease.   PHYSICIAN ROSTER:  1.  Dr. Wanda Plump, primary care.  2.  Dr. Shan Levans, pulmonary.  3.  Dr. Duke Salvia, electrophysiology.  4.  Dr. __________ in [facility], cardiology.  5.  Dr. Maretta Bees. Vonita Moss, urology.   CURRENT MEDICATIONS:  1.  Diltiazem at 60 mg daily.  2.  Simvastatin 40 mg daily.  3.  Budesonide inhaler.  4.  Ipratropium inhaler.  5.  Albuterol inhaler.  (All three of the above together as nebulizers.)  1.   Phenytoin 200 mg b.i.d.  2.  Prilosec 20 mg b.i.d.  3.  Potassium 20 mEq b.i.d.  4.  Coumadin alternating between 5 and 7.5 days as instructed.   DRUG ALLERGIES:  PENICILLIN causes rash.   FAMILY HISTORY:  Negative for colon cancer, COPD, prostate cancer.  Father  had heart disease.  Mother had endometrial cancer.   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  He taught physics.  He worked with  Cleda Mccreedy in __________ project.  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  The patient has been married 50 years.  His wife has had  an MI and a partial pneumonectomy.  The patient has 1 son, 1 daughter,  4  grandchildren - 3 girls, 1 boy.  The patient uses no tobacco.  He uses rare  alcohol.   REVIEW OF SYSTEMS:  The patient has occasional palpitations, but no chest  pain.  GI symptoms are controlled.  No pulmonary complaints.  GU:  The  patient has been found to have a prostate nodule and is being evaluated by  Dr. Larey Dresser.   PHYSICAL EXAMINATION ON ADMISSION:  VITAL SIGNS:  Temperature was originally  100.1 rectally; the last temperature reading was 97.4 orally.  Blood  pressure 115/59, heart rate 106, respirations 26, saturations 81% on room  air.  GENERAL APPEARANCE:  A well-nourished, well-developed, Caucasian male in no  acute distress.  HEENT:  Normocephalic and atraumatic.  Hearing aids in both ears.  The  patient is edentulous with full dentures.  No oral lesions were noted.  Posterior pharynx was clear.  Conjunctivae and sclerae were clear.  NECK:  Supple without thyromegaly.  NODES:  No adenopathy was noted in the cervical or supraclavicular region.  CHEST:  No CVA tenderness.  The patient has a pacemaker in the left anterior  chest wall.   LUNGS:  The patient has decreased breath sounds in the right base.  No rales  or wheezes were noted.  CARDIOVASCULAR:  2+ radial pulses. He had a quite precordium with a regular  tachycardia.  I appreciated no murmurs.  ABDOMEN:  Bowel sounds were positive.  His abdomen was soft.  No guarding or  rebound.  No organosplenomegaly was noted.  GENITALIA/RECTAL:  Deferred to office exams.  EXTREMITIES:  Without clubbing, cyanosis, or edema.  NEUROLOGIC:  Nonfocal.   DATABASE:  Chest x-ray read out as showing COPD without infiltrate.  EKG  showing a pacer-driven rhythm that was nonspecific.  Hemoglobin was 14.1 gm,  white count of 7100 with 77% segs, 11% lymphs, 10% monos.  Platelet count  186,000.  Chemistries revealed sodium of 135, potassium 4, chloride 103, CO2  of 26, BUN 16, creatinine 1.2.  Glucose was 125.  Point of care CK-MB was  1.4.  Point of care troponin I was less than 0.05.   ASSESSMENT AND PLAN:  1.  Pulmonary.  The patient with a history of chronic obstructive pulmonary      disease who has been stable and well managed on his home regimen.  This      does not sound like an exacerbation of COPD with the absence of      wheezing.  Plan - will continue the patient's home nebulizer treatments.      Will provide the patient with oxygen at 2 liters.  2.  Cardiovascular.  The patient with a history of congestive heart failure      in the past.  He had been taken off of Lasix.  He does have a history of      arrhythmia status post atrial fibrillation ablation.  The patient has      had no chest pain or chest discomfort.  The initial cardiac enzymes were      negative; however, I suspect the patient with mild congestive heart      failure with an elevated BNP.  Plan - Lasix IV 20 mg q.6h. x3 doses.  A      2-D echocardiogram by Albert to assess left ventricular function.  Will      continue the patient's other medications. 3.  Neurologic.  The patient is stable.  Will check a  phenytoin level  and      continue his present dose of medication.   In summary, this is a pleasant gentleman who seems to have mild congestive  heart failure responding nicely to treatments.  Expect he will have a 23-  hour observation admission.           ______________________________  Rosalyn Gess Norins, M.D. [facility]     MEN/MEDQ  D:  04/17/2005  T:  04/18/2005  Job:  [id number]   cc:   Wanda Plump, MD [facility]  [phone] [place], [place] [address]   Shan Levans, M.D. [facility]  [address]  [place]  [place] [address]   Dr. Luberta Robertson(?), Cardiology  [facility]

## 2010-10-08 NOTE — H&P (Signed)
NAME:  Antonio Hester, Antonio Hester NO.:  [id number]   MEDICAL RECORD NO.:  [id number]                   PATIENT TYPE:  INP   LOCATION:  3704                                 FACILITY:  [facility]   PHYSICIAN:  Shan Levans, M.D. [facility]            DATE OF BIRTH:  [birth year]   DATE OF ADMISSION:  02/14/2002  DATE OF DISCHARGE:                                HISTORY & PHYSICAL   CHIEF COMPLAINT:  Shortness of breath.   HISTORY OF PRESENT ILLNESS:  The patient is a 77 year old, white male,  history of significant chronic obstructive lung disease, also atrial flutter  and ventricular tachycardia nonsustained.  He has had a radiofrequency  ablation procedure in the past for his atrial flutter, which has partially  controlled his heart rate.  He also has underlying coronary artery disease  and significant COPD with associated gastroesophageal reflux disease.  The  patient has been treated as an outpatient with nebulized Xopenex and  Atrovent.  He has had significant side effects with albuterol in the past  and has not been able to tolerate this because of tachyarrhythmia.  He has  also been poorly tolerant of nebulized therapies and has also not responded  to systemic Prednisone and oral antibiotics despite multiple visits over the  last three months.  Today he came in again with increasing dyspnea and chest  congestion and coughing up thick yellow mucus, worsened when he lays flat  and based on this, we elected to bring the patient in for inpatient therapy.   PAST MEDICAL HISTORY:  Medical nonobstructive two vessel coronary artery  disease, last catheterization January 2002, history of supraventricular  tachycardia, history of sick sinus syndrome status-post pacesetter/pacemaker  placement in February 2002, history of partial complex seizures, history of  TIA and strokes, history of chronic anticoagulation, history of  hyperlipidemia, history of decreased hearing.   ALLERGIES:  PENICILLIN.   OPERATIVE HISTORY:  Right eye surgery.   CURRENT MEDICATIONS:  Xopenex 0.63 mg and Atrovent 0.5 mg t.i.d., Tiazac 120  mg b.i.d., ____________ 200 mg daily, Atenolol 50 mg daily, Potassium 10 mEq  daily, Aciphex 20 mg daily, Zocor 40 mg h.s., Coumadin 5 mg daily.   SOCIAL HISTORY:  The patient lives with his wife.  He quit smoking November  of 2001, after 50 years.  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   FAMILY HISTORY:  Contributory for a father with coronary artery disease.   REVIEW OF SYSTEMS:  Otherwise noncontributory.   PHYSICAL EXAMINATION:  GENERAL:  Patient is in no distress.  VITAL SIGNS:  Temperature 97.9, blood pressure 118/60, pulse 67, saturation  96% on room air.  CHEST:  Showed diminished breath sounds with expiratory wheezes, poor air  movement.  CARDIAC:  Showed a regular rate and rhythm with S3.  Normal S1 and S2.  ABDOMEN:  Soft, nontender.  Bowel sounds active.  EXTREMITIES:  Showed no edema, clubbing, or venous disease.  NEUROLOGIC:  Intact.  HEENT:  Showed no jugular venous distention, without adenopathy.  Oropharynx  clear.  NECK:  Supple.   LABORATORY DATA:  Pending at the time of this dictation.   IMPRESSION:  Chronic obstructive lung disease with asthmatic bronchial  exacerbation in a patient with underlying chronic atrial flutter.   RECOMMENDATIONS:  Admit to a regular room with telemetry.  Administer IV  steroids, IV cefepime.  Check sputum C&S for Gram stain.  Check routine  admission labs and chest radiograph.                                                 Shan Levans, M.D. [facility]    PW/MEDQ  D:  02/14/2002  T:  02/17/2002  Job:  [id number]   cc:   Sharyon Medicus, M.D. [facility]

## 2010-10-08 NOTE — Discharge Summary (Signed)
[facility]  Patient:    BIJON, MINEER Visit Number: [id number] MRN: [id number]          Service Type: MED Location: [phone] Attending Physician:  Rollene Rotunda Dictated by:   Rozell Searing, P.A. Admit Date:  08/18/2001 Discharge Date: 08/19/2001   CC:         Dr. Luberta Robertson in [place], [place]  Nathen May, M.D., [facility]   Referring Physician Discharge [place]  PROCEDURE:  None.  REASON FOR ADMISSION:  Mr. Kindt is a delightful 77 year old male with history of supraventricular tachycardia and atrial flutter, status post RF ablation July 2002 by Dr. Nathen May.  He is followed by Dr. Luberta Robertson in [facility] who presented with recurrent tachypalpitations.  He reported being awakened by these palpitations and noted associated dyspnea, lightheadedness and chest pain with a duration of approximately one and a half hours.  He subsequently reported no further chest discomfort.  LABORATORY DATA:  Normal CBC.  INR of 4.0 on admission.  Sodium 136, potassium of 3.8, glucose 124, BUN 14, creatinine 0.7, mildly decreased albumin of 3.2. Otherwise normal liver enzymes.  Cardiac enzymes show CPK-MB and troponin I markers normal x 2.  Dilantin level 10.  Admission chest x-ray no acute distress.  HOSPITAL COURSE:  The patient was admitted for rule out MI and further diagnostic evaluation.  Serial cardiac enzymes were normal.  The patient does have history of nonobstructive coronary artery disease by previous coronary angiography.  During his brief stay, the patient developed no tachyarrhythmia as documented by telemetry.  He did, however, complain of two extremely brief spells which were asymptomatic.  Again, however, no correlation via telemetry was noted.  The patient was cleared for discharge the following morning in hemodynamically stable condition.  No medication adjustments were made.  The patient did report having a previously  scheduled follow-up appointment with his primary cardiologist, Dr. Luberta Robertson, early next week and has been instructed to keep this appointment.  Consideration had already been given to evaluation of these tachypalpitations episodes with an event monitor.  DISCHARGE MEDICATIONS: 1. Tiazac 120 mg q.d. 2. Zocor 40 mg q.d. 3. Toprol XL 50 mg b.i.d. 4. Aciphex 20 mg q.d. 5. Coumadin 5/2.5 mg alternating daily as previously directed. 6. Dilantin 300 mg q.h.s. 7. Ativan 1 mg t.i.d. p.r.n. 8. Cardizem 30 mg p.r.n. 9. Advair as previously directed.  DISCHARGE INSTRUCTIONS:  The patient is to avoid caffeinated foods and beverages.  He is instructed to keep his previously scheduled appointment with Dr. Luberta Robertson on Tuesday, August 22, 2001 in [place].  He will then follow up with Dr. Nathen May as needed.  DISCHARGE DIAGNOSES: 1. Recurrent tachypalpitations.    a. History of supraventricular tachycardia/flutter, status post       radiofrequency ablation.    b. Status post PTVDP February 2002. 2. Noncardiac chest pain.    a. Negative serial cardiac markers.    b. History of nonobstructive coronary artery disease. 3. History of stroke. 4. Chronic Coumadin. 5. Dyslipidemia. 6. Chronic obstructive pulmonary disease, history of tobacco. 7. Seizure disorder. Dictated by:   Rozell Searing, P.A. Attending Physician:  Rollene Rotunda DD:  08/19/01 TD:  08/19/01 Job: [id number]

## 2010-10-08 NOTE — Discharge Summary (Signed)
NAME:  Antonio Hester, Antonio Hester NO.:  [id number]   MEDICAL RECORD NO.:  [id number]          PATIENT TYPE:  INP   LOCATION:  3010                         FACILITY:  [facility]   PHYSICIAN:  Rene Paci, M.D. LHCDATE OF BIRTH:  Aug 19, 1930   DATE OF ADMISSION:  04/17/2005  DATE OF DISCHARGE:  04/20/2005                                 DISCHARGE SUMMARY   DISCHARGE DIAGNOSES:  1.  Congestive heart failure exacerbation.  2.  Chronic obstructive pulmonary disease exacerbation.  3.  Chronic sinusitis.   HISTORY OF PRESENT ILLNESS:  The patient is a 77 year old male with a  history of COPD and history of CHF who presented with increasing shortness  of breath and was admitted for further evaluation.   PAST MEDICAL HISTORY:  1.  History of atrial fibrillation.  2.  History of seizure disorder.  3.  History of prostate nodule.  4.  History of pacemaker.  5.  History of COPD.  6.  History of reflux disease.   HOSPITAL COURSE:  Problem #1.  CHF exacerbation.  The patient was admitted  and was given IV Lasix with good response.  In addition, an 2-D echo was  performed.  However, at the time of this dictation, it has not yet been read  by cardiology.  This will need to be followed up as an outpatient.   Problem #2.  COPD exacerbation.  The patient was noted to have wheezing  during this admission.  He was placed on nebulizers.  Pulmonary consultation  was obtained and the patient was seen by Dr. Delford Field who is the patient's  outpatient pulmonologist.  The patient was started on p.o. steroids which  will be tapered as an outpatient and he was also started on p.o. Avelox.   Problem #3.  Chronic sinusitis.  Pulmonary felt that much of the patient's  congestion is related to post nasal drip from a chronic sinusitis.  A CT  scan of the head was performed which was consistent with chronic sinus  disease.  The patient will be discharged to home with nasal saline washes.   The  patient's INR was noted to be slightly elevated during this admission  most likely secondary to the addition of Avelox. He had previously been on  alternating schedule of 5 mg and 7.5 mg of Coumadin every other day.  He  will temporarily be dropped down to 5 mg p.o. daily but will need close  outpatient followup.  The patient states that Dr. Luberta Robertson of [facility]  Cardiology has followed his Coumadin in the past and he is instructed to  follow up on Friday to have an INR redrawn at his office.  The patient  verbalizes understanding.   DISCHARGE MEDICATIONS:  1.  Potassium 20 mEq p.o. twice daily.  2.  Lasix 20 mg p.o. once daily.  3.  Avelox 400 mg p.o. daily for five additional days.  4.  Coumadin 5 mg p.o. daily to be adjusted by Dr. Luberta Robertson.  5.  Prednisone 40 mg p.o. daily, November 29 through December 2 then  decreased to 30 mg p.o. December 3 through December 6.  Then decrease to      20 mg p.o. daily December 7 through December 10.  Then decrease to 10 mg      daily from December 11 through December 14.  Then discontinue.  6.  Xopenex 1.25 mg nebulizer three to four times daily.  7.  Atrovent 0.5 mg nebulizers three to four times daily.  8.  Budesonide 0.25 mg nebulizers three to four times daily.  9.  Nasal saline wash over-the-counter two to three sprays each nostril      three times daily to complete one bottle.  10. Diltiazem 60 mg p.o. daily.  11. Simvastatin 40 mg p.o. daily.  12. Dilantin 100 mg p.o. daily.  13. Prilosec 20 mg p.o. b.i.d.   DISCHARGE LABORATORY DATA:  Hemoglobin 5.2, hematocrit 43.2, white blood  cell count 5.7, platelets 212.  BUN 18, creatinine 1.1.   FOLLOW UP:  The patient is scheduled to follow up with Dr. Shan Levans of  pulmonary on December 15 at 2 p.m.  In addition, the patient is instructed  to follow up with Dr. Willow Ora in one to two weeks and contact for an  appointment.   The patient is instructed to not to take Spiriva. He is to  use oxygen 1 L by  nasal cannula while sleeping per pulmonary and he is to follow up with Dr.  Luberta Robertson of cardiology on Friday, December 1 for an INR.      Melissa S. Peggyann Juba, NP      Rene Paci, M.D. [facility]  Electronically Signed    MSO/MEDQ  D:  04/20/2005  T:  04/21/2005  Job:  [id number]   cc:   Shan Levans, M.D. [facility]  [address]  [place]  [place] [address]   Wanda Plump, MD [facility]  [phone] [place], [place] [address]   Dr. Luberta Robertson  Cardiology in [facility]

## 2010-10-08 NOTE — Op Note (Signed)
   NAME:  Antonio Hester, Antonio Hester NO.:  [id number]   MEDICAL RECORD NO.:  [id number]                   PATIENT TYPE:  INP   LOCATION:  3704                                 FACILITY:  [facility]   PHYSICIAN:  Shan Levans, M.D. [facility]            DATE OF BIRTH:  1930/08/16   DATE OF PROCEDURE:  02/21/2002  DATE OF DISCHARGE:                                 OPERATIVE REPORT   PROCEDURE:  Bronchoscopy.   INDICATIONS:  Left upper lobe cavitary mass.   OPERATOR:  Shan Levans, M.D.   ANESTHESIA:  1% Xylocaine local.   PREOPERATIVE MEDICATION:  Demerol 40 mg IV push, Versed 4 mg IV push.   DESCRIPTION OF PROCEDURE:  The Olympus video bronchoscope was introduced  through the right naris.  The upper airways were visualized and  unremarkable.  The entire tracheobronchial tree was visualized and revealed  no endobronchial lesions.  There was thick, tenacious mucus seen throughout  the trachea and left and right airways.  Attention was then paid to the left  upper lobe apical posterior segment.  Transbronchial biopsies x5 were  obtained.  Bronchial washings were obtained.   COMPLICATIONS:  None.   IMPRESSION:  Left upper lobe cavitary mass, rule out malignancy, without  evidence of endobronchial lesion.  Also rule out lung abscess.   RECOMMENDATIONS:  Follow up microbiology and pathology.                                                Shan Levans, M.D. [facility]    PW/MEDQ  D:  02/21/2002  T:  02/22/2002  Job:  [id number]

## 2010-10-08 NOTE — Discharge Summary (Signed)
[facility]  Patient:    Antonio Hester, Antonio Hester                        MRN: [id number] Adm. Date:  [id number] Disc. Date: [id number] Attending:  Nathen May Dictator:   [name], N.P. CC:         Dr. Leonette Most ___________, [facility], [place]  [facility], [place], [place]   Discharge Summary  PRIMARY DIAGNOSIS:  Atrial flutter.  HISTORY OF PRESENT ILLNESS:  This is a 77 year old gentleman who presented to the emergency room with intermittent heart fluttering and chest tightness.  He saw Nathen May, M.D., [facility], one and a half weeks ago and was scheduled for an ablation on Wednesday, November 29, 2000. The patient was admitted to [facility]. [facility] January and February of this year with syncope.  He received a permanent pacemaker on June 26, 2000.  No history of MI.  Heart cath last fall showed mild occlusion per patient, treated medically.  [place] He got up at 5:30 a.m. to go to the bathroom and felt increased heart rate which was brief. He had multiple episodes later that morning by 8 a.m.  He had fluctuations of his heart rate and came back to [place], [place], and presented to [employer]. [facility].  The patient was admitted for atrial flutter.  HOSPITAL COURSE:  The patient was admitted for atrial flutter.  He underwent a successful radiofrequency catheter ablation of his atrial flutter and nonsustained atrial tachycardia.  The patient tolerated the procedure well, had no immediate postop complications, and was discharged to home the following day, November 30, 2000, in stable condition.  DISCHARGE MEDICATIONS: 1. Coumadin 5 mg daily except Monday, Wednesday, Friday 2.5 mg. 2. Toprol XL 50 mg two tablets twice a day. 3. Zocor 40 daily. 4. Digoxin one half of 0.25 mg tablet daily. 5. Baby aspirin for six weeks. 6. Antibiotics prophylactically prior to any  dental work or bowel    procedures for the next three months.  ACTIVITY:  He was instructed not to do any heavy lifting above five pounds or strenuous activity for four days.  DIET:  He is to be on a low fat, low cholesterol diet.  DISCHARGE INSTRUCTIONS:  He was allowed to shower.  He was to call if he developed a lump or any drainage in his groin.  FOLLOW-UP:  He was to have blood work for his Coumadin on Monday at Dr. _______ office, follow-up with Dr. _________ within two to four weeks and Dr. Graciela Husbands on January 15, 2001, at 10:15 a.m. DD:  11/30/00 TD:  11/30/00 Job: [id number]

## 2010-10-08 NOTE — Discharge Summary (Signed)
NAME:  Antonio Hester, REVARD NO.:  [id number]   MEDICAL RECORD NO.:  [id number]                   PATIENT TYPE:  INP   LOCATION:  3741                                 FACILITY:  [facility]   PHYSICIAN:  Duke Salvia, M.D. [facility]           DATE OF BIRTH:  1930-10-28   DATE OF ADMISSION:  08/21/2002  DATE OF DISCHARGE:  08/27/2002                                 DISCHARGE SUMMARY   PRIMARY DIAGNOSIS:  Atrial fibrillation.   SECONDARY DIAGNOSES:  1. Nonobstructive coronary artery disease.  2. Atrial flutter.  3. Nonsustained ventricular tachycardia.  4. Chronic obstructive pulmonary disease.  5. Gastroesophageal reflux.  6. Left upper lobe abscess 11/03.  7. Paroxysmal atrial fibrillation.  8. Bradycardia.  9. Sick sinus syndrome.  10.      Partial seizure.  11.      [employer] pacemaker.  12.      Status post atrial flutter ablation.   HISTORY OF PRESENT ILLNESS:  This is a 77 year old gentleman with a past  medical history of atrial fibrillation, atrial flutter, status post atrial  flutter ablation.  He was seen in the office by Dr. Graciela Husbands on 07/29/02 in  follow up.  He was noted to have frequent episodes of atrial fibrillation  with rapid ventricular response.  He was seen by neurology secondary to a  headache and had been off Coumadin.  The patient now presents for a TEE for  evaluation of thrombus, and then initiation of Tikosyn.  Tikosyn was started  the following day secondary to low potassium.  Potassium on 08/22/02 was 4.8,  INR 2.5.  Tikosyn was initiated.  His Maxzide was discontinued.  The patient  was for cardioversion in the a.m., but converted back to normal sinus  rhythm.  The patient remained in normal sinus rhythm until 08/24/02, at which  point he went back into atrial fibrillation with a rapid ventricular  response.  His Tikosyn dose was decreased.  Cardizem dose was increased.  Atenolol dose was decreased, and the patient was started on  flecainide.  He  converted back to normal sinus rhythm and was discharged to home on 08/27/02  in stable condition holding normal sinus.  Sodium was 136, potassium 3.7,  chloride 105, CO2 24, glucose 115, BUN 18, creatinine 1.0, and INR was 3.8  upon day of discharge.  The patient was instructed to hold his Coumadin for  tonight and tomorrow.   DISCHARGE MEDICATIONS:  1. Atrovent 0.5 mg and Xopenex 0.63 mg in 3 cc t.i.d.  2. Coumadin was to be held tonight and tomorrow.  He was to have a PT INR     done on Thursday, and Coumadin was to be resumed unless otherwise changed     at 2.5 mg nightly Monday, Wednesday, Friday, 5 mg.  3. Aciphex 20 b.i.d.  4. Phenytoin 400 daily.  5. Zocor 40 nightly.  6.  K-Dur 40 daily.  7. Flecainide 100 q.12h.  8. Cardizem CD 240 daily.  9. Atenolol 25 daily.   DIET:  Low-fat, low-salt, low-cholesterol diet.   FOLLOW UP:  The patient was scheduled to have a BMET and an INR on Thursday  to be managed by Dr. Luberta Robertson.  He was to follow with Dr. Graciela Husbands on 09/24/02 at  2:30 p.m.     Antonio Hester, C.R.N.P. [facility]                 Duke Salvia, M.D. [facility]    DS/MEDQ  D:  08/27/2002  T:  08/28/2002  Job:  [id number]

## 2010-10-08 NOTE — Discharge Summary (Signed)
NAME:  Antonio Hester, Antonio Hester                           ACCOUNT NO.:  [id number]   MEDICAL RECORD NO.:  [id number]                   PATIENT TYPE:  INP   LOCATION:  4739                                 FACILITY:  [facility]   PHYSICIAN:  C. Ulyess Mort, M.D.             DATE OF BIRTH:  [birth year]   DATE OF ADMISSION:  08/08/2002  DATE OF DISCHARGE:  08/12/2002                                 DISCHARGE SUMMARY   PRIMARY CARE PHYSICIAN:  [name], M.D.   DISCHARGE DIAGNOSES:  1. Headache, likely tension.  2. Atrial fibrillation/atrial flutter, status post ablation and pacemaker     placement.  3. History of sick sinus syndrome.  4. History of supraventricular tachycardia.  5. Coronary artery disease.  6. Hyperlipidemia.  7. Hypertension.  8. History of cerebrovascular accident/transient ischemic attack.  9. History of seizures.  10.      Gastrointestinal reflux disease.   DISCHARGE MEDICATIONS:  1. Atenolol 50 mg p.o. daily.  2. Aciphex 40 mg p.o. daily.  3. Coumadin per [name], M.D.  4. Peyton Bottoms XT 240 mg p.o. daily.  5. Zocor 40 mg p.o. daily.  6. Klor-Con M10 one p.o. daily.  7. Phenytek 400 mg p.o. daily.  8. Atrovent nebulizers t.i.d.  9. Xopenex nebulizers t.i.d.  10.      Pulmicort MDI t.i.d.  11.      Ativan 1 mg p.o. p.r.n.   DISPOSITION:  The patient is discharged home with his wife in good  condition.   FOLLOW-UP:  The patient is to follow up with [name], M.D., as  needed.  He is also to follow up with [facility].  He will have labs  checked at [facility] on August 21, 2002, and will be admitted in  April by Duke Salvia, M.D., for initiation of new antiarrhythmic  therapy.   ISSUES AT FOLLOW-UP:  1. The patient will need further monitoring of his headaches.  After his     initial presentation, he had no further episodes of headaches during the     hospitalization.  The likely etiology was thought to be tension versus  migraine headaches.  Should the patient's headaches persist, he should be     considered for prophylactic migraine therapy.  2. Arrhythmia.  The patient was discussed with Duke Salvia, M.D.  He is     to be admitted in April for initiation of a new antiarrhythmic therapy.     He was admitted in sinus rhythm __________ an atrial spike and continued     in atrial fibrillation during the hospitalization.  This will need     further monitoring in the outpatient setting.   PROCEDURES:  1. Lumbar puncture.  The patient underwent a lumbar puncture by neurology to     rule out a subarachnoid hemorrhage.  This was benign.  2. Cerebral angiogram.  On August 18, 2002,  the patient underwent a cerebral     angiogram which revealed severe disease in both vertebral arteries with     reconstitution of the posterior circulation via retrograde flow in the     posterior communicating arteries.  There was no evidence for intracranial     aneurysm, vasospasm, or other vascular anomaly.  There was mild stenotic     extracranial atherosclerotic disease at both carotid bifurcations.  The     patient tolerated the procedure without complications.   CONSULTS:  Neurology was consulted regarding the patient's headaches and  consideration of a possible LP to rule out subarachnoid hemorrhage.  Genene Churn. Love, M.D., came and evaluated the patient.  We certainly appreciate his  input in this case.   HISTORY OF PRESENT ILLNESS:  The patient is a 77 year old Caucasian male  with a complex past medical history.  He experienced an acute onset of an  occipital headache that awoke him on the night prior to admission at 1 a.m.  The patient went back to sleep, however, he awoke in the morning he was  still having some headache with associated dizziness and slight confusion,  per his wife.  The patient denied any associated upper extremity or lower  extremity weakness, visual changes, difficulty with speech, or eating.   The  patient denied a history of headaches and stated that this was the worst  headache of my life.   PHYSICAL EXAMINATION ON ADMISSION:  VITAL SIGNS:  Temperature 98.0 degrees,  pulse 68, blood pressure 154/84, respiratory rate 20, O2 saturation 99% on  room air.  GENERAL APPEARANCE:  The patient was a pleasant, Caucasian male in no acute  distress.  HEENT:  The patient had right ptosis (chronic).  Pupils were equal, round,  and reactive to light.  Extraocular muscles were intact.  No nystagmus was  appreciated.  The oropharynx was clear without erythema or exudates.  The  mucous membranes were moist.  NECK:  Supple without lymphadenopathy or JVD.  RESPIRATIONS:  Clear to auscultation.  CARDIOVASCULAR:  Regular rate and rhythm without murmurs, rubs, or gallops.  ABDOMEN:  Soft, nontender, and nondistended with normal bowel sounds.  EXTREMITIES:  Trace bilateral lower extremity edema was appreciated.  NEUROLOGIC:  The patient was alert and oriented x 3.  Cranial nerves II-XII  were intact bilaterally.  The patient demonstrated 5/5 strength in bilateral  upper and lower extremities.  No sensory changes were appreciated.  Cerebellar function was intact via finger-to-nose testing.  The patient had  downgoing toes bilaterally.  There was no evidence of clonus.   ADMISSION LABORATORY DATA:  White blood cell count 9.2, hemoglobin 14.3,  platelets 188.  Sodium 137, potassium 4.1, chloride 104, bicarbonate 34, BUN  13, creatinine 1.2, glucose 117.  PT 22.2, INR 2.2, PTT 43.  Dilantin 13.4.  An EKG revealed a normal sinus rhythm at 69 with a question of atrial pacer  spikes.  No evidence of ischemia.  The initial CT of the head was negative.  The chest x-ray revealed a known left hilar mass.   HOSPITAL COURSE:  #1 - HEADACHE:  Given the patient's fairly benign past  history and statement that this was the worse headache of his life, there was considerable concern for a subarachnoid  hemorrhage.  Neurology was  consulted regarding this.  They felt that an LP was in fact warranted.  An  LP was performed by neurology and was without evidence of subarachnoid  hemorrhage.  Given  the persistent concerns with a negative CT and a negative  LP, the patient underwent a cerebral angiogram.  The results of this are  noted above.  The patient did not experience any further headaches during  the hospitalization.  It was felt that these headaches were likely tension  in nature versus migraine.  Should the patient continue to have headaches in  the outpatient setting, consideration of prophylactic migraine therapy may  be appropriate.  The patient is to use ibuprofen and/or Tylenol p.r.n. mild  headaches.   #2 - ARRHYTHMIA:  The patient was admitted in normal sinus rhythm, likely  driven by an atrial pacemaker.  He did have intermittent atrial fibrillation  during the hospitalization.  The patient was discussed with Duke Salvia,  M.D., his cardiologist.  He has had an ablation in the past in addition to  placement of a pacemaker.  The patient is to be started on a new  antiarrhythmic drug by Dr. Graciela Husbands.  He was on chronic anticoagulation with  Coumadin and came in therapeutic.  This had to be reversed for the LP, and  the angiogram __________ performed.  He was placed back on his Coumadin and  covered with Lovenox following the procedures.  At the time of discharge,  his INR was up to 1.6 and he was discharged home with subcu Lovenox and p.o.  Coumadin.  This is to be followed by [name], M.D.   All other issues were stable throughout the hospitalization.   DISCHARGE LABORATORY DATA:  The following available labs on the patient were  noted on August 17, 2002, and were as follows:  White blood cell count 5.5,  hemoglobin 14.3, hematocrit 40.2, platelets 191.  Sodium 134, potassium 3.8,  chloride 101, bicarbonate 28, BUN 63, creatinine 1.1, calcium 8.5.  PT 82.3,  INR  1.6.     Kennith Gain, M.D.                  Gary Fleet, M.D.    CM/MEDQ  D:  10/02/2002  T:  10/03/2002  Job:  [id number]   cc:   [name], M.D.   Duke Salvia, M.D.

## 2010-10-08 NOTE — Consult Note (Signed)
NAME:  Antonio Hester, Antonio Hester                           ACCOUNT NO.:  [id number]   MEDICAL RECORD NO.:  [id number]                   PATIENT TYPE:  INP   LOCATION:  4739                                 FACILITY:  [facility]   PHYSICIAN:  Genene Churn. Love, M.D.                 DATE OF BIRTH:  Nov 16, 1930   DATE OF CONSULTATION:  08/09/2002  DATE OF DISCHARGE:                                   CONSULTATION   REASON FOR CONSULTATION:  The patient is a 77 year old, right-handed, white,  married male seen for evaluation of headaches.   HISTORY OF PRESENT ILLNESS:  The patient has a prior history of stroke  approximately 12 years ago and has had a known history of supraventricular  tachycardia requiring ablation of atrial fibrillation.  He has had sick  sinus syndrome.  He has had a pacemaker and has been placed on beta  blockers.  Recent interrogation of his pacemaker indicates recurrent  asymptomatic episodes of atrial fibrillation.  He was found to have  suspected seizures in October 2002 when he would have blank stares with lip  smacking movements. At that time he was hospitalized at [facility]  and seen by Dr. Noreene Filbert who placed him on Dilantin medications.  Recent  phenytoin level was low,and he was increased to 40 mg of Phenytek per day  from 300 mg per day.  His wife reports a couple of backouts in the past but  was unaware of description of complex partial seizures.   He awoke early 1 a.m. on 08/08/2002 having gone to bed by 10 p.m. on  08/07/2002.  He awoke with severe headache occurring in the posterior head  and neck region.  It was associated with nausea and slurred speech.  It was  described as the worse headache the patient ever had in his life.  He never  has headaches and does not take medication for headaches.  There is no  history of drug use, head or neck trauma, chest pain or palpitations.  It  was noted that he had some slurred speech.  He went to the emergency room,  and a  CT scan was obtained which I have reviewed.  It was normal and showed  no evidence of hydrocephalus, and no blood was noted.  An EKG showed normal  EKG without ST-T wave changes.  He had a normal serum sodium and a normal  white blood cell count.   Headache lasted approximately 16 hours and then resolved.  There was no  associated symptoms.  The patient has been on Coumadin therapy and had a  prolonged INR on arrival which is currently being reversed.  He has no  family history of aneurysm.   PAST MEDICAL HISTORY:  1. Sick sinus syndrome.  2. Atrial fibrillation.  3. Supraventricular tachycardia status post ablation, pacemaker.  4. Coronary artery disease, nonobstructive type.  5.  Chronic obstructive pulmonary disease, having quit cigarettes three years     ago.  6. Stroke with TIAs 12 years ago.  7. Complex partial seizures.   MEDICATIONS:  1. Atenolol 50 mg daily.  2. Coumadin 5 mg daily.  3. Zocor 40 mg q.h.s.  4. K-Dur 10 mEq daily.  5. Dilantin in the form of Phenytek 400 mg daily.  6. Ativan 1 mg p.r.n.  7. Diltiazem 30 mg p.r.n. for palpitations.  8. Pulmicort 0.25 mg t.i.d.  9. Atrovent and Xopenex.  10.      Aciphex 30 mg b.i.d.  11.      Tarka 120 mg b.i.d.   PHYSICAL EXAMINATION:  GENERAL:  Well-developed, pleasant white male in no  acute distress.  VITAL SIGNS:  Blood pressure right and left arm 160/80 with a left carotid  bruit and a right supraclavicular bruit heard.  Heart rate 64.  HEENT:  He had a right ptosis.  NEUROLOGIC:  Mental Status: He is alert and oriented x 3, followed one, two,  and three-step commands.  His cranial nerve examination revealed visual  fields to be full.  Disks were flat.  Extraocular movements were full, and  corneals were present.  Facial sensation was equal.  There was a right  ptosis.  Hearing was decreased.  Air conduction greater than bone  conduction.  Tongue was midline.  Uvula was midline, gags present.   Sternocleidomastoid and trapezius testing and all motor testing revealed 5/5  strength.  He had a mild tremor.  Coordination testing revealed finger-to-  nose and heel-to-shin to be normal.  Sensory examination was intact to  pinprick, touch, change in position, vibration testing.  Deep tendon  reflexes 3+, and plantar responses were downgoing.   IMPRESSION:  1. Headache (Code 784.0), rule out subarachnoid hemorrhage while on     Coumadin.  2. Complex partial seizures by history (Code 245.40).  3. Atrial fibrillation (Code 427.32).  4. Sick sinus syndrome (Code 427.9).  5. Chronic obstructive pulmonary disease (Code 496).  6. Stroke (Code 434.01).  7. Left carotid bruit (Code 785.9.   PLAN:  Get his pro time into the normal rage and do a spinal tap.                                               Genene Churn. Sandria Manly, M.D.    JML/MEDQ  D:  08/09/2002  T:  08/11/2002  Job:  [id number]   cc:   C. Ulyess Mort, M.D.  [address]  [place]  [place] [address]  Fax: [phone]

## 2010-10-08 NOTE — Discharge Summary (Signed)
   NAME:  Antonio Hester, Antonio Hester NO.:  [id number]   MEDICAL RECORD NO.:  [id number]                   PATIENT TYPE:  INP   LOCATION:  4730                                 FACILITY:  [facility]   PHYSICIAN:  Pricilla Riffle, M.D. [facility]             DATE OF BIRTH:  06-Apr-1931   DATE OF ADMISSION:  07/04/2002  DATE OF DISCHARGE:  07/05/2002                                 DISCHARGE SUMMARY   HOSPITAL COURSE:  This is a 77 year old gentleman with a past medical  history of hyperglycemia, chronic obstructive pulmonary disease with  hypercoagulable state, and also a history of atrial fibrillation.  The  patient was seen on July 04, 2002 at Dr. Lynelle Doctor office for a complaint  of increased shortness of breath and DOE.  He was noted to be in atrial  fibrillation with a rapid ventricular response.  The patient was admitted to  [facility] for CHF and atrial fibrillation.   LABORATORY DATA:  CK was 88 with an MB of 2.2, troponin 0.01.  Sodium was  129, potassium was 4, chloride 107, CO2 26, glucose 116, BUN 16, creatinine  1.2.  On day of discharge, potassium was 3.4.  He was given an extra 20 mEq  of potassium.  His INR was 3.9.  CBC:  WBC 5.5, H&H 13.2 and 38.4 with a  platelet count of 181.  BNP was 314.  The patient was on 40 of Lasix IV  b.i.d., which was discontinued on February 13.  The patient converted to a  normal sinus rhythm and was discharged the following day.   DISCHARGE MEDICATIONS:  1. He was discharged on all his previous medications with the exception of     an adjustment in his Coumadin dosage of 5 mg alternating with 2.5 mg     nightly.  2. Atenolol 50 mg daily.  3. K-Dur 10 mEq daily.  4. Pulmicort and albuterol inhalers as before.  5. Tiazac CD 120 b.i.d.  6. Zocor 40 nightly.  7. Aciphex 20 daily.  8. Phenytek 400 daily.   DIET:  He was on a low-fat/low-salt/low-cholesterol diet.    FOLLOW UP:  He was to have a PT/INR done with Dr. Luberta Robertson on  Tuesday,  February 17.  He was scheduled to see Dr. Graciela Husbands on March 8 at 12:30 p.m. and  have a 2-D echocardiogram done at the office that same day at 11:30 a.m.     [name], C.R.N.P. [facility]                 Pricilla Riffle, M.D. [facility]    DS/MEDQ  D:  07/05/2002  T:  07/05/2002  Job:  [id number]   cc:   Duke Salvia, M.D. [facility]   Shan Levans, M.D. [facility]   [name], M.D.  [place]

## 2010-10-08 NOTE — Assessment & Plan Note (Signed)
[facility]                             PULMONARY OFFICE NOTE   NAME:CLONTZRich, Paprocki                        MRN:          [id number]  DATE:05/25/2006                            DOB:          Nov 09, 1930    HISTORY OF PRESENT ILLNESS:  The patient is a 77 year old white male  patient of Dr. Delford Field who has a known history of COPD with asthmatic  bronchitic component, who presents for an acute office visit complaining  of a 2-day history of nasal congestion, postnasal drip, and wheezing  with cough.  The patient denies any purulent sputum, chest pain,  orthopnea, PND, or leg swelling.   PAST MEDICAL HISTORY:  Reviewed.   CURRENT MEDICATIONS:  Reviewed.   PHYSICAL EXAMINATION:  GENERAL:  The patient is a pleasant male in no  acute distress.  VITAL SIGNS:  He is afebrile with stable vital signs.  O2 saturations  91% on room air.  HEENT:  Nasal mucosa is slightly pale.  Posterior pharynx is clear.  NECK:  Supple without adenopathy.  No JVD.  LUNGS:  Sounds reveal few expiratory wheezes bilaterally.  HEART:  Regular rate.  ABDOMEN:  Soft and benign.  EXTREMITIES:  Warm without edema.   IMPRESSION:  Acute exacerbation of asthmatic bronchitis.  The patient is  to begin prednisone taper over the next week.  Add Mucinex DM twice  daily.  I have given the patient a prescription for Avelox x5 days to  have on hold in case his symptoms worsen with discolored sputum.  The  patient will follow back up with Dr. Delford Field as scheduled or sooner if  needed.      Rubye Oaks, NP  Electronically Signed      Charlcie Cradle Delford Field, MD, [facility]  Electronically Signed   TP/MedQ  DD: 05/26/2006  DT: 05/26/2006  Job #: [id number]

## 2010-10-08 NOTE — Op Note (Signed)
   NAME:  Antonio Hester, Antonio Hester                           ACCOUNT NO.:  [id number]   MEDICAL RECORD NO.:  [id number]                   PATIENT TYPE:  INP   LOCATION:  4739                                 FACILITY:  [facility]   PHYSICIAN:  Genene Churn. Love, M.D.                 DATE OF BIRTH:  May 22, 1931   DATE OF PROCEDURE:  08/10/2002  DATE OF DISCHARGE:                                 OPERATIVE REPORT   PROCEDURE PERFORMED:  Lumbar puncture.   INDICATIONS FOR PROCEDURE:  The patient has a history of headaches and has  been on Coumadin therapy and is currently being evaluated for the  possibility of subarachnoid hemorrhage.   DESCRIPTION OF PROCEDURE:  The patient was prepped and draped in the left  lateral decubitus position using Betadine and 1% Xylocaine.  The L4-L5  interspace was entered without difficulty.  Opening pressure was 195 mmH2O  with good variation.  Clear, colorless CSF was obtained and sent for  studies.  The patient tolerated the procedure well.                                               Genene Churn. Sandria Manly, M.D.    JML/MEDQ  D:  08/10/2002  T:  08/12/2002  Job:  [id number]

## 2010-10-08 NOTE — Assessment & Plan Note (Signed)
[facility]                             PULMONARY OFFICE NOTE   NAME:CLONTZJerry, Clyne                        MRN:          [id number]  DATE:04/19/2006                            DOB:          1930-10-22    Mr. Mcneish is a 77 year old white male, history of chronic obstructive  lung disease, reflux disease, previous left upper lobe cavitary are of  scar.  CT scanning now showing scarring in the left upper lobe without  malignancy.  The patient's level of cough and shortness of breath are  stable with no new complaints.  The patient maintains diltiazem 360 mg  daily, simvastatin 40 mg daily, rabeprazole 20 mg daily, potassium  daily, warfarin daily, dilantin 200 mg b.i.d., Pulmicort by nebulization  along with Atrovent b.i.d., oxygen 1 L h.s., Combivent p.r.n.   EXAMINATION:  VITAL SIGNS:  Temperature was 97, blood pressure 112/80,  pulse 104, saturation 98% in room air.  CHEST:  Showed distant breath sounds with prolonged expiratory phase, no  wheeze or rhonchi.  CARDIAC:  Showed a regular rate and rhythm without S3; normal S1, S2.  ABDOMEN:  Soft, nontender.  EXTREMITIES:  Showed no edema or clubbing or venous disease.  SKIN:  Clear.  NEUROLOGIC:  Intact.  HEENT:  Showed no jugular venous distention or lymphadenopathy,  oropharynx clear, neck supple.   IMPRESSION:  Chronic obstructive lung disease with asthmatic bronchitic  component.   PLAN:  The patient is to maintain inhaled medicines as currently dosed  and no change in plan of care is made.  We will see the patient back in  return followup in 3 months.     Charlcie Cradle Delford Field, MD, [facility]  Electronically Signed    PEW/MedQ  DD: 04/19/2006  DT: 04/20/2006  Job #: [id number]   cc:   Willow Ora, MD

## 2010-10-08 NOTE — Consult Note (Signed)
NAME:  Antonio Hester, Antonio Hester NO.:  [id number]   MEDICAL RECORD NO.:  [id number]          PATIENT TYPE:  INP   LOCATION:  3010                         FACILITY:  [facility]   PHYSICIAN:  Shan Levans, M.D. LHCDATE OF BIRTH:  1930-07-07   DATE OF CONSULTATION:  04/19/2005  DATE OF DISCHARGE:                                   CONSULTATION   CHIEF COMPLAINT:  Evaluate for COPD.   HISTORY OF PRESENT ILLNESS:  This is a 77 year old white male well known to  me, of whom I follow for advanced chronic obstructive lung disease,  asthmatic, bronchitic component.  This patient is on a p.r.n. home  albuterol/ipratropium nebulizer, also uses Pulmicort at home at 1-2 sprays  b.i.d.  Did have a Spiriva inhaler, but stopped using this several months  ago due to the fact that he thought it was not of any benefit to him.  Also  uses oxygen 2 liters h.s. and p.r.n. during the day.  I have not seen the  patient since May of this year.  Apparently, he developed increased  allergies this fall with increased postnasal drainage.  This culminated this  weekend with increasing sneezing, congestion, cough, and progressive  respiratory distress to the point where he was brought to the emergency room  on April 17, 2005 and admitted for further inpatient care.   PAST MEDICAL HISTORY:  Medical history of COPD, as noted above; history of  atrial fibrillation with previous RF catheter oblation; history of seizure  disorder; prostate nodule; pacemaker placement; reflux disease.   CURRENT MEDICATIONS:  1.  Diltiazem 60 mg daily.  2.  Simvastatin 40 mg daily.  3.  Pulmicort 2 sprays b.i.d.  4.  Albuterol/ipratropium and also budesonide in nebulization t.i.d. p.r.n.  5.  Dilantin 200 mg b.i.d.  6.  Prilosec 20 mg b.i.d.  7.  Potassium 20 mEq b.i.d.  8.  Coumadin dosing.   MEDICATION ALLERGIES:  PENICILLIN CAUSES A RASH.   FAMILY HISTORY:  Noncontributory.   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  Ex-smoker.   REVIEW OF SYSTEMS:  Noncontributory.   PHYSICAL EXAMINATION:  VITAL SIGNS:  Temperature 98, blood pressure 120/78,  pulse 96, respirations 18.  GENERAL:  This is a well-developed, well-nourished male in no distress.  CHEST:  Expiratory wheeze with poor air flow, bilateral left lower lobe  rhonchi.  CARDIAC EXAMINATION:  Regular rate and rhythm without S3.  Normal S1, S2.  ABDOMEN:  Soft. No organomegaly.  EXTREMITIES:  No clubbing, edema, or venous disease.  HEENT EXAMINATION:  No jugular venous distention, no lymphadenopathy.  Oropharynx clear.  NECK:  Supple.   LABORATORY DATA:  Chest x-ray showed cardiomegaly, COPD changes, no edema.  White count 7.1, hemoglobin 14.0, INR of 2.6, BNP 297, creatinine 1.2,  sodium 135, potassium 4.0.   IMPRESSION/RECOMMENDATIONS:  Despite elevated BNP, I suspect a lot of his  symptoms are sinusitis, acute bronchitis, with chronic obstructive pulmonary  disease exacerbation, exacerbated by allergic rhinitis with associated mucus  plugging.  Would recommend increasing albuterol, Atrovent, and budesonide to  q.6 h. by nebulization.  We will hold off on further Spiriva, administer IV  Solu-Medrol 40 mg q.12 h. x24 hours, and then switch to oral prednisone at  40 mg daily.  Administer a Flutter Valve four times daily.  Agree with oral  Avalox as prescribed.  Add Mucinex 2 b.i.d.  We will follow with you.      Shan Levans, M.D. [facility]  Electronically Signed     PW/MEDQ  D:  04/19/2005  T:  04/19/2005  Job:  [id number]   cc:   Rene Paci, M.D. [facility]  [address] [place], [place] [address]   Wanda Plump, MD [facility]  [phone] [address] [place], [place] [address]

## 2010-10-08 NOTE — Discharge Summary (Signed)
. [facility]  Patient:    [name], Hester Visit Number: [id number] MRN: [id number]          Service Type: EMS Location: [place] Attending Physician:  Pearletha Alfred Dictated by:   Candy Sledge, M.D. Admit Date:  03/25/2001 Discharge Date: 03/25/2001   CC:         Dr. Luberta Robertson in [place], [place].   Discharge Summary  CHIEF COMPLAINT:  Passing out spells.  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  Suddenly, he stopped talking and became unresponsive to his visitors.  Apparently, this lasted a couple of minutes but he was disoriented afterwards.  He was brought to the emergency room by the EMS.  During observation, he had another episode in the emergency room where he stopped talking.  He may have had some lip smacking activity but was able to grip the nurses hand weakly.  His wife said that he groaned.  He had a vacant look in his eyes which lasted for a couple of minutes and again he was disoriented for about 45 minutes afterwards.  There was no facial droop or obvious focal weakness.  Monitors showed no arrhythmias.  Vital signs were stable during the episode.  The patient had a history of rare spells in the past of "passing out" diagnosed as strokes without residual.  One of these was associated with documented bradyarrhythmia.  He is status post pacemaker placement and radiofrequency ablation procedure in the past year.  He has a history of atrial fibrillation on chronic Coumadin.  PAST MEDICAL HISTORY:  Positive for coronary artery disease.  History of atrial fibrillation, status post pacemaker placement and radiofrequency ablation in the past year.  He has an elevated cholesterol and a history of COPD, followed by Dr. Charlcie Cradle. Delford Field.  MEDICATIONS: 1. Protonix 40 mg p.o. q.d. 2. Toprol XL 50 mg p.o. q.d. 3. Coumadin. 4. Zocor  40 mg p.o. q.d. 5. Advair.  ALLERGIES:  There is an allergy to PENICILLIN.  SOCIAL HISTORY:  The patient is married and has two children.  He consumes mild to moderate amounts of alcohol and has a history of remote tobacco abuse. He owns a [employer].  PHYSICAL EXAMINATION:  GENERAL:  This is a well-developed, well-nourished pleasant gentleman in no acute distress.  VITAL SIGNS:  Blood pressure 135/75, pulse 68, in normal sinus rhythm. Respirations 18.  Temperature afebrile.  NEUROLOGICAL:  He was alert and oriented, although he had trouble with identifying the month.  Speech was normal.  Cranial nerve examination was nonfocal.  Motor testing reveals a negative Barre and 5/5 strength throughout. Reflexes were 1+ and symmetric with plantar responses downgoing bilaterally. Sensory and cerebellar testing were unremarkable.  LABORATORY DATA:  CT scan of the head in the emergency room was negative. Initial labs were unremarkable.  Admission CBC and differential showed white cell count 9.3 thousand, hemoglobin 15, hematocrit 45.0, platelet count 229,000.  There are 85% neutrophils, 7% lymphocytes, 7% monocytes.  Initial pro time was 17.5 seconds with an INR of 1.6 and a PTT was 34.  At the time of discharge, INR was 2.0.  Comprehensive metabolic panel showed albumin 3.3 and all other indices were within normal limits.  CT scan of the head without contrast on March 07, 2001 showed no evidence of intracranial hemorrhage and there were some "intracranial arteriosclerotic-type changes."  A  12-lead EKG showed normal sinus rhythm with a short PR and no delta waves seen.  There was no significant change from the prior tracing from November 30, 2000.  A 2-D echocardiogram showed left ventricular systolic function to be normal with an ejection fraction of 55-65%.  No left ventricular regional wall motion abnormalities were noted. Aortic valve was mildly calcified and the  left atrium was mildly dilated. Pacing wire was noted in the right ventricle.  Carotid doppler studies showed 40-60% internal carotid artery stenosis on the right and no evidence of significant ICA stenosis on the left.  Vertebral artery flow was antegrade bilaterally.  An EEG was obtained during this hospital stay which was interpreted as normal.  IMPRESSION: 1. Left brain transient ischemic attack versus complex partial seizures. 2. History of atrial fibrillation, status post pacemaker and radiofrequency    ablation. 3. Questionable chronic obstructive pulmonary disease.  PLAN:  Plan is to admit the patient.  Obtain an EEG and carotid doppler studies, as well as 2-D echocardiogram.  We will consider adding aspirin to his regimen versus an anticonvulsant depending on his course.  HOSPITAL COURSE:  The patient was admitted to the neurosciences unit.  He remained clinically stable throughout this brief hospitalization.  Blood pressure ranged between 132/76 to 156/83.  No further episodes were witnessed.  On further discussions with coworkers, the description of these episodes were really most consistent with a complex partial seizure and it was decided to initiate anticonvulsant treatment with Dilantin 300 mg p.o. q.h.s.  The patient was maintained on Coumadin with a low therapeutic INR.  On March 09, 2001, the patient was felt to have arrived at maximum hospital benefit and was discharged to home.  FINAL DIAGNOSES: 1. Complex partial seizures. 2. History of atrial fibrillation, status post pacemaker and radiofrequency    ablation. 3. Questionable history of chronic obstructive pulmonary disease.  DISCHARGE MEDICATIONS: 1. Dilantin 100 mg 3 capsules at bed time. 2. Protonix 40 mg p.o. q.d. 3. Zocor 40 mg p.o. q.d. 4. Coumadin as directed. 5. Advair. 6. Toprol XL 50 mg p.o. q.d.  DISCHARGE INSTRUCTIONS:  The patient was discharged home with instructions to  follow up with  Dr. Luberta Robertson in [facility] to regulate his pro times and to see Dr. Candy Sledge back in six to eight weeks.  He was asked to check his Dilantin level in about 10 days and not to drive until neurology follow-up could be arranged.  CONDITION ON DISCHARGE:  Stable.  Prognosis is good. Dictated by:   Candy Sledge, M.D. Attending Physician:  Pearletha Alfred DD:  04/19/01 TD:  04/19/01 Job: [id number]

## 2010-10-08 NOTE — Assessment & Plan Note (Signed)
[facility]                             PULMONARY OFFICE NOTE   NAME:CLONTZMicha, Dosanjh                        MRN:          [id number]  DATE:08/22/2006                            DOB:          28-May-1930    Mr. Antonio Hester returns today in followup.  Complains of continued runny  nose, dry cough, dyspnea is the same.   MEDICATIONS:  1. Maintains Pulmicort Respules b.i.d.  2. Atrovent b.i.d. by nebulization.  3. He also maintains Nexium 20 mg daily.  4. He is not currently on Xopenex, except b.i.d. by nebulization.  5. He is on Astelin 2 sprays each nostril b.i.d.   EXAM:  Temperature was 97, blood pressure 98/60, pulse 77, saturation  94% on room air.  CHEST:  Distant breath sounds with prolonged expiratory phase.  No  wheeze or rhonchi were noted.  CARDIAC:  Regular rate and rhythm without S3.  Normal S1, S2.  ABDOMEN:  Soft, nontender.  EXTREMITIES:  No edema or clubbing.  SKIN:  Clear.   IMPRESSION:  1. Chronic lung disease with primary emphysematous component.  2. Allergic rhinitis.   PLAN:  Continue Astelin 2 sprays each nostril b.i.d.  Maintain inhaled  medicines as currently dosed.  We will see the patient back in 4 months.     Antonio Hester Antonio Field, MD, [facility]  Electronically Signed    PEW/MedQ  DD: 08/22/2006  DT: 08/22/2006  Job #: [id number]   cc:   Willow Ora, MD

## 2010-10-08 NOTE — Op Note (Signed)
[facility]  Patient:    Antonio Hester, Antonio Hester                        MRN: [id number] Proc. Date: 11/20/00 Adm. Date:  [id number] Disc. Date: [id number] Attending:  Nathen May CC:         Dr. Diana Eves at [facility] in [facility]  Electrophysiology Laboratory   Operative Report  OPTICAL DISK:  173-A  PREOPERATIVE DIAGNOSIS:  Atrial flutter, bradycardia, and status post pacemaker implantation.  POSTOPERATIVE DIAGNOSIS:  Atrial flutter, bradycardia, and status post pacemaker implantation with inducible atrial flutter.  PROCEDURE:  Invasive electrophysiological study, arrhythmia mapping, and radiofrequency catheter ablation.  SURGEON:  Nathen May, M.D., [facility].  DESCRIPTION OF PROCEDURE:  Following obtaining informed consent, the patient was brought to the electrophysiology laboratory and placed on the fluoroscopic table in the supine position.  After routine prep and drape, cardiac catheterization was performed with local anesthesia and conscious sedation. Noninvasive blood pressure monitoring, transcutaneous oxygen saturation monitoring were performed continuously throughout the procedure.  Following the procedure, the catheter was removed.  Hemostasis was obtained, and the patient was transferred to the floor in stable condition.  CATHETERS:  A 5-French quadripolar catheter was inserted via the left femoral vein to the A-V junction.  A 7-French duodecapolar catheter was inserted via the right femoral vein to the high right atrium and the coronary sinus.  An 8-French 8 mm deflectable tip catheter was inserted via the right femoral vein using a Riley Kill (SR0) sheath and two mapping sites in the posterior septal space.  Surface leads 1, aVF, and V1 were monitored continuously throughout the procedure.  Following insertion of the catheters, a stimulation protocol included incremental atrial pacing, incremental ventricular  pacing, single atrial extra stimuli with a pace cycle length of 600 msec, burst atrial pacing.  RESULTS:  Surface electrocardiogram - initial and final:  Rhythm is sinus with atrial pacing.  Final is the same.  Cycle length is 928 msec initial and 907 msec final.  P-R interval is 122 msec initial and 139 msec final.  QRS duration is 108 msec initial and 100 msec final.  Q-T interval is 434 msec initial and 415 msec final.  P-wave duration is 108 msec initial and 118 msec final.  Bundle branch block is absent and absent.  Pre-excitation is absent and absent.  A-V nodal function - initial and final:  A-H interval of 69 msec initial and 65 msec final.  A-V Wenckebach was 300 msec initial and not assessed final. A-V nodal ________ had a pace cycle length of 600 msec and was less than or equal to 300 msec representing the functional refractory period in the atrium. A-V nodal conduction was continuous as assessed.  His-Purkinje system function - initial and final:  H-V interval is 48 msec initial and 52 msec final.  His bundle duration was 12 msec initial and N/A final.  ACCESSORY PATHWAY FUNCTION:  No evidence of an accessory pathway was identified.  Arrhythmia is induced:  Sustained atrial flutter was induced with atrial burst pacing from the coronary sinus with cycle length of 160 msec.  The atrial cycle length was 220 msec and it was ultimately terminated by atrial pacing at 160 msec.  There was counter clockwise rotation.  Entrainment mapping was not undertaken.  Nonsustained episodes of atrial tachycardia were repeatedly seen.  These had an earliest activation in the coronary sinus catheter suggesting a left atrial origin.  Fluoroscopy time:  A total of 10 minutes of fluoroscopy time was utilized at 15 frames per second.  Radiofrequency energy:  A total of 5 minutes and 1 second of radiofrequency energy was applied at times between the tricuspid annulus and the  inferior vena cava at approximately 5:30 on the tricuspid annulus.  This was accomplished during coronary sinus pacing.  Bidirectional block resulted from this and it was accomplished in the site just posterior and inferior to the coronary sinus.  The patient had a fair amount of discomfort, but still one minute of RF energy was applied at this site.  IMPRESSION: 1. Sinus bradycardia. 2. Sustained atrial flutter and nonsustained atrial tachycardia suggestive of    a left pulmonary vein focus.  Radiofrequency energy applied between the    tricuspid annulus and the inferior vena cava isthmus, successfully    interrupting conduction across the isthmus, eliminating the substrate for    atrial flutter. 3. Normal atrioventricular nodal function; indeed atrioventricular conduction    was extremely rapid.  This might be present because of the abrupt    discontinuation of his beta blocker prior to the procedure. 4. Normal His-Purkinje system function. 5. No accessory pathway. 6. Normal ventricular response to programmed stimulation as described above.  SUMMARY IN CONCLUSION:  The results of electrophysiological testing identified sustained counter clockwise flutter as it correlated with the patients typical atrial flutter.  Radiofrequency energy successfully eliminated the substrate for this flutter.  The patient did have nonsustained atrial tachycardia which is both likely the triggering event for the atrial flutter as well as potentially an arrhythmia _______.  We will need to wait to see whether the patient has significant symptoms related to his atrial arrhythmia and therapeutic options will need to be reviewed at that time.  COMPLICATIONS:  None apparent. DD:  11/29/00 TD:  11/30/00 Job: [id number]

## 2010-10-20 ENCOUNTER — Encounter: Payer: Self-pay | Admitting: Internal Medicine

## 2010-10-20 ENCOUNTER — Ambulatory Visit (INDEPENDENT_AMBULATORY_CARE_PROVIDER_SITE_OTHER): Payer: Medicare Other | Admitting: Internal Medicine

## 2010-10-20 DIAGNOSIS — N400 Enlarged prostate without lower urinary tract symptoms: Secondary | ICD-10-CM

## 2010-10-20 DIAGNOSIS — J449 Chronic obstructive pulmonary disease, unspecified: Secondary | ICD-10-CM

## 2010-10-20 DIAGNOSIS — R6251 Failure to thrive (child): Secondary | ICD-10-CM

## 2010-10-20 DIAGNOSIS — R569 Unspecified convulsions: Secondary | ICD-10-CM

## 2010-10-20 DIAGNOSIS — R627 Adult failure to thrive: Secondary | ICD-10-CM

## 2010-10-20 LAB — POCT URINALYSIS DIPSTICK
Leukocytes, UA: NEGATIVE
Protein, UA: NEGATIVE
Urobilinogen, UA: 0.2

## 2010-10-20 NOTE — Patient Instructions (Addendum)
Please consider the use of diapers Check your INR in 12 days Due to  to see your urologist next month

## 2010-10-20 NOTE — Assessment & Plan Note (Signed)
Ongoing cough, stable. No change

## 2010-10-20 NOTE — Assessment & Plan Note (Signed)
Overall improving, hoping to go home on June 5

## 2010-10-20 NOTE — Progress Notes (Signed)
  Subjective:    Patient ID: Antonio Hester, male    DOB: [birth year], 77 y.o.   MRN: [id number]  HPI  followup from the last office visit, here with his son Continue with on and off cough with occasional clear sputum.  Past Medical History  Diagnosis Date  . CAD (coronary artery disease)   . Atrial fibrillation     s/p ablation 07/2007  . Heart block     complete, and pacemaker dependence s/p defib change 06/2008  . Cardiomyopathy     non ischemic  . COPD (chronic obstructive pulmonary disease)   . BPH (benign prostatic hyperplasia)   . Seizure     d/o (sees Dr.Love, last Ov 2006)  . Hyperlipemia   . Hypertension   . Abscess of lung   . GERD (gastroesophageal reflux disease)     severe esophagitis per EGD 2007  . Osteopenia     per DEXA 10/2009  . Osteoarthritis    Past Surgical History  Procedure Date  . Pacemaker insertion     st jude  . Cardiac defibrillator placement 06/2008    Review of Systems We decreased the dose of Dilantin, they noted improvement of his mental status, no further confusion. He also discontinue oxycontin and Flexeril, back pain at baseline. He continue to make  progress on his balance, doing physical therapy at the rehabilitation place. They are considering sending him home on June 5. He had a ulcer in the mouth a few days ago, better. Also complained of urinary urgency, can not go to the bathroom fast enough. No actual dysuria or gross hematuria. Shortness of breath at baseline, no edema.    Objective:   Physical Exam  Constitutional: He is oriented to person, place, and time. He appears well-developed.       Compared to the last office visit, he definitely seems slightly stronger.  Cardiovascular: Normal rate, regular rhythm and normal heart sounds.   No murmur heard. Pulmonary/Chest:       Decreased breath sounds, a few rhonchi with cough. No respiratory distress  Musculoskeletal: He exhibits no edema.  Neurological: He is alert and oriented  to person, place, and time.  Psychiatric: He has a normal mood and affect. His behavior is normal. Thought content normal.          Assessment & Plan:

## 2010-10-20 NOTE — Assessment & Plan Note (Signed)
We decreased the dose of Dilantin since then his mental status is much better. Labs

## 2010-10-20 NOTE — Assessment & Plan Note (Signed)
Complaining of urinary urgency. He has been seen by urology in the past. Last office visit 10/2009: DRE ok , they prescribed gelnique. Plan: UA and urine culture, treat with antibiotics if appropriate. Needs to see urology 10/2010 Recommend diapers as well.

## 2010-10-21 LAB — PHENYTOIN LEVEL, TOTAL: Phenytoin Lvl: 1.8 ug/mL — ABNORMAL LOW (ref 10.0–20.0)

## 2010-10-22 ENCOUNTER — Telehealth: Payer: Self-pay | Admitting: *Deleted

## 2010-10-22 NOTE — Telephone Encounter (Signed)
Pts son called back and left message- left message for him to return call.

## 2010-10-22 NOTE — Telephone Encounter (Signed)
Message left for patient to return my call.  

## 2010-10-22 NOTE — Telephone Encounter (Signed)
Message copied by Leanne Lovely on Fri Oct 22, 2010  9:44 AM ------      Message from: Willow Ora E      Created: Thu Oct 21, 2010  5:30 PM       Findings patient's son: Dilantin level is low, no change, continue same dosage. Urine culture pending

## 2010-10-23 LAB — CULTURE, URINE COMPREHENSIVE

## 2010-10-25 ENCOUNTER — Telehealth: Payer: Self-pay | Admitting: *Deleted

## 2010-10-25 MED ORDER — CIPROFLOXACIN HCL 500 MG PO TABS: 500.0000 mg | ORAL_TABLET | Freq: Two times a day (BID) | ORAL | Status: AC

## 2010-10-25 NOTE — Telephone Encounter (Signed)
Pts son Roseanne Reno is aware.

## 2010-10-25 NOTE — Telephone Encounter (Signed)
Message left for patient to return my call.  

## 2010-10-25 NOTE — Telephone Encounter (Signed)
Pt's son is aware

## 2010-10-25 NOTE — Telephone Encounter (Signed)
Message copied by Leanne Lovely on Mon Oct 25, 2010  2:02 PM ------      Message from: Willow Ora E      Created: Mon Oct 25, 2010  1:31 PM       Advise patient, urine culture was positive, could be an infection versus a colonization.      He c/o  urinary frequency thus I like to start him on ciprofloxacin 500 mg twice a day for one week. INR needs to be checked 5 days after he start abx.

## 2010-11-01 ENCOUNTER — Ambulatory Visit (INDEPENDENT_AMBULATORY_CARE_PROVIDER_SITE_OTHER): Payer: Medicare Other | Admitting: Internal Medicine

## 2010-11-01 ENCOUNTER — Encounter: Payer: Self-pay | Admitting: Internal Medicine

## 2010-11-01 DIAGNOSIS — H532 Diplopia: Secondary | ICD-10-CM

## 2010-11-01 DIAGNOSIS — N400 Enlarged prostate without lower urinary tract symptoms: Secondary | ICD-10-CM

## 2010-11-01 NOTE — Assessment & Plan Note (Addendum)
New problem: Developed diplopia one day after he started to use new glasses, optometrist saw him and does not believe symptoms are related to glasses. Neurological exam is at baseline He is on Dilantin ,  Dilantin intoxication could cause some neurologic side effects but diplopia is not a classic sign of intoxication; also, his last Dilantin level was low. A/P: Etiology unclear, Refer to neurology, patient will call if symptoms increase, change or if he has associated symptoms such as headache or slurred speech

## 2010-11-01 NOTE — Assessment & Plan Note (Addendum)
Today he complains of nocturia, chart is reviewed, he has a long history of urinary symptoms, has seen urology at least twice, on 10/2009, she was Rx gelnique, he used it for a while but it "stopped helping" and so he quit. Currently on Cipro empirically for possible UTI. Recommend to see urologist, he already has an appointment.

## 2010-11-01 NOTE — Progress Notes (Signed)
  Subjective:    Patient ID: Antonio Hester, male    DOB: [birth year], 77 y.o.   MRN: [id number]  HPI Here w/ his son: Complaining of diplopia since June 1. The first week, he had 2 or 3 episodes, they may last an hour or so, in the last few days the episodes have been more frequent, essentially daily, they may last a few hours. He got a new pair of glasses on May 31, he went back to his optometrist and he was told that the glasses and his eyes were okay.  He also continue with nocturia about 4 times a night. Recently started on empiric antibiotics for possible UTI , at the time he had urinary urgency and that has not improved   Past Medical History  Diagnosis Date  . CAD (coronary artery disease)   . Atrial fibrillation     s/p ablation 07/2007  . Heart block     complete, and pacemaker dependence s/p defib change 06/2008  . Cardiomyopathy     non ischemic  . COPD (chronic obstructive pulmonary disease)   . BPH (benign prostatic hyperplasia)   . Seizure     d/o (sees Dr.Love, last Ov 2006)  . Hyperlipemia   . Hypertension   . Abscess of lung   . GERD (gastroesophageal reflux disease)     severe esophagitis per EGD 2007  . Osteopenia     per DEXA 10/2009  . Osteoarthritis    Past Surgical History  Procedure Date  . Pacemaker insertion     st jude  . Cardiac defibrillator placement 06/2008     Review of Systems No headaches, slurred speech or blurred vision No difficulty urinating, dysuria or gross hematuria.     Objective:   Physical Exam  Constitutional: He appears well-developed and well-nourished. No distress.  Cardiovascular: Normal rate, regular rhythm and normal heart sounds.   Pulmonary/Chest:       Decreased breath sounds otherwise normal  Neurological:       Speech is at baseline, slightly slow. External ocular movements intact without nystagmus. Motor exam symmetric. Gait is difficult,  uses a walker, at baseline  Skin: He is not diaphoretic.           Assessment & Plan:  Today , I spent more than 25 min with the patient, >50% of the time counseling, and reviewing the chart

## 2010-11-05 ENCOUNTER — Ambulatory Visit: Payer: Medicare Other | Admitting: Internal Medicine

## 2010-11-15 ENCOUNTER — Telehealth: Payer: Self-pay | Admitting: Critical Care Medicine

## 2010-11-15 DIAGNOSIS — J449 Chronic obstructive pulmonary disease, unspecified: Secondary | ICD-10-CM

## 2010-11-15 NOTE — Telephone Encounter (Signed)
[id number]. Looks like refill was sent in may for brovana. Also have they got the meds filled at [facility] before? They may need to go to DME. Carron Curie, CMA

## 2010-11-16 MED ORDER — ARFORMOTEROL TARTRATE 15 MCG/2ML IN NEBU: 15.0000 ug | INHALATION_SOLUTION | Freq: Two times a day (BID) | RESPIRATORY_TRACT | Status: DC

## 2010-11-16 MED ORDER — BUDESONIDE 0.25 MG/2ML IN SUSP: 0.2500 mg | Freq: Two times a day (BID) | RESPIRATORY_TRACT | Status: DC

## 2010-11-16 MED ORDER — IPRATROPIUM BROMIDE HFA 17 MCG/ACT IN AERS: 1.0000 | INHALATION_SPRAY | Freq: Two times a day (BID) | RESPIRATORY_TRACT | Status: DC

## 2010-11-16 NOTE — Telephone Encounter (Signed)
PATIENT'S SON RETURNED CALL.  PLEASE CALL BACK AT [phone]

## 2010-11-16 NOTE — Telephone Encounter (Signed)
Pt son states pt needs refills on his brovana and pulmicort sent to [facility] and atrovent inhaler sent to [facility]. atrovent sent to [facility] and order placed to get meds from [facility] and rx printed for PW to sign.Carron Curie, CMA

## 2010-11-18 ENCOUNTER — Telehealth: Payer: Self-pay | Admitting: Critical Care Medicine

## 2010-11-18 NOTE — Telephone Encounter (Signed)
Should be on 0.25mg 

## 2010-11-18 NOTE — Telephone Encounter (Signed)
Spoke with pt's son and notified pt is on the correct dose with taking the 0.25 mg pulmicort. He verbalized understanding.

## 2010-11-18 NOTE — Telephone Encounter (Signed)
I spoke with pt son and he states the pt was on pulmicort 0.5 twice daily but the rx he received was for pulmicort 0.25 twice daily which is what is on his med list. Pt son wants to clarify what strength he pt is supposed to be on. Please advise.Carron Curie, CMA

## 2010-11-22 ENCOUNTER — Other Ambulatory Visit: Payer: Self-pay | Admitting: Internal Medicine

## 2010-11-22 MED ORDER — CELECOXIB 100 MG PO CAPS: 100.0000 mg | ORAL_CAPSULE | Freq: Two times a day (BID) | ORAL | Status: DC | PRN

## 2010-11-22 NOTE — Telephone Encounter (Signed)
Ok 60, 3 RF 

## 2010-11-22 NOTE — Telephone Encounter (Signed)
Should pt be taking Vitamin D?

## 2010-11-22 NOTE — Telephone Encounter (Signed)
Okay to fill? 

## 2010-11-23 NOTE — Telephone Encounter (Signed)
Left message for pt's son

## 2010-11-23 NOTE — Telephone Encounter (Signed)
After 3 months of ergocalciferol, he doe  Not need more. Rec. calcium and vitamin D over-the-counter

## 2010-12-17 ENCOUNTER — Telehealth: Payer: Self-pay | Admitting: Internal Medicine

## 2010-12-17 NOTE — Telephone Encounter (Addendum)
Pt son called has been referred to [facility] since that's who [facility] neuro uses and they would like to know who Dr. Drue Novel may recommend pt is being referred because he has an occlusion of 1 carotid area seen on doppler.

## 2010-12-17 NOTE — Telephone Encounter (Signed)
Message left for patient to return my call.  

## 2010-12-17 NOTE — Telephone Encounter (Signed)
Advise pt's son: I would have to see the notes from neurology, results of the Dopplers before I give any opinion however If he has a blockage in the carotid artery and the neurologist likes him to see a surgeon.....most likely I will agree with that referral

## 2010-12-20 NOTE — Telephone Encounter (Signed)
Message left for patient to return my call.  

## 2010-12-21 NOTE — Telephone Encounter (Signed)
Pt's son is aware

## 2010-12-21 NOTE — Telephone Encounter (Signed)
Message left for patient to return my call.  

## 2010-12-27 ENCOUNTER — Ambulatory Visit (INDEPENDENT_AMBULATORY_CARE_PROVIDER_SITE_OTHER): Payer: Medicare Other | Admitting: Internal Medicine

## 2010-12-27 ENCOUNTER — Encounter: Payer: Self-pay | Admitting: Internal Medicine

## 2010-12-27 DIAGNOSIS — J449 Chronic obstructive pulmonary disease, unspecified: Secondary | ICD-10-CM

## 2010-12-27 DIAGNOSIS — H532 Diplopia: Secondary | ICD-10-CM

## 2010-12-27 DIAGNOSIS — M549 Dorsalgia, unspecified: Secondary | ICD-10-CM

## 2010-12-27 DIAGNOSIS — N400 Enlarged prostate without lower urinary tract symptoms: Secondary | ICD-10-CM

## 2010-12-27 DIAGNOSIS — I10 Essential (primary) hypertension: Secondary | ICD-10-CM

## 2010-12-27 NOTE — Assessment & Plan Note (Addendum)
Stable Still complaining of fatigue, wonders if related to COPD; last chest x-ray 3 months ago showing atelectasis, labs from the last few months reviewed and essentially stable. For now recommend observation

## 2010-12-27 NOTE — Patient Instructions (Signed)
For cough, take Mucinex DM twice a day or Robitussin DM 4 times a day as needed

## 2010-12-27 NOTE — Assessment & Plan Note (Addendum)
Still has diplopia, saw  neurology, workup shows severe carotid stenosis, already referred to vascular surgery.

## 2010-12-27 NOTE — Progress Notes (Signed)
  Subjective:    Patient ID: Antonio Hester, male    DOB: June 23, 1930, 77 y.o.   MRN: [id number]  HPI Routine followup, here with his son.  Past Medical History  Diagnosis Date  . CAD (coronary artery disease)   . Atrial fibrillation     s/p ablation 07/2007  . Heart block     complete, and pacemaker dependence s/p defib change 06/2008  . Cardiomyopathy     non ischemic  . COPD (chronic obstructive pulmonary disease)   . BPH (benign prostatic hyperplasia)   . Seizure     d/o (sees Dr.Love, last Ov 2006)  . Hyperlipemia   . Hypertension   . Abscess of lung   . GERD (gastroesophageal reflux disease)     severe esophagitis per EGD 2007  . Osteopenia     per DEXA 10/2009  . Osteoarthritis    Past Surgical History  Procedure Date  . Pacemaker insertion     st jude  . Cardiac defibrillator placement 06/2008     Review of Systems Recently seen with diplopia, symptoms are about the same, he saw neurology, found to have severe carotid vascular disease, to see vascular surgery soon. Continue with weakness, about the same as before As far as his COPD, he is taking oxygen with him 24/7, continue with cough without hemoptysis. Should he take guaifenesin? Blood pressure today slightly elevated but usually is okay. As far as the back pain, still taking Celebrex, denies any nausea, vomiting, stomach pain. She was diagnosed with a UTI a couple of months ago, eventually saw a urologist and was started on Enablex (dose?) At the present time denies dysuria or gross hematuria. No difficulty urinating.       Objective:   Physical Exam  Constitutional: He is oriented to person, place, and time. He appears well-developed and well-nourished.  Cardiovascular: Normal rate, regular rhythm and normal heart sounds.   No murmur heard. Pulmonary/Chest:       Decreased breath sounds otherwise clear without wheezing or crackles  Musculoskeletal: He exhibits no edema.  Neurological: He is alert and  oriented to person, place, and time.          Assessment & Plan:  Multiple general questions from the patient and his son answered to the best of my ability, time spent more than 20 minutes

## 2010-12-27 NOTE — Assessment & Plan Note (Addendum)
Persisting back pain, relatively well controlled with Celebrex. I am still concerned about potential GI side effects, shared  my concerns with the patient and his son. We agreed to do a trial w/o celebrex to see how he is doing. Consider Vicodin or another pain killer.

## 2010-12-27 NOTE — Assessment & Plan Note (Addendum)
Saw urology recently, prescribed Enablex

## 2010-12-27 NOTE — Assessment & Plan Note (Signed)
BP slightly elevated today but usually better. No change

## 2011-01-04 ENCOUNTER — Ambulatory Visit (INDEPENDENT_AMBULATORY_CARE_PROVIDER_SITE_OTHER): Payer: Medicare Other | Admitting: Vascular Surgery

## 2011-01-04 ENCOUNTER — Encounter: Payer: Self-pay | Admitting: Vascular Surgery

## 2011-01-04 VITALS — BP 164/89 | HR 78 | Resp 16 | Ht 71.0 in | Wt 163.5 lb

## 2011-01-04 DIAGNOSIS — I6529 Occlusion and stenosis of unspecified carotid artery: Secondary | ICD-10-CM

## 2011-01-04 NOTE — Progress Notes (Deleted)
Subjective:     Patient ID: Antonio Hester, male   DOB: 17-Jun-1930, 77 y.o.   MRN: [id number]  HPI   Review of Systems     Objective:   Physical Exam     Assessment:     ***    Plan:     ***

## 2011-01-04 NOTE — Progress Notes (Signed)
Subjective:     Patient ID: Antonio Hester, male   DOB: 06/16/1930, 77 y.o.   MRN: [id number]  HPI The patient presents today for evaluation of extracranial cerebrovascular occlusive disease. He undergone a evaluation for double vision and dizziness. Carotid duplex revealed occlusion of his right internal carotid artery and a 60-79% stenosis of his left internal carotid artery. CT of his head without contrast revealed old small bilateral cerebellar and right frontal infarction. He does have a history of a stroke 10-15 years ago. He and his family report that there was no focal deficits at that time.  Review of Systems  Constitutional: Positive for fatigue.  Cardiovascular: Positive for palpitations.   GI hiatal hernia. Urinary frequency. Pulmonary home oxygen. Cardiac: shortness of breath with lying flat and exertion. Otherwise negative. Past Medical History  Diagnosis Date  . CAD (coronary artery disease)   . Atrial fibrillation     s/p ablation 07/2007  . Heart block     complete, and pacemaker dependence s/p defib change 06/2008  . Cardiomyopathy     non ischemic  . COPD (chronic obstructive pulmonary disease)   . BPH (benign prostatic hyperplasia)   . Seizure     d/o (sees Dr.Love, last Ov 2006)  . Hyperlipemia   . Hypertension   . Abscess of lung   . GERD (gastroesophageal reflux disease)     severe esophagitis per EGD 2007  . Osteopenia     per DEXA 10/2009  . Osteoarthritis   . Carotid artery occlusion     right ICA -occluded/ left ICA 60-79% stenosis    History  Substance Use Topics  . Smoking status: Former Smoker -- 2.0 packs/day for 50 years    Types: Cigarettes    Quit date: 01/04/1996  . Smokeless tobacco: Never Used  . Alcohol Use: 4.0 oz/week    8 drink(s) per week     wine sometimes    Family History  Problem Relation Age of Onset  . Heart disease Mother   . Heart disease Father     Allergies  Allergen Reactions  . Penicillins     REACTION: rash     Current outpatient prescriptions:albuterol (PROVENTIL) (2.5 MG/3ML) 0.083% nebulizer solution, Take 2.5 mg by nebulization 2 (two) times daily as needed. , Disp: , Rfl: ;  albuterol-ipratropium (COMBIVENT) 18-103 MCG/ACT inhaler, Inhale 2 puffs into the lungs 3 (three) times daily as needed. , Disp: , Rfl: ;  Alum Hydroxide-Mag Trisilicate (GAVISCON) 80-14.2 MG CHEW, Chew 1 tablet by mouth 2 (two) times daily as needed.  , Disp: , Rfl:  arformoterol (BROVANA) 15 MCG/2ML NEBU, Take 2 mLs (15 mcg total) by nebulization 2 (two) times daily., Disp: 120 mL, Rfl: 6;  budesonide (PULMICORT) 0.25 MG/2ML nebulizer solution, Take 2 mLs (0.25 mg total) by nebulization 2 (two) times daily., Disp: 120 mL, Rfl: 6;  carvedilol (COREG) 3.125 MG tablet, Take 3.125 mg by mouth daily.  , Disp: , Rfl:  celecoxib (CELEBREX) 100 MG capsule, Take 1 capsule (100 mg total) by mouth 2 (two) times daily as needed., Disp: 180 capsule, Rfl: 0;  darifenacin (ENABLEX) 15 MG 24 hr tablet, Take 15 mg by mouth daily.  , Disp: , Rfl: ;  ipratropium (ATROVENT HFA) 17 MCG/ACT inhaler, Inhale 1 puff into the lungs 2 (two) times daily., Disp: 1 Inhaler, Rfl: 3;  lisinopril (PRINIVIL,ZESTRIL) 5 MG tablet, Take 5 mg by mouth daily.  , Disp: , Rfl:  loratadine (CLARITIN) 10 MG tablet, Take  10 mg by mouth daily.  , Disp: , Rfl: ;  phenytoin (DILANTIN) 100 MG ER capsule, Take 100 mg by mouth daily. , Disp: , Rfl: ;  polyethylene glycol (MIRALAX / GLYCOLAX) packet, Take 17 g by mouth daily.  , Disp: , Rfl: ;  simvastatin (ZOCOR) 40 MG tablet, 1/2 by mouth once daily. , Disp: , Rfl: ;  warfarin (COUMADIN) 5 MG tablet, 5 mg. Take 1/2 tablet by mouth once daily, Disp: , Rfl:   Filed Vitals:   01/04/11 1028  Height: 5\' 11"  (1.803 m)  Weight: 163 lb 8 oz (74.163 kg)    Body mass index is 22.80 kg/(m^2).          Objective:   Physical Exam Well-developed well-nourished frail white male appearing his stated age. Blood pressure 164/89. Heart  rate 78. Respirations 16. Neuro: no focal deficits . Radial and femoral pulses 2+ bilaterally. Carotid arteries without bruits bilaterally.    Assessment:     Asymptomatic right internal carotid artery occlusion with moderate 60-79% left internal carotid artery stenosis.    Plan:     I discussed this at length with the patient and his son present. Explained that there is no treatment required or possible with his right internal carotid artery occlusion. I've recommended six-month hollow of carotid duplex in our office. He was notify us if he develops any focal neurologic deficits.

## 2011-02-02 ENCOUNTER — Ambulatory Visit (INDEPENDENT_AMBULATORY_CARE_PROVIDER_SITE_OTHER): Payer: Medicare Other | Admitting: Internal Medicine

## 2011-02-02 ENCOUNTER — Encounter: Payer: Self-pay | Admitting: Internal Medicine

## 2011-02-02 ENCOUNTER — Ambulatory Visit (INDEPENDENT_AMBULATORY_CARE_PROVIDER_SITE_OTHER)
Admission: RE | Admit: 2011-02-02 | Discharge: 2011-02-02 | Disposition: A | Discharge status: Home / Self Care | Payer: Medicare Other | Source: Ambulatory Visit | Attending: Internal Medicine | Admitting: Internal Medicine

## 2011-02-02 VITALS — BP 140/84 | HR 98 | Wt 159.0 lb

## 2011-02-02 DIAGNOSIS — M549 Dorsalgia, unspecified: Secondary | ICD-10-CM

## 2011-02-02 DIAGNOSIS — M1990 Unspecified osteoarthritis, unspecified site: Secondary | ICD-10-CM

## 2011-02-02 DIAGNOSIS — H532 Diplopia: Secondary | ICD-10-CM

## 2011-02-02 DIAGNOSIS — J069 Acute upper respiratory infection, unspecified: Secondary | ICD-10-CM

## 2011-02-02 NOTE — Assessment & Plan Note (Addendum)
Status post evaluation by neurology, they they referred him to cardiovascular surgery, he had asymptomatic right carotid artery. They did not recommend surgery. Patient is still concerned about this issue. Options discussed: See nero again or see his eye doctor again. He declined both options , likes  to see ophthalmology. We'll arrange referral.

## 2011-02-02 NOTE — Progress Notes (Signed)
  Subjective:    Patient ID: Antonio Hester, male    DOB: 1930/12/06, 77 y.o.   MRN: [id number]  HPI Here w/ his son, several issues: Developed some sore throat and mild cough a few days ago, overall better. Continue with diplopia, has seen the neurologist, the cardiovascular surgeon, his optometrist twice. Still concerned about it. Continue with back pain, symptoms relatively well controlled with Tylenol and Celebrex. Wonders what else can be done. Complaining of the "popping and cracking feeling" in the neck whenever he turns his head. Denies actual pain, no bladder or bowel incontinence per se, no upper or lower extremity paresthesias.   Past Medical History  Diagnosis Date  . CAD (coronary artery disease)   . Atrial fibrillation     s/p ablation 07/2007  . Heart block     complete, and pacemaker dependence s/p defib change 06/2008  . Cardiomyopathy     non ischemic  . COPD (chronic obstructive pulmonary disease)   . BPH (benign prostatic hyperplasia)   . Seizure     d/o (sees Dr.Love, last Ov 2006)  . Hyperlipemia   . Hypertension   . Abscess of lung   . GERD (gastroesophageal reflux disease)     severe esophagitis per EGD 2007  . Osteopenia     per DEXA 10/2009  . Osteoarthritis   . Carotid artery occlusion     right ICA -occluded/ left ICA 60-79% stenosis   Past Surgical History  Procedure Date  . Pacemaker insertion     st jude  . Cardiac defibrillator placement 06/2008    Review of Systems See history of present illness.     Objective:   Physical Exam  Constitutional: He is oriented to person, place, and time. He appears well-developed and well-nourished.  HENT:  Nose: Nose normal.  Mouth/Throat: No oropharyngeal exudate.  Neck:       Nontender to palpation of the cervical spine. Range of motion is limited throughout.  Pulmonary/Chest:       Decreased  sounds, otherwise clear.    Musculoskeletal: He exhibits no edema.  Neurological: He is alert and oriented  to person, place, and time.          Assessment & Plan:  URI type of symptoms: Resolving, physical exam is benign. Recommend observation.  Today , I spent more than  min with the patient, >50% of the time counseling, and /or reviewing the chart and labs ordered by other providers

## 2011-02-02 NOTE — Assessment & Plan Note (Addendum)
Ongoing back pain, felt to be due to a vertebral fracture. Currently symptoms are relatively well controlled with Tylenol and Celebrex. The patient wants to know what else can be done to stop the pain and increase his mobility. I mentioned a vertebroplasty but at the same time told him that if his pain is relatively well-controlled it may not be indicated in his case. I also mentioned possible complications from the procedure particularly because he takes Coumadin. The patient nevertheless would like to explore this possibility. I recommended him to call back his orthopedic doctor for evaluation of back pain. In addition, he has made "popping and cracking" no paresthesias, suspect neck DJD. X-ray will be done. I will send this note to the orthopedic doctor to let him know what I advised the patient today.

## 2011-02-04 ENCOUNTER — Telehealth: Payer: Self-pay

## 2011-02-04 NOTE — Telephone Encounter (Signed)
Message copied by Beverely Low on Fri Feb 04, 2011  8:23 AM ------      Message from: Antonio Hester      Created: Thu Feb 03, 2011  7:34 PM       advise the patient, x-rays showed advanced arthritis as expected--->  plan is the same

## 2011-02-04 NOTE — Telephone Encounter (Signed)
Left message to notify pt  

## 2011-03-04 LAB — DIFFERENTIAL
Eosinophils Absolute: 0.2
Eosinophils Relative: 3
Lymphocytes Relative: 23
Lymphs Abs: 1.2
Monocytes Relative: 11

## 2011-03-04 LAB — URINE CULTURE
Colony Count: 4000
Special Requests: NEGATIVE

## 2011-03-04 LAB — BASIC METABOLIC PANEL
BUN: 12
CO2: 28
Chloride: 106
Creatinine, Ser: 0.87
Glucose, Bld: 90
Potassium: 3.9

## 2011-03-04 LAB — COMPREHENSIVE METABOLIC PANEL
ALT: 16
AST: 17
Albumin: 3.9
CO2: 26
Calcium: 8.7
Creatinine, Ser: 1.13
GFR calc Af Amer: >60
GFR calc non Af Amer: >60
Sodium: 137
Total Protein: 6.7

## 2011-03-04 LAB — URINALYSIS, ROUTINE W REFLEX MICROSCOPIC
Glucose, UA: NEGATIVE
Hgb urine dipstick: NEGATIVE
Protein, ur: NEGATIVE
pH: 7

## 2011-03-04 LAB — CBC
MCHC: 34.6
MCV: 95.2
Platelets: 178
RBC: 4.42
RDW: 14

## 2011-03-04 LAB — PROTIME-INR
INR: 1.8 — ABNORMAL HIGH
Prothrombin Time: 21.6 — ABNORMAL HIGH
Prothrombin Time: 21.2 — ABNORMAL HIGH

## 2011-03-04 LAB — PHENYTOIN LEVEL, TOTAL: Phenytoin Lvl: 6.3 — ABNORMAL LOW

## 2011-03-04 LAB — HEMOGLOBIN AND HEMATOCRIT, BLOOD: HCT: 38.5 — ABNORMAL LOW

## 2011-03-30 ENCOUNTER — Ambulatory Visit: Payer: Medicare Other | Admitting: Internal Medicine

## 2011-04-21 ENCOUNTER — Other Ambulatory Visit: Payer: Self-pay | Admitting: Internal Medicine

## 2011-05-04 ENCOUNTER — Ambulatory Visit (INDEPENDENT_AMBULATORY_CARE_PROVIDER_SITE_OTHER): Payer: Medicare Other | Admitting: Internal Medicine

## 2011-05-04 VITALS — BP 110/62 | HR 80 | Temp 98.1°F | Ht 67.5 in | Wt 163.0 lb

## 2011-05-04 DIAGNOSIS — M549 Dorsalgia, unspecified: Secondary | ICD-10-CM

## 2011-05-04 DIAGNOSIS — I10 Essential (primary) hypertension: Secondary | ICD-10-CM

## 2011-05-04 DIAGNOSIS — J449 Chronic obstructive pulmonary disease, unspecified: Secondary | ICD-10-CM

## 2011-05-04 DIAGNOSIS — H532 Diplopia: Secondary | ICD-10-CM

## 2011-05-04 DIAGNOSIS — Z23 Encounter for immunization: Secondary | ICD-10-CM

## 2011-05-04 MED ORDER — CLOTRIMAZOLE-BETAMETHASONE 1-0.05 % EX CREA: TOPICAL_CREAM | Freq: Two times a day (BID) | CUTANEOUS | Status: DC

## 2011-05-04 NOTE — Patient Instructions (Signed)
Came back in 4 to 6 months and as needed

## 2011-05-04 NOTE — Progress Notes (Signed)
  Subjective:    Patient ID: Antonio Hester, male    DOB: February 04, 1931, 77 y.o.   MRN: [id number]  HPI Routine office visit, here with his son. In General He Feels Okay and Seems to Be Stable.   Past Medical History: Cardiovascular  CAD  Atrial fibrillation s/p ablation 07-2007 h/o complete heart block and pacemaker dependence , s/p defib change 06-2008 Non-ischemic cardiomyopathy Coumadin f/u at [name] office  ----------------------------------------- COPD H/o a LUNG ABSCESS  BPH SZ d/o (sees [name], last OV 2006) Hyperlipidemia Hypertension GERD-- severe esophagitis per EGD 2007 osteopenia --per DEXA  6/11 osteoarthritis MDs--Dr Love, [facility] ([name] @ [facility], [name] @ [facility]), pulmonary [name] , urology  name?, GI [name]  Past Surgical History: Pacemaker: (St. Jude) Defibrillator- 06/2008  Social History: lives at [facility], has an apartment children x 2 , son ([place]) and daughter Jeanice Lim) Former Smoke ETOH-wine sometimes     Review of Systems Developed a rash 2 days ago at the umbilicus and left leg. Using hydrocortisone cream, rash looks better. He has chronic back pain, "it was never too serious", but the last time they requested a referral for consideration of a vertebroplasty but they didn't go. Diplopia, went to see neurology today, they recommend a low dose of aspirin every other day for possible "circulation issues" he also has a difficult gait, they didn't have any other suggestions, they were  told possibly related to neuropathy. Breathing is okay, occasional coughing spells that decrease with cough drops. They're considering a bladder electrical stimulator.    Objective:   Physical Exam  Constitutional: He appears well-developed.       Slightly underwt appearing  Cardiovascular: Normal rate, regular rhythm and normal heart sounds.   No murmur heard. Pulmonary/Chest:       Decreased breath sounds, rhonchi bilaterally, on oxygen,  no apparent increased work of breathing  Musculoskeletal: He exhibits no edema.  Skin:       Umbilicus with a superficial erythematous rash, no blisters, no swelling, no warmth. he has a numular, 2 cm lesion at the L leg anteriorly   Psychiatric: He has a normal mood and affect. His behavior is normal.      Assessment & Plan:  Rash: Fungal infection?, Prescribed Lotrisone

## 2011-05-04 NOTE — Assessment & Plan Note (Signed)
Got a flu shot today, occasional cough but overall seems stable

## 2011-05-04 NOTE — Assessment & Plan Note (Signed)
Today, the pain is described as not too serious, they decided not to pursue a vertebroplasty

## 2011-05-04 NOTE — Assessment & Plan Note (Signed)
BP today normal, check a BMP

## 2011-05-04 NOTE — Assessment & Plan Note (Signed)
Saw neurology today, he was recommended to take a low dose of aspirin every other day

## 2011-05-05 LAB — BASIC METABOLIC PANEL
Chloride: 106 mEq/L (ref 96–112)
GFR: 64.56 mL/min (ref >60.00)
Glucose, Bld: 97 mg/dL (ref 70–99)
Potassium: 4.1 mEq/L (ref 3.5–5.1)
Sodium: 143 mEq/L (ref 135–145)

## 2011-05-06 ENCOUNTER — Encounter: Payer: Self-pay | Admitting: *Deleted

## 2011-05-09 ENCOUNTER — Ambulatory Visit (INDEPENDENT_AMBULATORY_CARE_PROVIDER_SITE_OTHER): Payer: Medicare Other | Admitting: Critical Care Medicine

## 2011-05-09 ENCOUNTER — Encounter: Payer: Self-pay | Admitting: Critical Care Medicine

## 2011-05-09 VITALS — BP 120/54 | HR 77 | Temp 97.5°F | Ht 67.0 in | Wt 163.0 lb

## 2011-05-09 DIAGNOSIS — J449 Chronic obstructive pulmonary disease, unspecified: Secondary | ICD-10-CM

## 2011-05-09 MED ORDER — IPRATROPIUM-ALBUTEROL 18-103 MCG/ACT IN AERO: 2.0000 | INHALATION_SPRAY | Freq: Three times a day (TID) | RESPIRATORY_TRACT | Status: DC | PRN

## 2011-05-09 MED ORDER — CEFUROXIME AXETIL 250 MG PO TABS: 250.0000 mg | ORAL_TABLET | Freq: Two times a day (BID) | ORAL | Status: AC

## 2011-05-09 NOTE — Patient Instructions (Signed)
Take ceftin one twice daily for 5 days No other medication changes Stay on nebulizer twice daily Use combivent as needed for shortness of breath Stop atrovent Return 3 months [place]

## 2011-05-09 NOTE — Progress Notes (Signed)
Subjective:    Patient ID: Antonio Hester, male    DOB: 1930/06/26, 77 y.o.   MRN: [id number]  HPI  78 y.o.WM copd 5/12 Pt sent to rehab after adm 4/20- 4/25.  Since d/c dyspnea is better.  Pt notes less pain with T12 fx Notes a lot of instability in walking and gait and unable to get up and down.  Now on more oxygen. No real mucus, but does cough more.  Notes some DOE.  No qhs dyspnea.    12/17 At last ov we rec: Stay on oxygen at 3 liters [facility] , will order through [employer] one twice daily in nebulizer No change in atrovent or pulmicort Since last ov back and bladder issues.  Went rehab 4/12- 6/12.  Hosp for a week then went [place] [facility].  Now at [facility] in independent living area.  Spouse with dementia.   Now notes more cough and mucus for one month.  Mucus now is not coming up.  Cough paroxysm for .  No chest pain. No edema in feet.   Notes dyspnea with exertion and rest.  Has a R groin hernia. No real nasal congestion, just a chronic pn drip.   Past Medical History  Diagnosis Date  . CAD (coronary artery disease)   . Atrial fibrillation     s/p ablation 07/2007  . Heart block     complete, and pacemaker dependence s/p defib change 06/2008  . Cardiomyopathy     non ischemic  . COPD (chronic obstructive pulmonary disease)   . BPH (benign prostatic hyperplasia)   . Seizure     d/o (sees Dr.Love, last Ov 2006)  . Hyperlipemia   . Hypertension   . Abscess of lung   . GERD (gastroesophageal reflux disease)     severe esophagitis per EGD 2007  . Osteopenia     per DEXA 10/2009  . Osteoarthritis   . Carotid artery occlusion     right ICA -occluded/ left ICA 60-79% stenosis     Family History  Problem Relation Age of Onset  . Heart disease Mother   . Heart disease Father      History   Social History  . Marital Status: Married    Spouse Name: N/A    Number of Children: 2  . Years of Education: N/A   Occupational History  . Not on file.   Social  History Main Topics  . Smoking status: Former Smoker -- 2.0 packs/day for 50 years    Types: Cigarettes    Quit date: 01/04/1996  . Smokeless tobacco: Never Used  . Alcohol Use: 4.0 oz/week    8 drink(s) per week     wine sometimes  . Drug Use: Not on file  . Sexually Active: Not on file   Other Topics Concern  . Not on file   Social History Narrative   Lives at [facility], has an apartment, lives w/ wife, has a dog     Allergies  Allergen Reactions  . Penicillins     REACTION: rash     Outpatient Prescriptions Prior to Visit  Medication Sig Dispense Refill  . arformoterol (BROVANA) 15 MCG/2ML NEBU Take 2 mLs (15 mcg total) by nebulization 2 (two) times daily.  120 mL  6  . aspirin 81 MG tablet Take 81 mg by mouth every other day.        . budesonide (PULMICORT) 0.25 MG/2ML nebulizer solution Take 2 mLs (0.25 mg total)  by nebulization 2 (two) times daily.  120 mL  6  . carvedilol (COREG) 3.125 MG tablet Take 3.125 mg by mouth 2 (two) times daily with a meal.       . celecoxib (CELEBREX) 100 MG capsule Take 1 capsule (100 mg total) by mouth 2 (two) times daily as needed.  180 capsule  0  . clotrimazole-betamethasone (LOTRISONE) cream Apply topically 2 (two) times daily.  30 g  0  . DILANTIN 100 MG ER capsule TAKE 2 CAPSULES BY MOUTH TWICE DAILY  120 capsule  0  . lisinopril (PRINIVIL,ZESTRIL) 5 MG tablet Take 5 mg by mouth daily.        [name] loratadine (CLARITIN) 10 MG tablet Take 10 mg by mouth daily.        Bertram Gala Glycol-Propyl Glycol (SYSTANE OP) Apply 1 drop to eye 4 (four) times daily.        . polyethylene glycol (MIRALAX / GLYCOLAX) packet Take 17 g by mouth daily.        . simvastatin (ZOCOR) 40 MG tablet 1/2 by mouth once daily.       [name] warfarin (COUMADIN) 5 MG tablet 5 mg. Take 1/2 tablet by mouth once daily      . albuterol-ipratropium (COMBIVENT) 18-103 MCG/ACT inhaler Inhale 2 puffs into the lungs 3 (three) times daily as needed.       [name] ipratropium  (ATROVENT HFA) 17 MCG/ACT inhaler Inhale 1 puff into the lungs 2 (two) times daily.  1 Inhaler  3     Review of Systems  Constitutional:   No  weight loss, night sweats,  Fevers, chills, fatigue, lassitude. HEENT:   No headaches,  Difficulty swallowing,  Tooth/dental problems,  Sore throat,                No sneezing, itching, ear ache, nasal congestion, post nasal drip,   CV:  No chest pain,  Orthopnea, PND, swelling in lower extremities, anasarca, dizziness, palpitations  GI  No heartburn, indigestion, abdominal pain, nausea, vomiting, diarrhea, change in bowel habits, loss of appetite  Resp: Notes  shortness of breath with exertion and  at rest.  No excess mucus, no productive cough,  Notes  non-productive cough,  No coughing up of blood.  No change in color of mucus.  No wheezing.  No chest wall deformity  Skin: no rash or lesions.  GU: no dysuria, change in color of urine, no urgency or frequency.  No flank pain.  MS:  No joint pain or swelling.  No decreased range of motion.  No back pain.  Psych:  No change in mood or affect. No depression or anxiety.  No memory loss.     Objective:   Physical Exam  Filed Vitals:   05/09/11 1141  BP: 120/54  Pulse: 77  Temp: 97.5 F (36.4 C)  TempSrc: Oral  Height: 5\' 7"  (1.702 m)  Weight: 73.936 kg (163 lb)  SpO2: 92%    Gen: Pleasant, well-nourished, in no distress,  normal affect  ENT: No lesions,  mouth clear,  oropharynx clear, no postnasal drip  Neck: No JVD, no TMG, no carotid bruits  Lungs: No use of accessory muscles, no dullness to percussion, distant bs  Cardiovascular: RRR, heart sounds normal, no murmur or gallops, no peripheral edema  Abdomen: soft and NT, no HSM,  BS normal  Musculoskeletal: No deformities, no cyanosis or clubbing  Neuro: alert, non focal  Skin: Warm, no lesions or rashes  Assessment & Plan:   COPD Mild Copd exacerbation with flare Plan Take ceftin one twice daily for 5  days No other medication changes Stay on nebulizer twice daily Use combivent as needed for shortness of breath Stop atrovent Return 3 months [place]      Updated Medication List Outpatient Encounter Prescriptions as of 05/09/2011  Medication Sig Dispense Refill  . albuterol-ipratropium (COMBIVENT) 18-103 MCG/ACT inhaler Inhale 2 puffs into the lungs 3 (three) times daily as needed.  1 Inhaler  3  . arformoterol (BROVANA) 15 MCG/2ML NEBU Take 2 mLs (15 mcg total) by nebulization 2 (two) times daily.  120 mL  6  . aspirin 81 MG tablet Take 81 mg by mouth every other day.        . budesonide (PULMICORT) 0.25 MG/2ML nebulizer solution Take 2 mLs (0.25 mg total) by nebulization 2 (two) times daily.  120 mL  6  . carvedilol (COREG) 3.125 MG tablet Take 3.125 mg by mouth 2 (two) times daily with a meal.       . celecoxib (CELEBREX) 100 MG capsule Take 1 capsule (100 mg total) by mouth 2 (two) times daily as needed.  180 capsule  0  . clotrimazole-betamethasone (LOTRISONE) cream Apply topically 2 (two) times daily.  30 g  0  . DILANTIN 100 MG ER capsule TAKE 2 CAPSULES BY MOUTH TWICE DAILY  120 capsule  0  . lisinopril (PRINIVIL,ZESTRIL) 5 MG tablet Take 5 mg by mouth daily.        [name] loratadine (CLARITIN) 10 MG tablet Take 10 mg by mouth daily.        Bertram Gala Glycol-Propyl Glycol (SYSTANE OP) Apply 1 drop to eye 4 (four) times daily.        . polyethylene glycol (MIRALAX / GLYCOLAX) packet Take 17 g by mouth daily.        . simvastatin (ZOCOR) 40 MG tablet 1/2 by mouth once daily.       [name] warfarin (COUMADIN) 5 MG tablet 5 mg. Take 1/2 tablet by mouth once daily      . DISCONTD: albuterol-ipratropium (COMBIVENT) 18-103 MCG/ACT inhaler Inhale 2 puffs into the lungs 3 (three) times daily as needed.       . cefUROXime (CEFTIN) 250 MG tablet Take 1 tablet (250 mg total) by mouth 2 (two) times daily.  10 tablet  0  . DISCONTD: ipratropium (ATROVENT HFA) 17 MCG/ACT inhaler Inhale 1 puff into  the lungs 2 (two) times daily.  1 Inhaler  3

## 2011-05-10 NOTE — Assessment & Plan Note (Signed)
Mild Copd exacerbation with flare Plan Take ceftin one twice daily for 5 days No other medication changes Stay on nebulizer twice daily Use combivent as needed for shortness of breath Stop atrovent Return 3 months [place]

## 2011-06-01 ENCOUNTER — Telehealth: Payer: Self-pay | Admitting: Internal Medicine

## 2011-06-01 NOTE — Telephone Encounter (Signed)
Add on tomorrow after my 10.30 patient

## 2011-06-01 NOTE — Telephone Encounter (Signed)
Called to add pt on, left voice msg on machine to call back.

## 2011-06-01 NOTE — Telephone Encounter (Signed)
The pt's son called and is requesting the pt be worked in for a follow up on a hernia. I offered him an apt on 06/08/11, however, he refused and is requesting a sooner work in.  Please advise.   The son also had concerns regarding the medication Lisinopril 5mg .  He stated the pt's cardiologist increased the dose to 10mg  and wanted to make sure Dr.Paz was aware of this.   The son also has requested a flu shot at this location.  He is a pt of Dr.Plotnikov and was advised to have a flu shot there, but has refused stating he needs it done at the [facility] location.  Please advise if this is possible to accommodate.    Thank you.

## 2011-06-02 ENCOUNTER — Ambulatory Visit (INDEPENDENT_AMBULATORY_CARE_PROVIDER_SITE_OTHER): Payer: Medicare Other | Admitting: Internal Medicine

## 2011-06-02 VITALS — BP 130/62 | HR 75 | Temp 98.3°F | Ht 67.5 in | Wt 164.0 lb

## 2011-06-02 DIAGNOSIS — K4090 Unilateral inguinal hernia, without obstruction or gangrene, not specified as recurrent: Secondary | ICD-10-CM

## 2011-06-02 DIAGNOSIS — I10 Essential (primary) hypertension: Secondary | ICD-10-CM

## 2011-06-02 MED ORDER — LISINOPRIL 5 MG PO TABS: 5.0000 mg | ORAL_TABLET | Freq: Two times a day (BID) | ORAL | Status: DC

## 2011-06-02 NOTE — Assessment & Plan Note (Addendum)
Reportedly, lisinopril dose was increased weeks ago by cardiology. Prescription provided. I liked to check a BMP but they said that they will do that with cardiology or at [facility], the patient's residency

## 2011-06-02 NOTE — Assessment & Plan Note (Signed)
Long history of a hernia as described above, I don't think he is a surgical candidate however I would like him to meet with one of the surgeons in town to discuss further. Red flag symptoms that should prompt an ER visit discuss with the patient and his son.

## 2011-06-02 NOTE — Progress Notes (Signed)
  Subjective:    Patient ID: Antonio Hester, male    DOB: October 21, 1930, 78 y.o.   MRN: [id number]  HPI Acute visit, here with his son. He has a long history of right inguinal hernia, in the last week has noted that the hernia gets "hard" at night, slightly larger?[name] Also his cardiologist increase lisinopril from 5 q day to 5 mg bid about 4 weeks ago. Needs a prescription.   Past Medical History:  Cardiovascular  CAD  Atrial fibrillation s/p ablation 07-2007  h/o complete heart block and pacemaker dependence , s/p defib change 06-2008  Non-ischemic cardiomyopathy  Coumadin f/u at [name] office  -----------------------------------------  COPD  H/o a LUNG ABSCESS  BPH  SZ d/o (sees [name], last OV 2006)  Hyperlipidemia  Hypertension  GERD-- severe esophagitis per EGD 2007  osteopenia --per DEXA 6/11  osteoarthritis  MDs--Dr Love, [facility] ([name] @ [facility], [name] @ [facility]), pulmonary [name] , urology name?, GI [name]   Past Surgical History:  Pacemaker: (St. Jude)  Defibrillator- 06/2008   Social History:  lives at [facility], has an apartment  children x 2 , son ([place]) and daughter Jeanice Lim)  Former Smoke  ETOH-wine sometimes    Review of Systems No nausea, vomiting. No abdominal pain.     Objective:   Physical Exam  Constitutional: He appears well-developed. No distress.  Abdominal:    Skin: He is not diaphoretic.          Assessment & Plan:

## 2011-06-02 NOTE — Patient Instructions (Signed)
Please be sure a BMP is done at the cardiologist office  due to the recent increase in lisinopril

## 2011-06-02 NOTE — Telephone Encounter (Signed)
Pt came in for OV today.

## 2011-06-03 ENCOUNTER — Encounter: Payer: Self-pay | Admitting: Internal Medicine

## 2011-06-09 ENCOUNTER — Ambulatory Visit (INDEPENDENT_AMBULATORY_CARE_PROVIDER_SITE_OTHER): Payer: Medicare Other | Admitting: Surgery

## 2011-06-16 ENCOUNTER — Telehealth: Payer: Self-pay | Admitting: Internal Medicine

## 2011-06-16 NOTE — Telephone Encounter (Signed)
Advised the patient or the patient's son: I received a BMP from 06/14/2011, it is okay.

## 2011-06-17 NOTE — Telephone Encounter (Signed)
Spoke with patient, patient aware labs ok

## 2011-06-20 ENCOUNTER — Encounter (INDEPENDENT_AMBULATORY_CARE_PROVIDER_SITE_OTHER): Payer: Self-pay | Admitting: Surgery

## 2011-06-21 ENCOUNTER — Ambulatory Visit (INDEPENDENT_AMBULATORY_CARE_PROVIDER_SITE_OTHER): Payer: Medicare Other | Admitting: Surgery

## 2011-07-04 ENCOUNTER — Encounter (INDEPENDENT_AMBULATORY_CARE_PROVIDER_SITE_OTHER): Payer: Self-pay | Admitting: Surgery

## 2011-07-04 ENCOUNTER — Encounter: Payer: Self-pay | Admitting: Vascular Surgery

## 2011-07-04 ENCOUNTER — Ambulatory Visit (INDEPENDENT_AMBULATORY_CARE_PROVIDER_SITE_OTHER): Payer: Medicare Other | Admitting: Surgery

## 2011-07-04 DIAGNOSIS — K4090 Unilateral inguinal hernia, without obstruction or gangrene, not specified as recurrent: Secondary | ICD-10-CM

## 2011-07-04 NOTE — Progress Notes (Signed)
Patient ID: Antonio Hester, male   DOB: 1931/02/27, 78 y.o.   MRN: [id number]  No chief complaint on file.   HPI Antonio Hester is a 78 y.o. male.   HPIPatient is sent at the request of Dr. Drue Novel do 2 right inguinal hernia. I saw him in 2011 for this talked about surgery but he had significant cardiac morbidity and COPD on home oxygen. He has not had any major symptoms from this except for a slight increase in swelling and fullness in his right groin. He is having no severe pain work obstruction.  Past Medical History  Diagnosis Date  . CAD (coronary artery disease)   . Atrial fibrillation     s/p ablation 07/2007  . Heart block     complete, and pacemaker dependence s/p defib change 06/2008  . Cardiomyopathy     non ischemic  . COPD (chronic obstructive pulmonary disease)   . BPH (benign prostatic hyperplasia)   . Seizure     d/o (sees Dr.Love, last Ov 2006)  . Hyperlipemia   . Hypertension   . Abscess of lung   . GERD (gastroesophageal reflux disease)     severe esophagitis per EGD 2007  . Osteopenia     per DEXA 10/2009  . Osteoarthritis   . Carotid artery occlusion     right ICA -occluded/ left ICA 60-79% stenosis    Past Surgical History  Procedure Date  . Pacemaker insertion     st jude  . Cardiac defibrillator placement 06/2008  . Hernia repair 10/05/2009    right inguinal hernia    Family History  Problem Relation Age of Onset  . Heart disease Mother   . Heart disease Father     Social History History  Substance Use Topics  . Smoking status: Former Smoker -- 2.0 packs/day for 50 years    Types: Cigarettes    Quit date: 01/04/1996  . Smokeless tobacco: Never Used  . Alcohol Use: 4.0 oz/week    8 drink(s) per week     wine sometimes    Allergies  Allergen Reactions  . Penicillins     REACTION: rash    Current Outpatient Prescriptions  Medication Sig Dispense Refill  . albuterol-ipratropium (COMBIVENT) 18-103 MCG/ACT inhaler Inhale 2 puffs into the lungs  3 (three) times daily as needed.  1 Inhaler  3  . arformoterol (BROVANA) 15 MCG/2ML NEBU Take 2 mLs (15 mcg total) by nebulization 2 (two) times daily.  120 mL  6  . aspirin 81 MG tablet Take 81 mg by mouth every other day.        . budesonide (PULMICORT) 0.25 MG/2ML nebulizer solution Take 2 mLs (0.25 mg total) by nebulization 2 (two) times daily.  120 mL  6  . carvedilol (COREG) 3.125 MG tablet Take 3.125 mg by mouth 2 (two) times daily with a meal.       . celecoxib (CELEBREX) 100 MG capsule Take 1 capsule (100 mg total) by mouth 2 (two) times daily as needed.  180 capsule  0  . DILANTIN 100 MG ER capsule TAKE 2 CAPSULES BY MOUTH TWICE DAILY  120 capsule  0  . lisinopril (PRINIVIL,ZESTRIL) 5 MG tablet Take 1 tablet (5 mg total) by mouth 2 (two) times daily.  60 tablet  3  . loratadine (CLARITIN) 10 MG tablet Take 10 mg by mouth daily.        Bertram Gala Glycol-Propyl Glycol (SYSTANE OP) Apply 1 drop to eye 4 (  four) times daily.        . polyethylene glycol (MIRALAX / GLYCOLAX) packet Take 17 g by mouth daily.        . simvastatin (ZOCOR) 40 MG tablet 1/2 by mouth once daily.       [name] warfarin (COUMADIN) 5 MG tablet 5 mg. Take 1/2 tablet by mouth once daily        Review of Systems Review of Systems  Constitutional: Negative.   HENT: Negative.   Eyes: Negative.   Respiratory: Positive for shortness of breath and wheezing.   Cardiovascular: Negative for chest pain, palpitations and leg swelling.  Gastrointestinal: Negative.   Genitourinary: Negative.   Neurological: Negative.   Hematological: Negative.   Psychiatric/Behavioral: Negative.     There were no vitals taken for this visit.  Physical Exam Physical Exam  Constitutional: He is oriented to person, place, and time. He appears well-developed and well-nourished.  HENT:  Head: Normocephalic and atraumatic.  Eyes: EOM are normal. Pupils are equal, round, and reactive to light.  Neck: Normal range of motion. Neck supple.    Cardiovascular: Normal rate and regular rhythm.   Pulmonary/Chest: He is in respiratory distress.  Abdominal: Bowel sounds are normal.  Genitourinary:       Reducible right inguinal hernia. No extension into scrotum. No evidence of left inguinal hernia  Musculoskeletal: Normal range of motion.  Neurological: He is alert and oriented to person, place, and time.  Skin: Skin is warm and dry.  Psychiatric: He has a normal mood and affect. His behavior is normal. Judgment and thought content normal.    Data Reviewed   Assessment    Right inguinal hernia reducible    Plan    Given his multiple medical problems, surgical intervention carries high risk. It does not seem any larger than it was in 2011. He is having very few symptoms from this. I feel surgical intervention has a high risk of complication and potential mortality. He is at high risk for recurrence as well. I would continue to watch him at this point in time unless he more pain or obstruction. I explained this to the patient and his son today. They are both in agreement.Recommend a TRUS for treatment.  Follow up prn.       Nai Borromeo A. 07/04/2011, 12:03 PM

## 2011-07-04 NOTE — Patient Instructions (Signed)

## 2011-07-05 ENCOUNTER — Encounter: Payer: Self-pay | Admitting: Vascular Surgery

## 2011-07-05 ENCOUNTER — Other Ambulatory Visit (INDEPENDENT_AMBULATORY_CARE_PROVIDER_SITE_OTHER): Payer: Medicare Other | Admitting: *Deleted

## 2011-07-05 ENCOUNTER — Ambulatory Visit (INDEPENDENT_AMBULATORY_CARE_PROVIDER_SITE_OTHER): Payer: Medicare Other | Admitting: Vascular Surgery

## 2011-07-05 VITALS — BP 152/80 | HR 80 | Resp 16 | Ht 70.0 in | Wt 163.3 lb

## 2011-07-05 DIAGNOSIS — I6529 Occlusion and stenosis of unspecified carotid artery: Secondary | ICD-10-CM

## 2011-07-05 DIAGNOSIS — Z48812 Encounter for surgical aftercare following surgery on the circulatory system: Secondary | ICD-10-CM

## 2011-07-05 NOTE — Progress Notes (Signed)
The patient presents today for followup of his known extracranial cerebrovascular occlusive disease. I had seen him initially 6 months ago. At that time he had an episode of vertigo and blurred vision. Carotid duplex at that time an outlying facility and suggested right internal carotid occlusion and left internal carotid 60-79% stenosis. He is here today for 6 month followup. He does have multiple medical problems including oxygen dependency: Difficulty with walking with a rolling walker and urinary frequency. He says he denies any new neurologic deficits.  Past Medical History  Diagnosis Date  . CAD (coronary artery disease)   . Atrial fibrillation     s/p ablation 07/2007  . Heart block     complete, and pacemaker dependence s/p defib change 06/2008  . Cardiomyopathy     non ischemic  . COPD (chronic obstructive pulmonary disease)   . BPH (benign prostatic hyperplasia)   . Seizure     d/o (sees Dr.Love, last Ov 2006)  . Hyperlipemia   . Hypertension   . Abscess of lung   . GERD (gastroesophageal reflux disease)     severe esophagitis per EGD 2007  . Osteopenia     per DEXA 10/2009  . Osteoarthritis   . Carotid artery occlusion     right ICA -occluded/ left ICA 60-79% stenosis  . Inguinal hernia     History  Substance Use Topics  . Smoking status: Former Smoker -- 2.0 packs/day for 50 years    Types: Cigarettes    Quit date: 01/04/1996  . Smokeless tobacco: Never Used  . Alcohol Use: 4.0 oz/week    8 drink(s) per week     wine sometimes    Family History  Problem Relation Age of Onset  . Heart disease Mother   . Heart disease Father     Allergies  Allergen Reactions  . Penicillins     REACTION: rash    Current outpatient prescriptions:albuterol-ipratropium (COMBIVENT) 18-103 MCG/ACT inhaler, Inhale 2 puffs into the lungs 3 (three) times daily as needed., Disp: 1 Inhaler, Rfl: 3;  arformoterol (BROVANA) 15 MCG/2ML NEBU, Take 2 mLs (15 mcg total) by nebulization 2  (two) times daily., Disp: 120 mL, Rfl: 6;  aspirin 81 MG tablet, Take 81 mg by mouth every other day.  , Disp: , Rfl:  budesonide (PULMICORT) 0.25 MG/2ML nebulizer solution, Take 2 mLs (0.25 mg total) by nebulization 2 (two) times daily., Disp: 120 mL, Rfl: 6;  carvedilol (COREG) 3.125 MG tablet, Take 3.125 mg by mouth 2 (two) times daily with a meal. , Disp: , Rfl: ;  celecoxib (CELEBREX) 100 MG capsule, Take 1 capsule (100 mg total) by mouth 2 (two) times daily as needed., Disp: 180 capsule, Rfl: 0 DILANTIN 100 MG ER capsule, TAKE 2 CAPSULES BY MOUTH TWICE DAILY, Disp: 120 capsule, Rfl: 0;  lisinopril (PRINIVIL,ZESTRIL) 5 MG tablet, Take 1 tablet (5 mg total) by mouth 2 (two) times daily., Disp: 60 tablet, Rfl: 3;  loratadine (CLARITIN) 10 MG tablet, Take 10 mg by mouth daily.  , Disp: , Rfl: ;  Polyethyl Glycol-Propyl Glycol (SYSTANE OP), Apply 1 drop to eye 4 (four) times daily.  , Disp: , Rfl:  polyethylene glycol (MIRALAX / GLYCOLAX) packet, Take 17 g by mouth daily.  , Disp: , Rfl: ;  simvastatin (ZOCOR) 40 MG tablet, 1/2 by mouth once daily. , Disp: , Rfl: ;  warfarin (COUMADIN) 5 MG tablet, 5 mg. Take 1/2 tablet by mouth Monday-Wednesday-Friday.  Takes 1 tablet (5 mg) Tuesday-Thursday-Saturday-Sunday.,  Disp: , Rfl:   BP 152/80  Pulse 80  Resp 16  Ht 5\' 10"  (1.778 m)  Wt 163 lb 4.8 oz (74.072 kg)  BMI 23.43 kg/m2  Body mass index is 23.43 kg/(m^2).       Review of systems no change  Physical exam: Frail appearing white male in no acute distress. Carotid arteries without bruits bilaterally. He has 2+ radial pulses bilaterally. Heart is regular rate and rhythm. He is grossly intact neurologically.  Vascular lab: Carotid duplex reveals occlusion of right internal carotid artery. Loss of these suggest 60-79% stenosis in the left internal carotid artery. He has extreme tortuosity and a contralateral occlusion.  Impression and plan: Symptomatic left internal carotid stenosis with right  carotid artery occlusion. Discuss this at length with the patient and family and they will notify should he develop any new neurologic deficits. I also explained that the tortuosity and contralateral occlusion are probably elevating his velocities. The duplex images suggest less than amount of stenosis suggested by the velocities. We'll see him again in one year with repeat duplex followup

## 2011-07-13 NOTE — Procedures (Unsigned)
CAROTID DUPLEX EXAM  INDICATION:  Followup carotid artery disease  HISTORY: Diabetes: Cardiac:  Atrial fibrillation, heart block, pacemaker/defibrillator 06/2008 Hypertension:  Yes Smoking:  Previously Previous Surgery: CV History:  CVA 10-15 years ago, known right ICA occlusion, left 60%- 79% ICA stenosis Amaurosis Fugax No, Paresthesias Yes No, Hemiparesis No                                      RIGHT             LEFT Brachial systolic pressure:         140               140 Brachial Doppler waveforms:         Triphasic         Triphasic Vertebral direction of flow:        Antegrade         Antegrade DUPLEX VELOCITIES (cm/sec) CCA peak systolic                   39                115 ECA peak systolic                   134               131 ICA peak systolic                   Occluded          180 (mid) ICA end diastolic                   Occluded          63 PLAQUE MORPHOLOGY:                  Heterogeneous     Heterogeneous PLAQUE AMOUNT:                      Occluded          Moderate PLAQUE LOCATION:                    ICA               ICA  IMPRESSION: 1. Known right internal carotid artery occlusion. 2. Although velocities are in the 60%-79% range, internal carotid     artery is very tortuous. 3. Antegrade vertebral artery flow bilaterally.  ___________________________________________ Larina Earthly, M.D.  SS/MEDQ  D:  07/05/2011  T:  07/05/2011  Job:  [id number]

## 2011-07-21 ENCOUNTER — Ambulatory Visit (HOSPITAL_BASED_OUTPATIENT_CLINIC_OR_DEPARTMENT_OTHER)
Admission: RE | Admit: 2011-07-21 | Discharge: 2011-07-21 | Disposition: A | Discharge status: Home / Self Care | Payer: Medicare Other | Source: Ambulatory Visit | Attending: Critical Care Medicine | Admitting: Critical Care Medicine

## 2011-07-21 ENCOUNTER — Encounter: Payer: Self-pay | Admitting: Critical Care Medicine

## 2011-07-21 ENCOUNTER — Ambulatory Visit (INDEPENDENT_AMBULATORY_CARE_PROVIDER_SITE_OTHER): Payer: Medicare Other | Admitting: Critical Care Medicine

## 2011-07-21 VITALS — BP 192/96 | HR 75 | Temp 97.7°F | Ht 70.0 in | Wt 169.0 lb

## 2011-07-21 DIAGNOSIS — R05 Cough: Secondary | ICD-10-CM

## 2011-07-21 DIAGNOSIS — M25559 Pain in unspecified hip: Secondary | ICD-10-CM

## 2011-07-21 DIAGNOSIS — I517 Cardiomegaly: Secondary | ICD-10-CM | POA: Insufficient documentation

## 2011-07-21 DIAGNOSIS — Z9181 History of falling: Secondary | ICD-10-CM | POA: Insufficient documentation

## 2011-07-21 DIAGNOSIS — J209 Acute bronchitis, unspecified: Secondary | ICD-10-CM

## 2011-07-21 DIAGNOSIS — R059 Cough, unspecified: Secondary | ICD-10-CM | POA: Insufficient documentation

## 2011-07-21 DIAGNOSIS — J449 Chronic obstructive pulmonary disease, unspecified: Secondary | ICD-10-CM

## 2011-07-21 DIAGNOSIS — M899 Disorder of bone, unspecified: Secondary | ICD-10-CM | POA: Insufficient documentation

## 2011-07-21 DIAGNOSIS — W19XXXA Unspecified fall, initial encounter: Secondary | ICD-10-CM

## 2011-07-21 DIAGNOSIS — Z87891 Personal history of nicotine dependence: Secondary | ICD-10-CM | POA: Insufficient documentation

## 2011-07-21 DIAGNOSIS — J4489 Other specified chronic obstructive pulmonary disease: Secondary | ICD-10-CM

## 2011-07-21 MED ORDER — FLUTICASONE PROPIONATE 50 MCG/ACT NA SUSP: 2.0000 | Freq: Every day | NASAL | Status: DC

## 2011-07-21 MED ORDER — CEFUROXIME AXETIL 500 MG PO TABS: 500.0000 mg | ORAL_TABLET | Freq: Two times a day (BID) | ORAL | Status: AC

## 2011-07-21 NOTE — Progress Notes (Signed)
Subjective:    Patient ID: Antonio Hester, male    DOB: [birth year], 78 y.o.   MRN: [id number]  HPI  78 y.o.WM copd 5/12 Pt sent to rehab after adm 4/20- 4/25.  Since d/c dyspnea is better.  Pt notes less pain with T12 fx Notes a lot of instability in walking and gait and unable to get up and down.  Now on more oxygen. No real mucus, but does cough more.  Notes some DOE.  No qhs dyspnea.    12/17 At last ov we rec: Stay on oxygen at 3 liters [facility] , will order through [employer] one twice daily in nebulizer No change in atrovent or pulmicort Since last ov back and bladder issues.  Went rehab 4/12- 6/12.  Hosp for a week then went to [facility].  Now at [facility] in independent living area.  Spouse with dementia.   Now notes more cough and mucus for one month.  Mucus now is not coming up.  Cough paroxysm for .  No chest pain. No edema in feet.   Notes dyspnea with exertion and rest.  Has a R groin hernia. No real nasal congestion, just a chronic pn drip.    2/28 Lost balance and fell. R hip is sore.  Hit corner of table.  Copd f/u : dyspnea has been well.  Uses oxygen 3Liters.  No neb med and helps. Notes LE edema CAT 22 . Notes cough and dry throat is worse   Past Medical History  Diagnosis Date  . CAD (coronary artery disease)   . Atrial fibrillation     s/p ablation 07/2007  . Heart block     complete, and pacemaker dependence s/p defib change 06/2008  . Cardiomyopathy     non ischemic  . COPD (chronic obstructive pulmonary disease)   . BPH (benign prostatic hyperplasia)   . Seizure     d/o (sees Dr.Love, last Ov 2006)  . Hyperlipemia   . Hypertension   . Abscess of lung   . GERD (gastroesophageal reflux disease)     severe esophagitis per EGD 2007  . Osteopenia     per DEXA 10/2009  . Osteoarthritis   . Carotid artery occlusion     right ICA -occluded/ left ICA 60-79% stenosis  . Inguinal hernia      Family History  Problem Relation Age of Onset  .  Heart disease Mother   . Heart disease Father      History   Social History  . Marital Status: Married    Spouse Name: N/A    Number of Children: 2  . Years of Education: N/A   Occupational History  . Not on file.   Social History Main Topics  . Smoking status: Former Smoker -- 2.0 packs/day for 50 years    Types: Cigarettes    Quit date: 01/04/1996  . Smokeless tobacco: Never Used  . Alcohol Use: 4.0 oz/week    8 drink(s) per week     wine sometimes  . Drug Use: Not on file  . Sexually Active: Not on file   Other Topics Concern  . Not on file   Social History Narrative   Lives at [facility], has an apartment, lives w/ wife, has a dog     Allergies  Allergen Reactions  . Penicillins     REACTION: rash     Outpatient Prescriptions Prior to Visit  Medication Sig Dispense Refill  . albuterol-ipratropium (COMBIVENT) 18-103  MCG/ACT inhaler Inhale 2 puffs into the lungs 3 (three) times daily as needed.  1 Inhaler  3  . arformoterol (BROVANA) 15 MCG/2ML NEBU Take 2 mLs (15 mcg total) by nebulization 2 (two) times daily.  120 mL  6  . aspirin 81 MG tablet Take 81 mg by mouth every other day.        . budesonide (PULMICORT) 0.25 MG/2ML nebulizer solution Take 2 mLs (0.25 mg total) by nebulization 2 (two) times daily.  120 mL  6  . carvedilol (COREG) 3.125 MG tablet Take 3.125 mg by mouth 2 (two) times daily with a meal.       . celecoxib (CELEBREX) 100 MG capsule Take 1 capsule (100 mg total) by mouth 2 (two) times daily as needed.  180 capsule  0  . DILANTIN 100 MG ER capsule TAKE 2 CAPSULES BY MOUTH TWICE DAILY  120 capsule  0  . lisinopril (PRINIVIL,ZESTRIL) 5 MG tablet Take 1 tablet (5 mg total) by mouth 2 (two) times daily.  60 tablet  3  . loratadine (CLARITIN) 10 MG tablet Take 10 mg by mouth daily.        Bertram Gala Glycol-Propyl Glycol (SYSTANE OP) Apply 1 drop to eye 4 (four) times daily.        . polyethylene glycol (MIRALAX / GLYCOLAX) packet Take 17 g by  mouth daily.        . simvastatin (ZOCOR) 40 MG tablet 1/2 by mouth once daily.       [name] warfarin (COUMADIN) 5 MG tablet 5 mg. Take 1/2 tablet by mouth Monday-Wednesday-Friday.  Takes 1 tablet (5 mg) Tuesday-Thursday-Saturday-Sunday.         Review of Systems  Constitutional:   No  weight loss, night sweats,  Fevers, chills, fatigue, lassitude. HEENT:   No headaches,  Difficulty swallowing,  Tooth/dental problems,  Sore throat,                No sneezing, itching, ear ache, nasal congestion, post nasal drip,   CV:  No chest pain,  Orthopnea, PND, swelling in lower extremities, anasarca, dizziness, palpitations  GI  No heartburn, indigestion, abdominal pain, nausea, vomiting, diarrhea, change in bowel habits, loss of appetite  Resp: Notes  shortness of breath with exertion and  at rest.  No excess mucus, no productive cough,  Notes  non-productive cough,  No coughing up of blood.  No change in color of mucus.  No wheezing.  No chest wall deformity  Skin: no rash or lesions.  GU: no dysuria, change in color of urine, no urgency or frequency.  No flank pain.  MS:  No joint pain or swelling.  No decreased range of motion.  No back pain.  Psych:  No change in mood or affect. No depression or anxiety.  No memory loss.     Objective:   Physical Exam  Filed Vitals:   07/21/11 1620  BP: 192/96  Pulse: 75  Temp: 97.7 F (36.5 C)  TempSrc: Oral  Height: 5\' 10"  (1.778 m)  Weight: 169 lb (76.658 kg)  SpO2: 95%    Gen: Pleasant, well-nourished, in no distress,  normal affect  ENT: No lesions,  mouth clear,  oropharynx clear, no postnasal drip  Neck: No JVD, no TMG, no carotid bruits  Lungs: No use of accessory muscles, no dullness to percussion, distant bs  Cardiovascular: RRR, heart sounds normal, no murmur or gallops, no peripheral edema  Abdomen: soft and NT, no HSM,  BS normal  Musculoskeletal: No deformities, no cyanosis or clubbing, tender R pelvis area , no  deformitiy  Neuro: alert, non focal  Skin: Warm, no lesions or rashes     Dg Chest 2 View  07/21/2011  *RADIOLOGY REPORT*  Clinical Data: Larey Seat today with right hip pain, cough, former smoking history  CHEST - 2 VIEW  Comparison: Chest x-ray of 09/30/2010  Findings: There is moderate cardiomegaly present.  There does appear to be moderate pulmonary vascular congestion.  No effusion is seen.  Permanent pacemaker is noted with AICD leads.  The bones are osteopenic and there is a partial compression deformity of a lower thoracic vertebral body in the region of T12 and very minimal anterior wedging of T10 vertebral body anteriorly and superiorly. These partial compression deformities do appear to have been present on a lateral chest x-ray from 12/25/2010 however.  IMPRESSION:  1.  Moderate cardiomegaly and probable pulmonary vascular congestion. 2.  Probable old compression deformities of T12 and T10.  Original Report Authenticated By: Juline Patch, M.D.   Dg Hip Complete Right  07/21/2011  *RADIOLOGY REPORT*  Clinical Data: Larey Seat today with right hip pain  RIGHT HIP - COMPLETE 2+ VIEW  Comparison: None.  Findings: No hip fracture is seen.  Both hips are in normal position with mild acetabular spurring superiorly and the pelvic rami are intact.  The SI joints appear normal.  The bones are slightly osteopenic.  IMPRESSION: No acute fracture.  Original Report Authenticated By: Juline Patch, M.D.      Assessment & Plan:   COPD Acute tracheobronchitis with severe chronic obstructive lung disease class D. Plan Flonase two puff each nostril daily Ceftin one twice daily for 7days No other medication changes Return 2 months  At high risk for falls Fall earlier today prior to today's office visit with right hip pain Note x-rays of right hip and pelvis are negative for fracture Will mark in the patient's chart that he is a high fall risk     Updated Medication List Outpatient Encounter  Prescriptions as of 07/21/2011  Medication Sig Dispense Refill  . albuterol-ipratropium (COMBIVENT) 18-103 MCG/ACT inhaler Inhale 2 puffs into the lungs 3 (three) times daily as needed.  1 Inhaler  3  . arformoterol (BROVANA) 15 MCG/2ML NEBU Take 2 mLs (15 mcg total) by nebulization 2 (two) times daily.  120 mL  6  . aspirin 81 MG tablet Take 81 mg by mouth every other day.        . budesonide (PULMICORT) 0.25 MG/2ML nebulizer solution Take 2 mLs (0.25 mg total) by nebulization 2 (two) times daily.  120 mL  6  . carvedilol (COREG) 3.125 MG tablet Take 3.125 mg by mouth 2 (two) times daily with a meal.       . celecoxib (CELEBREX) 100 MG capsule Take 1 capsule (100 mg total) by mouth 2 (two) times daily as needed.  180 capsule  0  . DILANTIN 100 MG ER capsule TAKE 2 CAPSULES BY MOUTH TWICE DAILY  120 capsule  0  . lisinopril (PRINIVIL,ZESTRIL) 5 MG tablet Take 1 tablet (5 mg total) by mouth 2 (two) times daily.  60 tablet  3  . loratadine (CLARITIN) 10 MG tablet Take 10 mg by mouth daily.        Bertram Gala Glycol-Propyl Glycol (SYSTANE OP) Apply 1 drop to eye 4 (four) times daily.        . polyethylene glycol (MIRALAX / GLYCOLAX) packet Take  17 g by mouth daily.        . simvastatin (ZOCOR) 40 MG tablet 1/2 by mouth once daily.       [name] warfarin (COUMADIN) 5 MG tablet 5 mg. Take 1/2 tablet by mouth Monday-Wednesday-Friday.  Takes 1 tablet (5 mg) Tuesday-Thursday-Saturday-Sunday.      . cefUROXime (CEFTIN) 500 MG tablet Take 1 tablet (500 mg total) by mouth 2 (two) times daily.  14 tablet  0  . fluticasone (FLONASE) 50 MCG/ACT nasal spray Place 2 sprays into the nose daily.  16 g  0

## 2011-07-21 NOTE — Patient Instructions (Signed)
Flonase two puff each nostril daily Ceftin one twice daily for 7days No other medication changes Return 2 months

## 2011-07-22 NOTE — Assessment & Plan Note (Signed)
Acute tracheobronchitis with severe chronic obstructive lung disease class D. Plan Flonase two puff each nostril daily Ceftin one twice daily for 7days No other medication changes Return 2 months

## 2011-07-22 NOTE — Assessment & Plan Note (Signed)
Fall earlier today prior to today's office visit with right hip pain Note x-rays of right hip and pelvis are negative for fracture Will mark in the patient's chart that he is a high fall risk

## 2011-07-25 ENCOUNTER — Telehealth: Payer: Self-pay

## 2011-07-25 NOTE — Telephone Encounter (Signed)
[facility] Triage Call Report Triage Record Num: [id number] Operator: Caswell Corwin Patient Name: Antonio Hester Call Date & Time: 07/25/2011 1:33:41PM Patient Phone: [phone] PCP: Patient Gender: Male PCP Fax : Patient DOB: November 10, 1930 Practice Name: [facility] Reason for Call: Caller: Stewart/Child; PCP: Willow Ora; CB#: [phone]; Call regarding Edema; Pt had a fall on his back last week and had a back Xray per Dr. Delford Field. Edema started 07/21/11 to both ankles and now this AM his eye lids are swollen. Triaged Edema/Autraumatic and needs to be seen in 4 hrs. Called ofc for appt and spoke with Renee. Protocol(s) Used: Edema, Atraumatic Recommended Outcome per Protocol: See Provider within 4 hours Reason for Outcome: New or worsening edema and recent unexplained illness Care Advice: ~ **PATIENT WITH PENDING APPOINTMENT TOMORROW**

## 2011-07-25 NOTE — Telephone Encounter (Signed)
Unfortunately I did not have an opening today but advised CAN refer the patient to the ER if necessary otherwise will see him in the morning

## 2011-07-25 NOTE — Telephone Encounter (Signed)
Pt has appt at 11am tomorrow. Pt was also instructed that if symptoms got worse to go to ER.

## 2011-07-26 ENCOUNTER — Ambulatory Visit (INDEPENDENT_AMBULATORY_CARE_PROVIDER_SITE_OTHER): Payer: Medicare Other | Admitting: Internal Medicine

## 2011-07-26 VITALS — BP 138/86 | HR 112 | Temp 97.8°F | Wt 167.0 lb

## 2011-07-26 DIAGNOSIS — I509 Heart failure, unspecified: Secondary | ICD-10-CM

## 2011-07-26 NOTE — Assessment & Plan Note (Addendum)
History of CHF, presents here with increased edema. Compare to last office visit he has gained 3 pounds. Recent chest x-ray 07/21/2011 shows pulmonary vascular congestion. Exam shows question of JVD, edema below the knees. He also has edema at the eyelids, he is on ACEi however I doubt that represents angiodema as the swelling is very soft and pitting Plan: Discontinue Celebrex, it may be playing a role on fluid retention Call cards, I like to start lasix  Check a CMP and prealbumin. Adendum, 07-27-11 at 12.30 pm: i discussed the case w/ cards, at some point he was on lasix 40, he agreed w/ restart lasix2- to 40., Rx sent today  His pulse was elevated yesterday for the first time in his or in this office, will call pt and get a EKG to document rate; labs pending  JP Addendum:07-27-11  5.58 pm EKG at baseline, labs still pending, start lasix, faxed EKG to cards  JP

## 2011-07-26 NOTE — Progress Notes (Signed)
  Subjective:    Patient ID: Antonio Hester, male    DOB: July 29, 1930, 78 y.o.   MRN: [id number]  HPI Acute visit Chief complaint today is increased lower extremity edema for the last 4-5 days, most definitely in the last 2 days. Also both eye lids  are swollen for the last 2 days, worse in the morning, decrease as the day goes by. He is also concerned about the right hip pain, he fell 07/21/2011, x-rays were negative.  Past Medical History:  Cardiovascular  CAD  Atrial fibrillation s/p ablation 07-2007  h/o complete heart block and pacemaker dependence , s/p defib change 06-2008  Non-ischemic cardiomyopathy  Coumadin f/u at [name] office  -----------------------------------------  COPD  H/o a LUNG ABSCESS  BPH  SZ d/o (sees [name], last OV 2006)  Hyperlipidemia  Hypertension  GERD-- severe esophagitis per EGD 2007  osteopenia --per DEXA 6/11  osteoarthritis  MDs--Dr Love, cardilogy ([name] @ [facility], [name] @ [facility]), pulmonary [name] , urology name?, GI [name]  Past Surgical History:  Pacemaker: (St. Jude)  Defibrillator- 06/2008    Review of Systems Good medication compliance, he is taking Celebrex more often in the last few days due to hip pain. Denies any increase in sodium intake Denies lip or tongue swelling. Vision is at baseline, very mild itchiness in the eyes if any. No redness. He was recently diagnosed with bronchitis, he is doing some better. Shortness of breath at baseline, denies palpitations.     Objective:   Physical Exam  Constitutional: He is oriented to person, place, and time. He appears well-developed. No distress.  Eyes:       eue lids and skin under the eyes are  symmetrically swollen,  Welling is soft and somehow pitting. Lips without apparent swelling.   Neck:       Question  of JVD at 47  Cardiovascular:       Seems regular   Pulmonary/Chest:       Decreased BS, otherwise clear  Musculoskeletal:       Pitting edema    symmetric, does not go beyond the knees. No proximal lower extremity edema or abdominal wall edema. Range of motion of the hip is normal. I actually palpated the right hip, no hematoma, swelling or deformity.  Neurological: He is alert and oriented to person, place, and time.  Skin: He is not diaphoretic.          Assessment & Plan:  Chart is reviewed, saw vascular surgery, recommend observation in reference to the carotid artery disease. Also so Gen. surgery, recommend observation in reference to the inguinal hernia ----------------------  I spent more than 45  min with the patient, >50% of the time counseling,   reviewing the chart and coordinating his care with other providers

## 2011-07-26 NOTE — Patient Instructions (Signed)
Stop Celebrex for now, take Tylenol for pain. Watch your salt intake, elevate your legs during the daytime. If the swelling in the eyelids gets worse or you have any swelling in the lips or tongue---> go to the ER Come back in one month.

## 2011-07-27 ENCOUNTER — Encounter: Payer: Self-pay | Admitting: Internal Medicine

## 2011-07-27 ENCOUNTER — Ambulatory Visit (INDEPENDENT_AMBULATORY_CARE_PROVIDER_SITE_OTHER): Payer: Medicare Other | Admitting: Internal Medicine

## 2011-07-27 ENCOUNTER — Telehealth: Payer: Self-pay | Admitting: Internal Medicine

## 2011-07-27 DIAGNOSIS — R000 Tachycardia, unspecified: Secondary | ICD-10-CM

## 2011-07-27 MED ORDER — FUROSEMIDE 20 MG PO TABS: 20.0000 mg | ORAL_TABLET | Freq: Every day | ORAL | Status: DC

## 2011-07-27 NOTE — Telephone Encounter (Signed)
Please call stewart @ [phone]

## 2011-07-27 NOTE — Telephone Encounter (Signed)
Spoke with stewart.

## 2011-07-28 ENCOUNTER — Telehealth: Payer: Self-pay | Admitting: Internal Medicine

## 2011-07-28 NOTE — Telephone Encounter (Signed)
Referral was placed 

## 2011-07-28 NOTE — Telephone Encounter (Signed)
Patient's son called stating pt needs a referral to [facility] (Dr. Mauricio Po) for new hearing aids. His appt is already scheduled for 08-17-11, but they need the referral for it to be approved by Medicare.

## 2011-08-02 ENCOUNTER — Telehealth: Payer: Self-pay

## 2011-08-02 MED ORDER — FUROSEMIDE 20 MG PO TABS: 20.0000 mg | ORAL_TABLET | Freq: Every day | ORAL | Status: DC

## 2011-08-02 NOTE — Telephone Encounter (Signed)
The prescription for Lasix was sent to the wrong pharmacy.  I have resent it to [facility].

## 2011-08-03 NOTE — Telephone Encounter (Signed)
Referral faxed to [facility] office.

## 2011-08-04 ENCOUNTER — Telehealth: Payer: Self-pay | Admitting: Internal Medicine

## 2011-08-04 DIAGNOSIS — I509 Heart failure, unspecified: Secondary | ICD-10-CM

## 2011-08-04 NOTE — Telephone Encounter (Signed)
Advise patient: I never saw the labs ordered 07-26-10, recommend to get labs   CMP, prealbumin -- dx CHF

## 2011-08-05 NOTE — Telephone Encounter (Signed)
Spoke with pt & set up lab appt.

## 2011-08-08 ENCOUNTER — Other Ambulatory Visit (INDEPENDENT_AMBULATORY_CARE_PROVIDER_SITE_OTHER): Payer: Medicare Other

## 2011-08-08 DIAGNOSIS — I509 Heart failure, unspecified: Secondary | ICD-10-CM

## 2011-08-08 LAB — COMPREHENSIVE METABOLIC PANEL
ALT: 12 U/L (ref 0–53)
CO2: 28 mEq/L (ref 19–32)
Calcium: 8.8 mg/dL (ref 8.4–10.5)
Chloride: 99 mEq/L (ref 96–112)
GFR: 74.84 mL/min (ref >60.00)
Glucose, Bld: 101 mg/dL — ABNORMAL HIGH (ref 70–99)
Sodium: 136 mEq/L (ref 135–145)
Total Protein: 6.6 g/dL (ref 6.0–8.3)

## 2011-08-08 NOTE — Progress Notes (Signed)
  Subjective:    Patient ID: Antonio Hester, male    DOB: [birth year], 78 y.o.   MRN: [id number]  HPI Here for EKG   Review of Systems     Objective:   Physical Exam        Assessment & Plan:

## 2011-08-09 ENCOUNTER — Telehealth: Payer: Self-pay | Admitting: Internal Medicine

## 2011-08-09 NOTE — Telephone Encounter (Signed)
appt made for 11:15am tomorrow 08/10/11 with Dr Paz--SS   [facility] Triage Call Report Triage Record Num: [id number] Operator: Durward Mallard DiMatteis Patient Name: Antonio Hester Call Date & Time: 08/09/2011 12:30:18PM Patient Phone: [phone] PCP: Nolon Rod. Paz Patient Gender: Male PCP Fax : Patient DOB: 10-13-1930 Practice Name[facility] Reason for Call: Caller: Stewart/Child; PCP: Willow Ora; CB#: [phone]; Call regarding Swelling around eyes and eyelids and ankles; was last seen in the office approx 2 weeks ago and was ordered Lasix for swelling; took a little while to start he Lasix; has been on since 08/04/11; swelling is still present around the eyes and ankles; the swelling is no change from starting the Lasix; denies diff breathing; no fever; Triaged per Edema, Atraumatic Guideline; See in 24 hr d/t edema newly worse than usual pattern; will comply; appt made for 11:15am tomorrow 08/10/11 with Dr Drue Novel; will comply; call back if sx worsen; will comply Protocol(s) Used: Edema, Atraumatic Recommended Outcome per Protocol: See Provider within 24 hours Reason for Outcome: Edema newly worse than usual pattern OR newly developed in last 12 hours Care Advice: ~ Call provider if symptoms worsen or new symptoms develop. ~ Rest in a reclining chair or elevate head and chest on 2 or 3 pillows when lying down to make breathing easier. LEG CARE: - Avoid prolonged sitting or standing; take a break to move around every hour or so. - Keep legs raised when sitting, resting or sleeping; when possible raise legs above level of the heart for 20 -30 minutes. - Do not cross your legs. - Wear loose, non-restrictive clothing, especially around waist, groin area and legs. - Consider using support hose if recommended by your provider. ~

## 2011-08-09 NOTE — Telephone Encounter (Signed)
noted 

## 2011-08-10 ENCOUNTER — Ambulatory Visit (INDEPENDENT_AMBULATORY_CARE_PROVIDER_SITE_OTHER): Payer: Medicare Other | Admitting: Internal Medicine

## 2011-08-10 VITALS — BP 138/84 | HR 75 | Temp 97.7°F | Wt 163.0 lb

## 2011-08-10 DIAGNOSIS — I509 Heart failure, unspecified: Secondary | ICD-10-CM

## 2011-08-10 NOTE — Assessment & Plan Note (Signed)
Patient is back on Lasix, we are going to keep him on 20 mg daily. As far as the edema, it is improving, his weight dropped from 167 to 163 pounds in 2 weeks. CMP done few days ago showed normal electrolytes and albumin. Unclear why he is swelling in the eyelids, recommend to discuss with his eye and heart  Doctor; to call me if sx incrrease

## 2011-08-10 NOTE — Patient Instructions (Signed)
Continue the same meds Lasix 20 mg is one tab a day Take celebrex only sporadically , 1 or 2 times a week. Use Tylenol for pain instead

## 2011-08-10 NOTE — Progress Notes (Signed)
  Subjective:    Patient ID: Antonio Hester, male    DOB: 1930/11/25, 78 y.o.   MRN: [id number]  HPI Followup from previous visit. He is here with Lanora Manis who reports helps him regularly.  At the time of her last visit, he complained of increased swelling in the legs and in the eyelids. He was recommended Lasix, has been taking 40 mg daily. Overall, the edema has decreased. The LE edma is not completely gone, the  eyelids edema is better although the L side was swolen yesterday  Past Medical History:   Cardiovascular  CAD   Atrial fibrillation s/p ablation 07-2007   h/o complete heart block and pacemaker dependence , s/p defib change 06-2008   Non-ischemic cardiomyopathy   Coumadin f/u at [name] office   -----------------------------------------   COPD   H/o a LUNG ABSCESS   BPH   SZ d/o (sees [name], last OV 2006)   Hyperlipidemia   Hypertension   GERD-- severe esophagitis per EGD 2007   osteopenia --per DEXA 6/11   osteoarthritis   MDs--Dr Love, [facility] ([name] @ [facility], [name] @ [facility]), pulmonary [name] , urology name?, GI [name]   Past Surgical History:   Pacemaker: (St. Jude)   Defibrillator- 06/2008     Review of Systems No chest pain or shortness or breath is at baseline . No orthopnea. Denies any discharge from the eyes, eyes are slt itchy. No lip swelling Still takes celebrex     Objective:   Physical Exam A, Ox3 Face: eye lids (upper) slt swollen, less compared to last visit Lungs-- decreased BS, no distress  LE-- trace edema, slt worse on the L ankle, calves are symmetric       Assessment & Plan:

## 2011-08-11 ENCOUNTER — Encounter: Payer: Self-pay | Admitting: Internal Medicine

## 2011-08-29 ENCOUNTER — Ambulatory Visit: Payer: Medicare Other | Admitting: Internal Medicine

## 2011-09-09 ENCOUNTER — Other Ambulatory Visit: Payer: Self-pay | Admitting: Internal Medicine

## 2011-09-12 ENCOUNTER — Encounter: Payer: Self-pay | Admitting: Critical Care Medicine

## 2011-09-12 ENCOUNTER — Ambulatory Visit (INDEPENDENT_AMBULATORY_CARE_PROVIDER_SITE_OTHER): Payer: Medicare Other | Admitting: Critical Care Medicine

## 2011-09-12 ENCOUNTER — Ambulatory Visit (HOSPITAL_BASED_OUTPATIENT_CLINIC_OR_DEPARTMENT_OTHER)
Admission: RE | Admit: 2011-09-12 | Discharge: 2011-09-12 | Disposition: A | Discharge status: Home / Self Care | Payer: Medicare Other | Source: Ambulatory Visit | Attending: Critical Care Medicine | Admitting: Critical Care Medicine

## 2011-09-12 VITALS — BP 150/86 | HR 74 | Temp 97.5°F | Ht 70.0 in | Wt 164.0 lb

## 2011-09-12 DIAGNOSIS — J441 Chronic obstructive pulmonary disease with (acute) exacerbation: Secondary | ICD-10-CM

## 2011-09-12 DIAGNOSIS — J309 Allergic rhinitis, unspecified: Secondary | ICD-10-CM

## 2011-09-12 DIAGNOSIS — M519 Unspecified thoracic, thoracolumbar and lumbosacral intervertebral disc disorder: Secondary | ICD-10-CM

## 2011-09-12 DIAGNOSIS — J449 Chronic obstructive pulmonary disease, unspecified: Secondary | ICD-10-CM

## 2011-09-12 MED ORDER — FLUTICASONE PROPIONATE 50 MCG/ACT NA SUSP: 2.0000 | Freq: Every day | NASAL | Status: DC

## 2011-09-12 MED ORDER — PREDNISONE 10 MG PO TABS: ORAL_TABLET | ORAL | Status: DC

## 2011-09-12 NOTE — Assessment & Plan Note (Signed)
Copd without flare but severe allergic rhinitis with postnasal drip Plan pred pulse Resume flonase Note CXR stable 09/12/2011

## 2011-09-12 NOTE — Progress Notes (Signed)
Quick Note:  Notify the patient that the Xray is stable and no heart failure  No change in medications are recommended. Continue current meds as prescribed at last office visit ______

## 2011-09-12 NOTE — Patient Instructions (Signed)
Resume Flonase two puff each nostril daily Take prednisone 10mg  Take 4 for two days three for two days two for two days one for two days Both of above meds sent to pharmacy downstairs Stay on all other medications as prescribed Obtain a chest xray today Return 2 months

## 2011-09-12 NOTE — Progress Notes (Signed)
Subjective:    Patient ID: Antonio Hester, male    DOB: [birth year], 78 y.o.   MRN: [id number]  HPI  78 y.o.WM copd 5/12 Pt sent to rehab after adm 4/20- 4/25.  Since d/c dyspnea is better.  Pt notes less pain with T12 fx Notes a lot of instability in walking and gait and unable to get up and down.  Now on more oxygen. No real mucus, but does cough more.  Notes some DOE.  No qhs dyspnea.    12/17 At last ov we rec: Stay on oxygen at 3 liters [facility] , will order through [employer] one twice daily in nebulizer No change in atrovent or pulmicort Since last ov back and bladder issues.  Went rehab 4/12- 6/12.  Hosp for a week then went to [facility].  Now at [facility] in independent living area.  Spouse with dementia.   Now notes more cough and mucus for one month.  Mucus now is not coming up.  Cough paroxysm for .  No chest pain. No edema in feet.   Notes dyspnea with exertion and rest.  Has a R groin hernia. No real nasal congestion, just a chronic pn drip.    2/28 Lost balance and fell. R hip is sore.  Hit corner of table.  Copd f/u : dyspnea has been well.  Uses oxygen 3Liters.  No neb med and helps. Notes LE edema CAT 22 . Notes cough and dry throat is worse   09/12/2011 Still with gurgle in chest and dyspnea.  Notes more edema in feet and eyes are swollen. On lasix.  No real chest pain.  No fever.   Notes some qhs dyspnea.      Past Medical History  Diagnosis Date  . CAD (coronary artery disease)   . Atrial fibrillation     s/p ablation 07/2007  . Heart block     complete, and pacemaker dependence s/p defib change 06/2008  . Cardiomyopathy     non ischemic  . COPD (chronic obstructive pulmonary disease)   . BPH (benign prostatic hyperplasia)   . Seizure     d/o (sees Dr.Love, last Ov 2006)  . Hyperlipemia   . Hypertension   . Abscess of lung   . GERD (gastroesophageal reflux disease)     severe esophagitis per EGD 2007  . Osteopenia     per DEXA 10/2009  .  Osteoarthritis   . Carotid artery occlusion     right ICA -occluded/ left ICA 60-79% stenosis  . Inguinal hernia      Family History  Problem Relation Age of Onset  . Heart disease Mother   . Heart disease Father      History   Social History  . Marital Status: Married    Spouse Name: N/A    Number of Children: 2  . Years of Education: N/A   Occupational History  . Not on file.   Social History Main Topics  . Smoking status: Former Smoker -- 2.0 packs/day for 50 years    Types: Cigarettes    Quit date: 01/04/1996  . Smokeless tobacco: Never Used  . Alcohol Use: 4.0 oz/week    8 drink(s) per week     wine sometimes  . Drug Use: Not on file  . Sexually Active: Not on file   Other Topics Concern  . Not on file   Social History Narrative   Lives at [facility], has an apartment, lives w/ wife,  has a dog     Allergies  Allergen Reactions  . Penicillins     REACTION: rash     Outpatient Prescriptions Prior to Visit  Medication Sig Dispense Refill  . acetaminophen (TYLENOL) 500 MG tablet Take 500 mg by mouth every 6 (six) hours as needed.      [name] albuterol-ipratropium (COMBIVENT) 18-103 MCG/ACT inhaler Inhale 2 puffs into the lungs 3 (three) times daily as needed.  1 Inhaler  3  . arformoterol (BROVANA) 15 MCG/2ML NEBU Take 2 mLs (15 mcg total) by nebulization 2 (two) times daily.  120 mL  6  . aspirin 81 MG tablet Take 81 mg by mouth every other day.        . budesonide (PULMICORT) 0.25 MG/2ML nebulizer solution Take 2 mLs (0.25 mg total) by nebulization 2 (two) times daily.  120 mL  6  . carvedilol (COREG) 3.125 MG tablet Take 3.125 mg by mouth 2 (two) times daily with a meal.       . celecoxib (CELEBREX) 100 MG capsule Take 1 capsule (100 mg total) by mouth 2 (two) times daily as needed.  180 capsule  0  . Cholecalciferol (VITAMIN D PO) Take 1 tablet by mouth daily.      . Cyanocobalamin (VITAMIN B-12 PO) Take 1 tablet by mouth daily.      [name] DILANTIN 100 MG  ER capsule TAKE 2 CAPSULES BY MOUTH TWICE DAILY  120 capsule  6  . furosemide (LASIX) 20 MG tablet Take 1 tablet (20 mg total) by mouth daily.  30 tablet  3  . lisinopril (PRINIVIL,ZESTRIL) 5 MG tablet Take 1 tablet (5 mg total) by mouth 2 (two) times daily.  60 tablet  3  . loratadine (CLARITIN) 10 MG tablet Take 10 mg by mouth daily.        . Multiple Vitamin (MULTIVITAMIN) tablet Take 1 tablet by mouth daily.      Bertram Gala Glycol-Propyl Glycol (SYSTANE OP) Apply 1 drop to eye 4 (four) times daily.        . polyethylene glycol (MIRALAX / GLYCOLAX) packet Take 17 g by mouth daily.        . simvastatin (ZOCOR) 40 MG tablet 1/2 by mouth once daily.       [name] warfarin (COUMADIN) 5 MG tablet 5 mg. Take 1/2 tablet by mouth Monday-Wednesday-Friday.  Takes 1 tablet (5 mg) Tuesday-Thursday-Saturday-Sunday.      . fluticasone (FLONASE) 50 MCG/ACT nasal spray Place 2 sprays into the nose daily.  16 g  0     Review of Systems  Constitutional:   No  weight loss, night sweats,  Fevers, chills, fatigue, lassitude. HEENT:   No headaches,  Difficulty swallowing,  Tooth/dental problems,  Sore throat,                No sneezing, itching, ear ache, nasal congestion, post nasal drip,   CV:  No chest pain,  Orthopnea, PND, swelling in lower extremities, anasarca, dizziness, palpitations  GI  No heartburn, indigestion, abdominal pain, nausea, vomiting, diarrhea, change in bowel habits, loss of appetite  Resp: Notes  shortness of breath with exertion and  at rest.  No excess mucus, no productive cough,  Notes  non-productive cough,  No coughing up of blood.  No change in color of mucus.  No wheezing.  No chest wall deformity  Skin: no rash or lesions.  GU: no dysuria, change in color of urine, no urgency or frequency.  No flank  pain.  MS:  No joint pain or swelling.  No decreased range of motion.  No back pain.  Psych:  No change in mood or affect. No depression or anxiety.  No memory loss.       Objective:   Physical Exam  Filed Vitals:   09/12/11 1212  BP: 150/86  Pulse: 74  Temp: 97.5 F (36.4 C)  TempSrc: Oral  Height: 5\' 10"  (1.778 m)  Weight: 74.39 kg (164 lb)  SpO2: 100%    Gen: Pleasant, well-nourished, in no distress,  normal affect  ENT: No lesions,  mouth clear,  oropharynx clear, no postnasal drip  Neck: No JVD, no TMG, no carotid bruits  Lungs: No use of accessory muscles, no dullness to percussion, distant bs  Cardiovascular: RRR, heart sounds normal, no murmur or gallops, no peripheral edema  Abdomen: soft and NT, no HSM,  BS normal  Musculoskeletal: No deformities, no cyanosis or clubbing, tender R pelvis area , no deformitiy  Neuro: alert, non focal  Skin: Warm, no lesions or rashes     Dg Chest 2 View  09/12/2011  *RADIOLOGY REPORT*  Clinical Data:   COPD exacerbation  CHEST - 2 VIEW  Comparison: 07/21/2011  Findings:  There is a left chest wall AICD with lead in the right atrial appendage and right ventricle.  Heart size is mildly enlarged.  There is mild interstitial edema.  No effusions.  No air space consolidation identified.  There are compression deformities within the thoracic spine.  These appear similar to the previous examination.  IMPRESSION:  1.  Mild interstitial edema, similar to previous exam. 2.  Stable thoracic compression deformities. Per CMS PQRS reporting requirements (PQRS Measure 24): Given the patient's age of greater than 50 and the fracture site (hip, distal radius, or spine), the patient should be tested for osteoporosis using DXA, and the appropriate treatment considered based on the DXA results.  Original Report Authenticated By: Rosealee Albee, M.D.      Assessment & Plan:   COPD Copd without flare but severe allergic rhinitis with postnasal drip Plan pred pulse Resume flonase Note CXR stable 09/12/2011      Updated Medication List Outpatient Encounter Prescriptions as of 09/12/2011  Medication Sig  Dispense Refill  . acetaminophen (TYLENOL) 500 MG tablet Take 500 mg by mouth every 6 (six) hours as needed.      [name] albuterol-ipratropium (COMBIVENT) 18-103 MCG/ACT inhaler Inhale 2 puffs into the lungs 3 (three) times daily as needed.  1 Inhaler  3  . arformoterol (BROVANA) 15 MCG/2ML NEBU Take 2 mLs (15 mcg total) by nebulization 2 (two) times daily.  120 mL  6  . aspirin 81 MG tablet Take 81 mg by mouth every other day.        . budesonide (PULMICORT) 0.25 MG/2ML nebulizer solution Take 2 mLs (0.25 mg total) by nebulization 2 (two) times daily.  120 mL  6  . carvedilol (COREG) 3.125 MG tablet Take 3.125 mg by mouth 2 (two) times daily with a meal.       . celecoxib (CELEBREX) 100 MG capsule Take 1 capsule (100 mg total) by mouth 2 (two) times daily as needed.  180 capsule  0  . Cholecalciferol (VITAMIN D PO) Take 1 tablet by mouth daily.      . Cyanocobalamin (VITAMIN B-12 PO) Take 1 tablet by mouth daily.      [name] Dextromethorphan-Guaifenesin 20-200 MG/15ML SYRP Take by mouth every morning.      [name]  DILANTIN 100 MG ER capsule TAKE 2 CAPSULES BY MOUTH TWICE DAILY  120 capsule  6  . fluticasone (FLONASE) 50 MCG/ACT nasal spray Place 2 sprays into the nose daily.  16 g  6  . furosemide (LASIX) 20 MG tablet Take 1 tablet (20 mg total) by mouth daily.  30 tablet  3  . lisinopril (PRINIVIL,ZESTRIL) 5 MG tablet Take 1 tablet (5 mg total) by mouth 2 (two) times daily.  60 tablet  3  . loratadine (CLARITIN) 10 MG tablet Take 10 mg by mouth daily.        . Multiple Vitamin (MULTIVITAMIN) tablet Take 1 tablet by mouth daily.      Bertram Gala Glycol-Propyl Glycol (SYSTANE OP) Apply 1 drop to eye 4 (four) times daily.        . polyethylene glycol (MIRALAX / GLYCOLAX) packet Take 17 g by mouth daily.        . simvastatin (ZOCOR) 40 MG tablet 1/2 by mouth once daily.       [name] warfarin (COUMADIN) 5 MG tablet 5 mg. Take 1/2 tablet by mouth Monday-Wednesday-Friday.  Takes 1 tablet (5 mg)  Tuesday-Thursday-Saturday-Sunday.      [name] DISCONTD: fluticasone (FLONASE) 50 MCG/ACT nasal spray Place 2 sprays into the nose daily.  16 g  0  . predniSONE (DELTASONE) 10 MG tablet Take 4 for two days three for two days two for two days one for two days  20 tablet  0

## 2011-09-27 ENCOUNTER — Other Ambulatory Visit: Payer: Self-pay | Admitting: Internal Medicine

## 2011-09-27 NOTE — Telephone Encounter (Signed)
Refill done.  

## 2011-09-28 ENCOUNTER — Telehealth: Payer: Self-pay | Admitting: Critical Care Medicine

## 2011-09-28 NOTE — Telephone Encounter (Signed)
Crystal, if you can please print the OV notes and have PW sign them. I will fax back for you guys. Thanks.

## 2011-09-28 NOTE — Telephone Encounter (Signed)
OV note signed and faxed to the number given

## 2011-10-03 ENCOUNTER — Ambulatory Visit: Payer: Medicare Other | Admitting: Internal Medicine

## 2011-10-19 ENCOUNTER — Encounter: Payer: Self-pay | Admitting: Family

## 2011-10-19 ENCOUNTER — Ambulatory Visit (INDEPENDENT_AMBULATORY_CARE_PROVIDER_SITE_OTHER): Payer: Medicare Other | Admitting: Family

## 2011-10-19 VITALS — BP 124/80 | HR 74 | Temp 97.5°F | Resp 16 | Wt 164.1 lb

## 2011-10-19 DIAGNOSIS — R0609 Other forms of dyspnea: Secondary | ICD-10-CM

## 2011-10-19 DIAGNOSIS — R0600 Dyspnea, unspecified: Secondary | ICD-10-CM

## 2011-10-19 DIAGNOSIS — R0989 Other specified symptoms and signs involving the circulatory and respiratory systems: Secondary | ICD-10-CM

## 2011-10-19 DIAGNOSIS — I509 Heart failure, unspecified: Secondary | ICD-10-CM

## 2011-10-19 DIAGNOSIS — R609 Edema, unspecified: Secondary | ICD-10-CM

## 2011-10-19 LAB — TSH: TSH: 2.073 u[IU]/mL (ref 0.350–4.500)

## 2011-10-19 NOTE — Assessment & Plan Note (Addendum)
Pt with worsening SOB and worsening edema of the LE.  Oxygen sat and weight are stable. Will check pre-albumin (was not sent as ordered last visit).  Will also check TSH and BNP. Increase lasix to 40mg  daily from 20mg  for the next 3 days.  If symptoms improved, then cut back to 20 daily until seen by Dr. Drue Novel in 1 week.  I asked pt to call his cardiologist to see if he can get in to be seen in the next few weeks.

## 2011-10-19 NOTE — Patient Instructions (Addendum)
Increase your lasix to 40mg  (2 tabs) once daily for the next 3 days.  Then, if swelling is improved, may go back to 20mg  once daily. Complete your lab work prior to leaving.  Schedule an earlier follow up with Dr. Luberta Robertson. Follow up with Dr. Drue Novel in 1 week.

## 2011-10-19 NOTE — Progress Notes (Signed)
Subjective:    Patient ID: Antonio Hester, male    DOB: [birth year], 78 y.o.   MRN: [id number]  HPI  Mr.  Havey is a 78 yr old male with multiple medical problems who presents today at the direction of his urologist due to LE edema.  He presents today with Lanora Manis who is his caregiver, and his wife.  Lanora Manis notes that this AM, his eyelids were very swollen, and that they have improved some since he got up.  He restarted lasix 2 weeks ago at 20mg .   He reports increased SOB compared to his basline. He follows with Dr. [name] Cardiology and is not scheduled to follow back up with him for several months.  The patient has a defibrillator.   He follows with Dr. Delford Field for Dr. COPD and is maintained on oxygen via nasal cannula.  Review of Systems    see HPI  Past Medical History  Diagnosis Date  . CAD (coronary artery disease)   . Atrial fibrillation     s/p ablation 07/2007  . Heart block     complete, and pacemaker dependence s/p defib change 06/2008  . Cardiomyopathy     non ischemic  . COPD (chronic obstructive pulmonary disease)   . BPH (benign prostatic hyperplasia)   . Seizure     d/o (sees Dr.Love, last Ov 2006)  . Hyperlipemia   . Hypertension   . Abscess of lung   . GERD (gastroesophageal reflux disease)     severe esophagitis per EGD 2007  . Osteopenia     per DEXA 10/2009  . Osteoarthritis   . Carotid artery occlusion     right ICA -occluded/ left ICA 60-79% stenosis  . Inguinal hernia     History   Social History  . Marital Status: Married    Spouse Name: N/A    Number of Children: 2  . Years of Education: N/A   Occupational History  . Not on file.   Social History Main Topics  . Smoking status: Former Smoker -- 2.0 packs/day for 50 years    Types: Cigarettes    Quit date: 01/04/1996  . Smokeless tobacco: Never Used  . Alcohol Use: 4.0 oz/week    8 drink(s) per week     wine sometimes  . Drug Use: Not on file  . Sexually Active: Not on file     Other Topics Concern  . Not on file   Social History Narrative   Lives at [facility], has an apartment, lives w/ wife, has a dog    Past Surgical History  Procedure Date  . Pacemaker insertion     st jude  . Cardiac defibrillator placement 06/2008  . Hernia repair 10/05/2009    right inguinal hernia    Family History  Problem Relation Age of Onset  . Heart disease Mother   . Heart disease Father     Allergies  Allergen Reactions  . Penicillins     REACTION: rash    Current Outpatient Prescriptions on File Prior to Visit  Medication Sig Dispense Refill  . acetaminophen (TYLENOL) 500 MG tablet Take 500 mg by mouth every 6 (six) hours as needed.      [name] albuterol-ipratropium (COMBIVENT) 18-103 MCG/ACT inhaler Inhale 2 puffs into the lungs 3 (three) times daily as needed.  1 Inhaler  3  . arformoterol (BROVANA) 15 MCG/2ML NEBU Take 2 mLs (15 mcg total) by nebulization 2 (two) times daily.  120 mL  6  . aspirin 81 MG tablet Take 81 mg by mouth every other day.        . budesonide (PULMICORT) 0.25 MG/2ML nebulizer solution Take 2 mLs (0.25 mg total) by nebulization 2 (two) times daily.  120 mL  6  . carvedilol (COREG) 3.125 MG tablet Take 3.125 mg by mouth 2 (two) times daily with a meal.       . celecoxib (CELEBREX) 100 MG capsule Take 1 capsule (100 mg total) by mouth 2 (two) times daily as needed.  180 capsule  0  . Cholecalciferol (VITAMIN D PO) Take 1 tablet by mouth daily.      . Cyanocobalamin (VITAMIN B-12 PO) Take 1 tablet by mouth daily.      [name] Dextromethorphan-Guaifenesin 20-200 MG/15ML SYRP Take by mouth every morning.      [name] DILANTIN 100 MG ER capsule TAKE 2 CAPSULES BY MOUTH TWICE DAILY  120 capsule  6  . fluticasone (FLONASE) 50 MCG/ACT nasal spray Place 2 sprays into the nose daily.  16 g  6  . furosemide (LASIX) 20 MG tablet Take 1 tablet (20 mg total) by mouth daily.  30 tablet  3  . lisinopril (PRINIVIL,ZESTRIL) 5 MG tablet TAKE 1 TABLET BY MOUTH TWICE  DAILY  60 tablet  5  . loratadine (CLARITIN) 10 MG tablet Take 10 mg by mouth daily.        . Multiple Vitamin (MULTIVITAMIN) tablet Take 1 tablet by mouth daily.      Bertram Gala Glycol-Propyl Glycol (SYSTANE OP) Apply 1 drop to eye 4 (four) times daily.        . polyethylene glycol (MIRALAX / GLYCOLAX) packet Take 17 g by mouth daily.        . predniSONE (DELTASONE) 10 MG tablet Take 4 for two days three for two days two for two days one for two days  20 tablet  0  . simvastatin (ZOCOR) 40 MG tablet 1/2 by mouth once daily.       [name] warfarin (COUMADIN) 5 MG tablet 5 mg. Take 1/2 tablet by mouth Monday-Wednesday-Friday.  Takes 1 tablet (5 mg) Tuesday-Thursday-Saturday-Sunday.        BP 124/80  Pulse 74  Temp(Src) 97.5 F (36.4 C) (Oral)  Resp 16  Wt 164 lb 1.3 oz (74.426 kg)  SpO2 99%    Objective:   Physical Exam  Constitutional: He is oriented to person, place, and time. He appears well-developed and well-nourished. No distress.       Elderly white male, NAD.  Wearing oxygen.   Eyes:       Some edema noted of upper and lower eyelids.    Cardiovascular: Normal rate.  An irregular rhythm present.  Pulmonary/Chest: Effort normal and breath sounds normal. No respiratory distress. He has no wheezes. He has no rales. He exhibits no tenderness.  Musculoskeletal:       2 3+ bilateral LE edema.    Neurological: He is alert and oriented to person, place, and time.  Skin: Skin is warm and dry. No rash noted. No erythema. No pallor.  Psychiatric: He has a normal mood and affect. His behavior is normal. Judgment and thought content normal.          Assessment & Plan:

## 2011-10-20 LAB — BRAIN NATRIURETIC PEPTIDE: Brain Natriuretic Peptide: 467.6 pg/mL — ABNORMAL HIGH (ref 0.0–100.0)

## 2011-10-21 ENCOUNTER — Telehealth: Payer: Self-pay | Admitting: Family

## 2011-10-21 NOTE — Telephone Encounter (Signed)
Left detailed message on home# and to call if any questions. 

## 2011-10-21 NOTE — Telephone Encounter (Signed)
Pls call pt and let him know that his blood work does show some extra fluid build up.  He should plan the increased dose of lasix for next 3 days, and follow up with Dr. Drue Novel in 1 week as we discussed at his visit.

## 2011-10-26 ENCOUNTER — Encounter: Payer: Self-pay | Admitting: Internal Medicine

## 2011-10-26 ENCOUNTER — Ambulatory Visit (INDEPENDENT_AMBULATORY_CARE_PROVIDER_SITE_OTHER): Payer: Medicare Other | Admitting: Internal Medicine

## 2011-10-26 VITALS — BP 144/82 | HR 68 | Temp 97.9°F | Wt 164.0 lb

## 2011-10-26 DIAGNOSIS — I509 Heart failure, unspecified: Secondary | ICD-10-CM

## 2011-10-26 NOTE — Progress Notes (Signed)
  Subjective:    Patient ID: Antonio Hester, male    DOB: 1931-01-22, 78 y.o.   MRN: [id number]  HPI Followup He was seen elsewhere a few days ago with increased lower extremity edema, increased shortness of breath and bilateral eyelid swelling. A TSH was normal, BNP was 467 (baseline?), pre-albumine was normal. He temporarily increase Lasix from 20 to 40 mg daily for 3 days. Overall slightly better.  Past Medical History:   Cardiovascular  CAD   Atrial fibrillation s/p ablation 07-2007   h/o complete heart block and pacemaker dependence , s/p defib change 06-2008   Non-ischemic cardiomyopathy   Coumadin f/u at [name] office   -----------------------------------------   COPD   H/o a LUNG ABSCESS   BPH   SZ d/o (sees [name], last OV 2006)   Hyperlipidemia   Hypertension   GERD-- severe esophagitis per EGD 2007   osteopenia --per DEXA 6/11   osteoarthritis   MDs--Dr Love, cardilogy ([name] @ [facility], [name] @ [facility]), pulmonary [name] , urology name?, GI [name]   Past Surgical History:   Pacemaker: (St. Jude)   Defibrillator- 06/2008   Review of Systems Eye lid swelling is better. Lower extremity edema slightly decreased Shortness of breath in general decrease also reports that a lot of times "it depends on the weather" No fever or chills Cough at baseline     Objective:   Physical Exam Alert, oriented x3, no apparent distress. Eyelids, slightly swollen. Lungs: Decreased breath sounds bilaterally CV: Irregularly irregular Extremities 1+ edema, calves symmetric.     Assessment & Plan:

## 2011-10-26 NOTE — Assessment & Plan Note (Signed)
History of CHF, recently seen with lower extremity edema and increased shortness of breath. Temporary increase on Lasix helped. Today he does have some lower extremity edema. Plan: Increase Lasix from 20 daily to 20 mg daily, 40 mg 4 times a week. Followup in 1 month, will need a BMP. Also recommend to see cardiology within the next month. Will call if problems. Recommend to drink fluids to thirst, watch salt intake.

## 2011-11-03 ENCOUNTER — Ambulatory Visit: Payer: Medicare Other | Admitting: Critical Care Medicine

## 2011-11-10 ENCOUNTER — Ambulatory Visit: Payer: Medicare Other | Admitting: Internal Medicine

## 2011-11-15 ENCOUNTER — Telehealth: Payer: Self-pay | Admitting: *Deleted

## 2011-11-15 NOTE — Telephone Encounter (Signed)
thx

## 2011-11-15 NOTE — Telephone Encounter (Signed)
Pt wanted to let you know that with the 40mg  lasix his swelling has come down. He has a follow-up appt scheduled for 7.12.13.

## 2011-11-25 ENCOUNTER — Other Ambulatory Visit: Payer: Self-pay | Admitting: Internal Medicine

## 2011-11-25 NOTE — Telephone Encounter (Signed)
Refill done.  

## 2011-11-30 ENCOUNTER — Ambulatory Visit: Payer: Medicare Other | Admitting: Internal Medicine

## 2011-12-02 ENCOUNTER — Encounter: Payer: Self-pay | Admitting: Internal Medicine

## 2011-12-02 ENCOUNTER — Ambulatory Visit (INDEPENDENT_AMBULATORY_CARE_PROVIDER_SITE_OTHER): Payer: Medicare Other | Admitting: Internal Medicine

## 2011-12-02 ENCOUNTER — Telehealth: Payer: Self-pay | Admitting: Critical Care Medicine

## 2011-12-02 VITALS — BP 128/82 | HR 77 | Temp 97.9°F

## 2011-12-02 DIAGNOSIS — J4489 Other specified chronic obstructive pulmonary disease: Secondary | ICD-10-CM

## 2011-12-02 DIAGNOSIS — R197 Diarrhea, unspecified: Secondary | ICD-10-CM

## 2011-12-02 DIAGNOSIS — N400 Enlarged prostate without lower urinary tract symptoms: Secondary | ICD-10-CM

## 2011-12-02 DIAGNOSIS — I10 Essential (primary) hypertension: Secondary | ICD-10-CM

## 2011-12-02 DIAGNOSIS — H532 Diplopia: Secondary | ICD-10-CM

## 2011-12-02 DIAGNOSIS — J449 Chronic obstructive pulmonary disease, unspecified: Secondary | ICD-10-CM

## 2011-12-02 DIAGNOSIS — I509 Heart failure, unspecified: Secondary | ICD-10-CM

## 2011-12-02 LAB — BASIC METABOLIC PANEL
CO2: 28 mEq/L (ref 19–32)
Chloride: 101 mEq/L (ref 96–112)
Creat: 1.27 mg/dL (ref 0.50–1.35)
Sodium: 139 mEq/L (ref 135–145)

## 2011-12-02 MED ORDER — FLUTICASONE PROPIONATE 50 MCG/ACT NA SUSP: 2.0000 | Freq: Every day | NASAL | Status: DC

## 2011-12-02 MED ORDER — IPRATROPIUM-ALBUTEROL 20-100 MCG/ACT IN AERS: 1.0000 | INHALATION_SPRAY | Freq: Four times a day (QID) | RESPIRATORY_TRACT | Status: DC | PRN

## 2011-12-02 NOTE — Assessment & Plan Note (Signed)
Diarrhea, see history of present illness. Patient believes due to to Lasix, I'm not sure. Plan: Will do stool studies, see instructions.

## 2011-12-02 NOTE — Telephone Encounter (Signed)
Ok to refill and ok to change to respimat which is  combivent 1pf  4 x daily as needed

## 2011-12-02 NOTE — Progress Notes (Signed)
  Subjective:    Patient ID: Antonio Hester, male    DOB: [birth year], 78 y.o.   MRN: [id number]  HPI Routine office visit, here with his son. No new concerns. Continue with diplopia (recently seen by neurology who felt to be stable) Continue with bag under the eyes (recommend observation ) Taking more Lasix than prescribed, see history of present illness. No edema but ankles feel "tight" Has a long history of loose stools on and off more frequent lately.   Past Medical History:   Cardiovascular  CAD   Atrial fibrillation s/p ablation 07-2007   h/o complete heart block and pacemaker dependence , s/p defib change 06-2008   Non-ischemic cardiomyopathy   Coumadin f/u at [name] office   -----------------------------------------   COPD   H/o a LUNG ABSCESS   BPH   SZ d/o Diplopia  Hyperlipidemia   Hypertension   GERD-- severe esophagitis per EGD 2007   osteopenia --per DEXA 6/11   osteoarthritis   MDs-- Neurology in [facility], Cardiology ([name] @ [facility], Dr [facility] @ [facility]), pulmonary [name] , urology , GI [name]    Past Surgical History:   Pacemaker: (St. Jude)   Defibrillator- 06/2008    Review of Systems  no nausea or vomiting. No blood in the stools or abdominal pain. At some point he took MiraLax but he's not taking it any longer. Shortness of breath at baseline, hardly ever has problems with cough.     Objective:   Physical Exam General -- alert, well-developed, fragile 78 year old gentleman Lungs -- decreased breath sounds throughout Heart--irreg   Abdomen--soft, non-tender, no distention Extremities-- no pretibial or ankle edema bilaterally  Neurologic-- alert & oriented X3 Psych-- not anxious appearing and not depressed appearing.       Assessment & Plan:

## 2011-12-02 NOTE — Telephone Encounter (Signed)
RX's have been sent to the pharmacy and pt son is aware. Nothing further was needed

## 2011-12-02 NOTE — Assessment & Plan Note (Signed)
Saw neurology last month, believed to be stable

## 2011-12-02 NOTE — Assessment & Plan Note (Signed)
Reports he finished a treatment with a electrical  bladder stimulator as Rx by urology

## 2011-12-02 NOTE — Assessment & Plan Note (Signed)
Seems stable 

## 2011-12-02 NOTE — Assessment & Plan Note (Addendum)
Was rec  Lasix  20 mg daily except  40 mg 4 times a week but currently taking 40 mg every day. No evidence of volume overload Plan: BMP Go back to our original plan of lasix 20 mg daily and 40 mg 4 times a week

## 2011-12-02 NOTE — Assessment & Plan Note (Signed)
Seems well controlled  

## 2011-12-02 NOTE — Patient Instructions (Addendum)
Sheena, please provide containers for the following studies: Stool culture, stool-WBC, Hemoccult----- DX diarrhea Next office visit in 3 or 4 months, call if the diarrhea gets worse or if you see blood in the stools.

## 2011-12-02 NOTE — Telephone Encounter (Signed)
Pt is requesting refill on combivent and nasonex. combivent is no longer available only combivent respimat. Please advised if okay to change to the respimat Dr. Delford Field, thanks

## 2011-12-07 ENCOUNTER — Encounter: Payer: Self-pay | Admitting: *Deleted

## 2011-12-13 ENCOUNTER — Other Ambulatory Visit: Payer: Self-pay | Admitting: *Deleted

## 2011-12-13 LAB — FECAL LACTOFERRIN, QUANT: Lactoferrin: NEGATIVE

## 2011-12-13 MED ORDER — FUROSEMIDE 20 MG PO TABS: ORAL_TABLET | ORAL | Status: DC

## 2011-12-15 ENCOUNTER — Encounter: Payer: Self-pay | Admitting: *Deleted

## 2011-12-16 LAB — STOOL CULTURE

## 2012-01-18 ENCOUNTER — Ambulatory Visit (INDEPENDENT_AMBULATORY_CARE_PROVIDER_SITE_OTHER): Payer: Medicare Other | Admitting: Internal Medicine

## 2012-01-18 VITALS — BP 142/78 | HR 75 | Temp 97.6°F | Wt 158.0 lb

## 2012-01-18 DIAGNOSIS — H532 Diplopia: Secondary | ICD-10-CM

## 2012-01-18 DIAGNOSIS — R197 Diarrhea, unspecified: Secondary | ICD-10-CM

## 2012-01-18 DIAGNOSIS — I509 Heart failure, unspecified: Secondary | ICD-10-CM

## 2012-01-18 DIAGNOSIS — M1990 Unspecified osteoarthritis, unspecified site: Secondary | ICD-10-CM

## 2012-01-18 MED ORDER — CELECOXIB 100 MG PO CAPS: 100.0000 mg | ORAL_CAPSULE | Freq: Two times a day (BID) | ORAL | Status: DC | PRN

## 2012-01-18 NOTE — Assessment & Plan Note (Signed)
Much improved him a stool culture and lactoferrin negative. No change

## 2012-01-18 NOTE — Assessment & Plan Note (Signed)
Good compliance with Lasix, currently taking 20 mg, 40 mg alternate. The patient today have some edema, his weight however has decrease. Will take some extra Lasix for few days then go back to normal dose

## 2012-01-18 NOTE — Progress Notes (Signed)
  Subjective:    Patient ID: Antonio Hester, male    DOB: 28-Apr-1931, 78 y.o.   MRN: [id number]  HPI Acute visit, here with his son, we discussed the following issues: On an off sore throat for 1 month, last 2 or 3 days,occasionally  associated with raspy voice. Patient still concerned about a lower extremity edema, taking Lasix 20mg  and 40 mg every other day. He was recently seen with diarrhea, it has improved significantly. History of DJD, takes Celebrex sporadically, on average 2 times a week.  Past Medical History:   Cardiovascular , Dr Junius Finner ([facility]) CAD   Atrial fibrillation s/p ablation 07-2007   h/o complete heart block and pacemaker dependence , s/p defib change 06-2008   Non-ischemic cardiomyopathy   Coumadin f/u at [name] office   -----------------------------------------   COPD   H/o a LUNG ABSCESS   BPH   SZ d/o Diplopia  Hyperlipidemia   Hypertension   GERD-- severe esophagitis per EGD 2007   osteopenia --per DEXA 6/11   osteoarthritis   MDs-- Neurology in [facility], Cardiology ([name] @ [facility], [name] @ [facility]), pulmonary [name] , urology , GI [name]    Past Surgical History:   Pacemaker: (St. Jude)   Defibrillator- 06/2008    Review of Systems No fever chills Mild cough which is at baseline Mild nasal congestion, occasional nasal drip.     Objective:   Physical Exam  General -- alert, well-developed, and frail appearing.    HEENT -- throat with moist, no redness, no discharge. Nose with minimal congestion Lungs -- decreased sounds otherwise clear  Heart-- irreg Extremities--  No ankle edema, some edema over the sock bilaterally  Neurologic-- alert & oriented X3       Assessment & Plan:   Sore throat, Exam negative, afebrile, recommend observation.

## 2012-01-18 NOTE — Assessment & Plan Note (Signed)
Patient knows an ophthalmology the likes to refer him to [facility], printed last local neuro note to facilitate referral

## 2012-01-18 NOTE — Patient Instructions (Signed)
Keep legs elevated, for the next 5 days, take Lasix 40 mg daily.  After that go back to alternate 20 mg and 40 mg every other day. If the swelling gets worse let me know. Next office visit 3 months

## 2012-01-18 NOTE — Assessment & Plan Note (Addendum)
Takes Celebrex as needed, low dose, will go ahead and refill Celebrex. Recommend to minimize  use.

## 2012-01-19 ENCOUNTER — Encounter: Payer: Self-pay | Admitting: Internal Medicine

## 2012-02-08 ENCOUNTER — Telehealth: Payer: Self-pay | Admitting: Internal Medicine

## 2012-02-08 MED ORDER — HYDROCODONE-ACETAMINOPHEN 2.5-500 MG PO TABS: 1.0000 | ORAL_TABLET | Freq: Four times a day (QID) | ORAL | Status: DC | PRN

## 2012-02-08 NOTE — Telephone Encounter (Signed)
Spoke with Herbert Seta, the caregiver & discussed instructions. She stated she is going to the pt's house to discuss this with the pt & the pt's son. I called the pt's son & explained everything to him. He understood & I advised him if they have any questions or concerns to give [place] a call.  Sent in rx to pharmacy.

## 2012-02-08 NOTE — Telephone Encounter (Signed)
Caregiver's report the patient fell trying to help his wife, developed pain at the right chest, I ordered an x-ray, it showed fractures of the right eighth through 12th ribs with some atelectasis but no other complicating features. Please call the care givers: Needs pain control, I just did a prescription for low dose of hydrocodone to be used as needed, stop hydrocodone if the patient gets too sleepy. Fall prevention! Needs more help to get around Call or go to the ER with difficulty breathing, pain is not well controlled, cough fever. Incentive spirometry 3 times a day.

## 2012-02-10 ENCOUNTER — Ambulatory Visit (INDEPENDENT_AMBULATORY_CARE_PROVIDER_SITE_OTHER): Payer: Medicare Other | Admitting: Internal Medicine

## 2012-02-10 ENCOUNTER — Encounter: Payer: Self-pay | Admitting: Internal Medicine

## 2012-02-10 VITALS — BP 128/82 | HR 100 | Temp 97.7°F

## 2012-02-10 DIAGNOSIS — S2239XA Fracture of one rib, unspecified side, initial encounter for closed fracture: Secondary | ICD-10-CM

## 2012-02-10 NOTE — Patient Instructions (Addendum)
Continue taking hydrocodone to control the pain, take the least amount that is needed. Continue doing the exercises with balloons, at least 3 times a day for the next 2 weeks Call anytime if the pain is severe, you have increased cough, green sputum of  fever

## 2012-02-10 NOTE — Assessment & Plan Note (Signed)
Right rib fractures after an accidental fall, pain currently well controlled with a low dose of hydrocodone. I emphasized the need to continue doing respiratory exercises 3 times a day. Recommend to call me if the pain increases, has fever chills, green sputum. Most importantly, we talk about  fall prevention, at this point his son we will stay with the pt and his wife at night

## 2012-02-10 NOTE — Progress Notes (Signed)
  Subjective:    Patient ID: Antonio Hester, male    DOB: 03-20-1931, 78 y.o.   MRN: [id number]  HPI Acute visit, here with his son. He had an accidental fall a couple of days ago while he was trying to help his wife. Hit his right chest with a corner of a furniture, developed severe pain, subsequently a x-ray showed fractures of the right eighth through 12th ribs with some atelectasis but no other complicating features.  He tried Tylenol Celebrex but reported the pain was severe, I prescribed a low dose of hydrocodone, he is tolerating and well, does get slightly sleepy for 2 hours after hydrocodone. Currently taking every 6 hours.   Past Medical History:   Cardiovascular , Dr Junius Finner ([facility]) CAD   Atrial fibrillation s/p ablation 07-2007   h/o complete heart block and pacemaker dependence , s/p defib change 06-2008   Non-ischemic cardiomyopathy   Coumadin f/u at [name] office   -----------------------------------------   COPD   H/o a LUNG ABSCESS   BPH   SZ d/o Diplopia  Hyperlipidemia   Hypertension   GERD-- severe esophagitis per EGD 2007   osteopenia --per DEXA 6/11   osteoarthritis   MDs-- Neurology in [facility], Cardiology ([name] @ [facility], Dr [facility] @ [facility]), pulmonary [name] , urology , GI [name]    Past Surgical History:   Pacemaker: (St. Jude)   Defibrillator- 06/2008    Review of Systems Denies loss of consciousness, syncope. Currently with no neck or back pain. Did have a scratch in the leg that is not bothering him.     Objective:   Physical Exam  General -- alert, well-developed, and frail appearing.  Lungs -- decreased sounds , scattered rhonchi bilaterally, no crackles. Area of the injury is at the  lateral right chest, it is palpated, no crepitus, minimal tenderness, no hematoma noted. Chest seems stable  Heart-- irreg  Neurologic-- alert & oriented X3      Assessment & Plan:

## 2012-02-13 ENCOUNTER — Telehealth: Payer: Self-pay

## 2012-02-13 NOTE — Telephone Encounter (Signed)
[facility] Triage Call Report Triage Record Num: [id number] Operator: Tomasita Crumble Patient Name: Antonio Hester Call Date & Time: 02/12/2012 11:08:46AM Patient Phone: [phone] PCP: Nolon Rod. Paz Patient Gender: Male PCP Fax : Patient DOB: 1930/09/02 Practice Name: [facility] Reason for Call: Caller: Stewart/Other; PCP: Willow Ora; CB#: [phone]; Call regarding Recent fall with 5 fx ribs; seen 9/20; on Hydrocodone; Confusion reported; caller states patient called 911 telling them that [place] airport. Asks if there is pain medication that can be given that may not cause so much confusion. Patient is resident at [facility]. Took pain meds only twice in past 24 hours. He seems mildly disoriented after waking up, otherwise is oriented and alert. Contacted Dr. Fabian Sharp on call provider. Orders rec'd: may take Tylenol, or cut tablet in half and follow up with provider on 9/23. Caller informed of orders rec'd and to follow up with Dr. Drue Novel on 9/23. Protocol(s) Used: Confusion, Disorientation, Agitation Recommended Outcome per Protocol: See Provider within 72 Hours Reason for Outcome: All other situations Care Advice: ~ Copied from C-A-N after hour message

## 2012-02-13 NOTE — Telephone Encounter (Signed)
Spoke with Roseanne Reno & he is going to contact [facility] & then will let us know what they need from [place] to get the pt into the skilled nursing unit.

## 2012-02-13 NOTE — Telephone Encounter (Signed)
Spoke with the pt's son stewart & advised him to stop hydrocodone & the pt needs to go to the ER. Roseanne Reno stated that the pt has not had any hydrocodone in over 28 hours & has not taken tylenol for pain in 9 hours. Pt's son does not think that the pt needs to go to the ER but would like to know if Dr. Drue Novel thinks it would benefit the pt to be placed into the "skilled nursing unit" @ [facility] for 14 days so he could be assessed by a physician there. Please advise.

## 2012-02-13 NOTE — Telephone Encounter (Signed)
Yes please , needs to be assessed because sometimes mental status changes are related to things like a UTI , etc

## 2012-02-13 NOTE — Telephone Encounter (Signed)
Pt's son calling back because he has not heard back yet and also the nurse from [facility] came by to see him and agreed that he is lethargic and confused.  He verified that last vicodin was 9:00am yesterday and last tylenol was 9:00pm yesterday.  Also he wanted you to know that he can do 14 days of rehab at [facility] for free if you feel like that would be helpful for him.

## 2012-02-13 NOTE — Telephone Encounter (Signed)
He continued to be lethargic and confused at this time, he needs to go to the  ER now. We'll have to manage his pain with Tylenol only, stop hydrocodone.

## 2012-02-13 NOTE — Telephone Encounter (Signed)
FL2 forms have been received & faxed back to [facility].

## 2012-02-13 NOTE — Telephone Encounter (Signed)
Pt's son calling.  His dad was started on vicodin for fractured ribs last week.  Saturday night/Sunday morning he got really confused.  He called 911 from his bed saying [place].  His son was in the spare room.  They tracked his phone and realized he was still at home and had the security guard come and check on him.  They then decided to try just tylenol.  Since then he is better, but still having moments where he is not talking coherently.  Yesterday he dozed a lot.  He does drink a couple of drinks every night and he is wondering if the combination of the ETOH and vicodin could make him worse.  He seems to not be in any more pain than when he was on the pain med.  He has not really had any confusion prior to taking the vicodin other than one other time when taking a lot of dilantin.   He is concerned because his mom had a similar episode before being dx with dementia.  He said he can bring him back in if need be.  Son thinks stopping the ETOH will be difficult for him.  Last vicodin was Saturday or Sunday 9:00am he is not sure.

## 2012-02-16 ENCOUNTER — Telehealth: Payer: Self-pay | Admitting: *Deleted

## 2012-02-16 NOTE — Telephone Encounter (Signed)
Left msg for pt's son to return phone call & reference to the msg that was left at the office about the pt's medications.

## 2012-02-23 ENCOUNTER — Telehealth: Payer: Self-pay

## 2012-02-23 NOTE — Telephone Encounter (Signed)
Pt son asked for pt last 2 weighs and visits. Pt son advised me his father fell and cracked some bones and lost weight.        MW

## 2012-02-29 ENCOUNTER — Other Ambulatory Visit: Payer: Self-pay | Admitting: Internal Medicine

## 2012-02-29 NOTE — Telephone Encounter (Signed)
Refill done.  

## 2012-03-02 ENCOUNTER — Encounter: Payer: Self-pay | Admitting: Internal Medicine

## 2012-03-07 ENCOUNTER — Other Ambulatory Visit: Payer: Self-pay | Admitting: Internal Medicine

## 2012-03-07 MED ORDER — CELECOXIB 100 MG PO CAPS: 100.0000 mg | ORAL_CAPSULE | Freq: Two times a day (BID) | ORAL | Status: DC | PRN

## 2012-03-07 NOTE — Telephone Encounter (Signed)
refill Celebrex 100MG  capsules #180 TK 1 C PO BID AS NEEDED -- LAST FILL 9.4.13 LAST OV 9.20.13-FOLLOW UP

## 2012-03-07 NOTE — Telephone Encounter (Signed)
Refill done.  

## 2012-04-04 ENCOUNTER — Ambulatory Visit: Payer: Medicare Other | Admitting: Internal Medicine

## 2012-04-04 ENCOUNTER — Other Ambulatory Visit: Payer: Self-pay | Admitting: Internal Medicine

## 2012-04-04 NOTE — Telephone Encounter (Signed)
Refill done.  

## 2012-04-10 ENCOUNTER — Ambulatory Visit (INDEPENDENT_AMBULATORY_CARE_PROVIDER_SITE_OTHER): Payer: Medicare Other | Admitting: Internal Medicine

## 2012-04-10 VITALS — BP 112/80 | HR 75 | Temp 97.4°F | Wt 160.0 lb

## 2012-04-10 DIAGNOSIS — R259 Unspecified abnormal involuntary movements: Secondary | ICD-10-CM

## 2012-04-10 DIAGNOSIS — S2239XA Fracture of one rib, unspecified side, initial encounter for closed fracture: Secondary | ICD-10-CM

## 2012-04-10 DIAGNOSIS — R4182 Altered mental status, unspecified: Secondary | ICD-10-CM

## 2012-04-10 DIAGNOSIS — I10 Essential (primary) hypertension: Secondary | ICD-10-CM

## 2012-04-10 DIAGNOSIS — R6251 Failure to thrive (child): Secondary | ICD-10-CM

## 2012-04-10 DIAGNOSIS — R251 Tremor, unspecified: Secondary | ICD-10-CM

## 2012-04-10 DIAGNOSIS — J449 Chronic obstructive pulmonary disease, unspecified: Secondary | ICD-10-CM

## 2012-04-10 DIAGNOSIS — R569 Unspecified convulsions: Secondary | ICD-10-CM

## 2012-04-10 NOTE — Assessment & Plan Note (Signed)
Recommend to take brovana and pulmicort daily. Also recommend to be sure he got a flu shot at the facility he lives at

## 2012-04-10 NOTE — Assessment & Plan Note (Signed)
Well-controlled, check a BMP, good compliance with Lasix as prescribed.

## 2012-04-10 NOTE — Patient Instructions (Addendum)
Go back to [place] and pulmicort daily  combivent as needed

## 2012-04-10 NOTE — Assessment & Plan Note (Signed)
Due to pain and confusion, was admitted to the SNF and recently discharged. Currently doing very well, takes occasional Celebrex. At some point use Lidoderm but he's not using that anymore. He also has a corsee that seems to help

## 2012-04-10 NOTE — Assessment & Plan Note (Signed)
prescription for a lift chair provided

## 2012-04-10 NOTE — Assessment & Plan Note (Signed)
A month ago saw a new neurology and was prescribed Sinemet, according to the patient made a huge difference.

## 2012-04-10 NOTE — Assessment & Plan Note (Signed)
Recently at a SNF due to pain and confusion, confusion felt to be a combination of increased Dilantin level and hydrocodone. Currently back to baseline

## 2012-04-10 NOTE — Assessment & Plan Note (Signed)
While at a SNF, Dilantin dose was increased to 4 tablets a day, he was noted to be confused,Dilantin level is high. Currently on Dilantin 100 mg ER once a day, will check a level. No seizure activity with current doses

## 2012-04-10 NOTE — Progress Notes (Signed)
  Subjective:    Patient ID: Antonio Hester, male    DOB: [birth year], 78 y.o.   MRN: [id number]  HPI Here with his son, he was admitted 02/13/2012 to 03/27/2012 to rehab, he had a fall, had a lot of pain, was slightly confused. Confusion  is resolved, they think it was related to Vicodin and Dilantin. Seizure disorder , apparently while at the SNF, Dilantin dose was increased to 4 tablets a day because his level was low, he got confused, he is back to 100 mg daily. Tremor, saw a new neurologist, about a month ago started Sinemet and has made a huge difference in his tremor.   Past Medical History:   Cardiovascular , Dr Junius Finner ([facility]) CAD   Atrial fibrillation s/p ablation 07-2007   h/o complete heart block and pacemaker dependence , s/p defib change 06-2008   Non-ischemic cardiomyopathy   Coumadin f/u at [name] office   -----------------------------------------   COPD   H/o a LUNG ABSCESS   BPH   SZ d/o Diplopia  Hyperlipidemia   Hypertension   GERD-- severe esophagitis per EGD 2007   osteopenia --per DEXA 6/11   osteoarthritis   MDs-- Neurology in [facility], Cardiology ([name] @ [facility], [name] @ [facility]), pulmonary [name] , urology , GI [name]    Past Surgical History:   Pacemaker: (St. Jude)   Defibrillator- 06/2008    Review of Systems Had a rib fracture, currently doing very well, taking essentially no pain medication except Celebrex from time to time. Medication list is reviewed, he's not taking brovana or Pulmicort as prescribed, he is using Combivent twice a day as needed. No recent seizure activity     Objective:   Physical Exam  General -- alert, well-developed, frail appearing.   Lungs -- normal respiratory effort, no intercostal retractions, no accessory muscle use, and few rhonchi, slightly decreased breath sounds. Patient is wearing a corse, exam limited.  Heart-- irreg.   Extremities--trace pretibial edema bilaterally Psych-- Cognition and judgment  appear intact. Alert and cooperative with normal attention span and concentration.  not anxious appearing and not depressed appearing.       Assessment & Plan:  Records from neurology and the rehab facility reviewed

## 2012-04-11 ENCOUNTER — Encounter: Payer: Self-pay | Admitting: Internal Medicine

## 2012-04-11 LAB — BASIC METABOLIC PANEL
BUN: 29 mg/dL — ABNORMAL HIGH (ref 6–23)
CO2: 30 mEq/L (ref 19–32)
Chloride: 102 mEq/L (ref 96–112)
Creatinine, Ser: 1.3 mg/dL (ref 0.4–1.5)
Glucose, Bld: 88 mg/dL (ref 70–99)
Potassium: 4.4 mEq/L (ref 3.5–5.1)

## 2012-04-12 ENCOUNTER — Encounter: Payer: Self-pay | Admitting: *Deleted

## 2012-04-22 ENCOUNTER — Other Ambulatory Visit: Payer: Self-pay | Admitting: Internal Medicine

## 2012-04-23 ENCOUNTER — Ambulatory Visit: Payer: Medicare Other | Admitting: Internal Medicine

## 2012-04-24 NOTE — Telephone Encounter (Signed)
Spoke with pharmacy & confirmed pt still has refills left on file. Refill request sent in error.

## 2012-05-07 ENCOUNTER — Other Ambulatory Visit: Payer: Self-pay | Admitting: Internal Medicine

## 2012-05-07 NOTE — Telephone Encounter (Signed)
Refill done.  

## 2012-05-08 ENCOUNTER — Telehealth: Payer: Self-pay | Admitting: *Deleted

## 2012-05-08 NOTE — Telephone Encounter (Signed)
Pt's son called requesting the handicap parking permit be renewed. Please advise.

## 2012-05-08 NOTE — Telephone Encounter (Signed)
done

## 2012-05-18 ENCOUNTER — Encounter: Payer: Self-pay | Admitting: Internal Medicine

## 2012-05-18 ENCOUNTER — Ambulatory Visit (INDEPENDENT_AMBULATORY_CARE_PROVIDER_SITE_OTHER): Payer: Medicare Other | Admitting: Internal Medicine

## 2012-05-18 VITALS — BP 140/72 | HR 81 | Temp 98.9°F | Wt 160.0 lb

## 2012-05-18 DIAGNOSIS — J449 Chronic obstructive pulmonary disease, unspecified: Secondary | ICD-10-CM

## 2012-05-18 MED ORDER — DOXYCYCLINE HYCLATE 100 MG PO TABS: 100.0000 mg | ORAL_TABLET | Freq: Two times a day (BID) | ORAL | Status: DC

## 2012-05-18 MED ORDER — PREDNISONE 10 MG PO TABS: ORAL_TABLET | ORAL | Status: DC

## 2012-05-18 NOTE — Assessment & Plan Note (Signed)
Presents with COPD exacerbation, has not been using Brovana or Pulmicort regularly. He is coughing frequently at night, I am hesitant to prescribe stronger cough suppressants than Mucinex DM. He is allergic to hydrocodone. Plan: see instructions

## 2012-05-18 NOTE — Patient Instructions (Addendum)
Rest, fluids , tylenol For cough, take Mucinex DM twice a day as needed  Flonase every day Prednisone as prescribed for few days Doxycycline for one week , twice a day Use Brovana and Pulmicort twice a day everyday Okay to take Combivent as needed. Take the antibiotic as prescribed  (doxycycline), it may affect your Coumadin, please ask your cardiologist to check your Coumadin Monday. Call if no better in few days ER if symptoms severe, high fever, chest pain, swelling in the legs

## 2012-05-18 NOTE — Progress Notes (Signed)
  Subjective:    Patient ID: Antonio Hester, male    DOB: March 04, 1931, 78 y.o.   MRN: [id number]  HPI Acute visit, here with his son 2 days history of a persisting cough, worse at night. Couldn't sleep. His son has also respiratory symptoms. He has not been using Brovana or Pulmicort daily, he did try the same today with relief of his symptoms. O2 sat today 96, at home O2 sat is between 95-97%.  Past Medical History:   Cardiovascular , Dr Junius Finner ([facility]) CAD   Atrial fibrillation s/p ablation 07-2007   h/o complete heart block and pacemaker dependence , s/p defib change 06-2008   Non-ischemic cardiomyopathy   Coumadin f/u at [name] office   -----------------------------------------   COPD   H/o a LUNG ABSCESS   BPH   SZ d/o Diplopia  Hyperlipidemia   Hypertension   GERD-- severe esophagitis per EGD 2007   osteopenia --per DEXA 6/11   osteoarthritis   MDs-- Neurology in [facility], Cardiology ([name] @ [facility], Dr [facility] @ [facility]), pulmonary [name] , urology , GI [name]    Past Surgical History:   Pacemaker: (St. Jude)   Defibrillator- 06/2008   Review of Systems No fever chills, does have some postnasal dripping. He reports some increase in his shortness of breath, wheezing and has been feeling some chest congestion. No lower extremity edema.     Objective:   Physical Exam  General -- alert, well-developed, frail appearing    HEENT --  throat w/o redness, face symmetric and not tender to palpation, nose is slightly congested Lungs --  no increased work of breathing, he has a few rhonchi and end expiratory wheezing. Breath sounds decreased. No crackles   Heart-- irreg   Extremities-- no pretibial edema bilaterally  Psych-- Cognition and judgment appear intact. Alert and cooperative with normal attention span and concentration.  not anxious appearing and not depressed appearing.       Assessment & Plan:

## 2012-05-22 ENCOUNTER — Telehealth: Payer: Self-pay | Admitting: *Deleted

## 2012-05-22 MED ORDER — PREDNISONE 10 MG PO TABS: ORAL_TABLET | ORAL | Status: DC

## 2012-05-22 NOTE — Telephone Encounter (Signed)
Advise patient: Lets avoid further antibiotics for now. I sent another round of prednisone. Needs to use Brovana and Pulmicort twice a day everyday and take all other medications as recommended . ER if symptoms severe, fever, chills

## 2012-05-22 NOTE — Telephone Encounter (Signed)
Left detailed message on voicemail for patient with Dr.Paz's instructions. Patient to call if questions or concerns. I repeated Dr.Paz's response slowly, clearly and x 2. I stressed that rx was sent to pharmacy and patient to pick-up

## 2012-05-22 NOTE — Telephone Encounter (Signed)
Pt seen  On 05-18-12  For cough. Pt still having cough and fatigue. Pt did have fever to spike but has been manage with tylenol. Pt would like to know if it would be possible to extend Rx for prednisone and doxycyline.

## 2012-06-01 ENCOUNTER — Telehealth: Payer: Self-pay | Admitting: Critical Care Medicine

## 2012-06-01 DIAGNOSIS — J449 Chronic obstructive pulmonary disease, unspecified: Secondary | ICD-10-CM

## 2012-06-01 MED ORDER — BUDESONIDE 0.25 MG/2ML IN SUSP: 0.2500 mg | Freq: Two times a day (BID) | RESPIRATORY_TRACT | Status: AC

## 2012-06-01 MED ORDER — ARFORMOTEROL TARTRATE 15 MCG/2ML IN NEBU: 15.0000 ug | INHALATION_SOLUTION | Freq: Two times a day (BID) | RESPIRATORY_TRACT | Status: DC

## 2012-06-01 NOTE — Telephone Encounter (Signed)
Crystal please advise on these refills for the pt.  Should we bring in him for these?  thanks

## 2012-06-01 NOTE — Telephone Encounter (Signed)
Pt was last seen by Dr. Delford Field on 09/12/11 and meds are on pt's current med list.  So yes, ok to refill.  I have called rxs into Rich with [facility] who verbalized understanding and voiced no further questions or concerns at this time  I called, spoke with pt's son.  Advised budesonide and brovana rxs were called in.  I did ask he pls have pt schedule a f/u with PW prior to April 2014.  He verbalized understanding, was appreciative of rxs being called in, and stated he would like to go ahead and schedule a follow up with PW in Feb.  We have scheduled OV for pt on Feb  At 2:30 pm in [facility] -- son aware and will call back if anything further is needed prior to this.

## 2012-06-14 ENCOUNTER — Other Ambulatory Visit: Payer: Self-pay

## 2012-06-14 DIAGNOSIS — I6529 Occlusion and stenosis of unspecified carotid artery: Secondary | ICD-10-CM

## 2012-06-25 ENCOUNTER — Encounter: Payer: Self-pay | Admitting: Neurosurgery

## 2012-06-26 ENCOUNTER — Encounter: Payer: Self-pay | Admitting: Neurosurgery

## 2012-06-26 ENCOUNTER — Ambulatory Visit (INDEPENDENT_AMBULATORY_CARE_PROVIDER_SITE_OTHER): Payer: Medicare Other | Admitting: Neurosurgery

## 2012-06-26 ENCOUNTER — Other Ambulatory Visit (INDEPENDENT_AMBULATORY_CARE_PROVIDER_SITE_OTHER): Payer: Medicare Other | Admitting: *Deleted

## 2012-06-26 VITALS — BP 105/64 | HR 75 | Resp 16 | Ht 69.0 in | Wt 162.0 lb

## 2012-06-26 DIAGNOSIS — I6529 Occlusion and stenosis of unspecified carotid artery: Secondary | ICD-10-CM

## 2012-06-26 NOTE — Progress Notes (Signed)
[facility] [place] Carotid Office Note  CC: Carotid surveillance Referring Physician: Early  History of Present Illness: 79 year old male patient of Dr. Arbie Cookey followed for known carotid stenosis. The patient has a known right occlusion. The patient and his son deny any signs or symptoms of CVA, TIA, amaurosis fugax or any neural deficit. The patient does have multiple medical comorbidities and is on 3 L nasal cannula oxygen.  Past Medical History  Diagnosis Date  . CAD (coronary artery disease)   . Atrial fibrillation     s/p ablation 07/2007  . Heart block     complete, and pacemaker dependence s/p defib change 06/2008  . Cardiomyopathy     non ischemic  . COPD (chronic obstructive pulmonary disease)   . BPH (benign prostatic hyperplasia)   . Seizure     d/o (sees Dr.Love, last Ov 2006)  . Hyperlipemia   . Hypertension   . Abscess of lung   . GERD (gastroesophageal reflux disease)     severe esophagitis per EGD 2007  . Osteopenia     per DEXA 10/2009  . Osteoarthritis   . Carotid artery occlusion     right ICA -occluded/ left ICA 60-79% stenosis  . Inguinal hernia     ROS: [x]  Positive   [ ]  Denies    General: [ ]  Weight loss, [ ]  Fever, [ ]  chills Neurologic: [ ]  Dizziness, [ ]  Blackouts, [ ]  Seizure [ ]  Stroke, [ ]  "Mini stroke", [ ]  Slurred speech, [ ]  Temporary blindness; [ ]  weakness in arms or legs, [ ]  Hoarseness Cardiac: [ ]  Chest pain/pressure, [ ]  Shortness of breath at rest [ ]  Shortness of breath with exertion, [ ]  Atrial fibrillation or irregular heartbeat Vascular: [ ]  Pain in legs with walking, [ ]  Pain in legs at rest, [ ]  Pain in legs at night,  [ ]  Non-healing ulcer, [ ]  Blood clot in vein/DVT,   Pulmonary: [ ]  Home oxygen, [ ]  Productive cough, [ ]  Coughing up blood, [ ]  Asthma,  [ ]  Wheezing Musculoskeletal:  [ ]  Arthritis, [ ]  Low back pain, [ ]  Joint pain Hematologic: [ ]  Easy Bruising, [ ]  Anemia; [ ]   Hepatitis Gastrointestinal: [ ]  Blood in stool, [ ]  Gastroesophageal Reflux/heartburn, [ ]  Trouble swallowing Urinary: [ ]  chronic Kidney disease, [ ]  on HD - [ ]  MWF or [ ]  TTHS, [ ]  Burning with urination, [ ]  Difficulty urinating Skin: [ ]  Rashes, [ ]  Wounds Psychological: [ ]  Anxiety, [ ]  Depression   Social History History  Substance Use Topics  . Smoking status: Former Smoker -- 2.0 packs/day for 50 years    Types: Cigarettes    Quit date: 01/04/1996  . Smokeless tobacco: Never Used  . Alcohol Use: 4.0 oz/week    8 drink(s) per week     Comment: wine sometimes    Family History Family History  Problem Relation Age of Onset  . Heart disease Mother   . Cancer Mother   . Heart disease Father   . Heart attack Father     Allergies  Allergen Reactions  . Hydrocodone Other (See Comments)    Delusional.  . Penicillins     REACTION: rash    Current Outpatient Prescriptions  Medication Sig Dispense Refill  . acetaminophen (TYLENOL) 500 MG tablet Take 500 mg by mouth 2 (two) times daily.       [name] arformoterol (BROVANA) 15 MCG/2ML NEBU Take 2 mLs (  15 mcg total) by nebulization 2 (two) times daily.  120 mL  1  . aspirin 81 MG tablet Take 81 mg by mouth every other day.        . budesonide (PULMICORT) 0.25 MG/2ML nebulizer solution Take 2 mLs (0.25 mg total) by nebulization 2 (two) times daily.  120 mL  1  . carbidopa-levodopa (SINEMET IR) 25-100 MG per tablet Take 1 tablet by mouth 3 (three) times daily.      . carvedilol (COREG) 3.125 MG tablet Take 3.125 mg by mouth 2 (two) times daily with a meal.       . celecoxib (CELEBREX) 100 MG capsule Take 1 capsule (100 mg total) by mouth 2 (two) times daily as needed.  180 capsule  1  . Cholecalciferol (VITAMIN D PO) Take 1 tablet by mouth daily.      . Cyanocobalamin (VITAMIN B-12 PO) Take 1 tablet by mouth daily.      [name] Dextromethorphan-Guaifenesin 20-200 MG/15ML SYRP Take by mouth every morning.      . fluticasone (FLONASE) 50  MCG/ACT nasal spray Place 2 sprays into the nose daily.  16 g  6  . furosemide (LASIX) 20 MG tablet Take 2 tablets every other day, 1 tablet every other day      . Ipratropium-Albuterol (COMBIVENT RESPIMAT) 20-100 MCG/ACT AERS respimat Inhale 1 puff into the lungs 4 (four) times daily as needed.  1 Inhaler  5  . lisinopril (PRINIVIL,ZESTRIL) 5 MG tablet TAKE 1 TABLET BY MOUTH TWICE DAILY  60 tablet  5  . loratadine (CLARITIN) 10 MG tablet Take 10 mg by mouth daily.        . Multiple Vitamin (MULTIVITAMIN) tablet Take 1 tablet by mouth daily.      . phenytoin (DILANTIN) 100 MG ER capsule Take 100 mg by mouth daily.       Bertram Gala Glycol-Propyl Glycol (SYSTANE OP) Apply 1 drop to eye 4 (four) times daily.        . polyethylene glycol (MIRALAX / GLYCOLAX) packet Take 17 g by mouth daily.        . predniSONE (DELTASONE) 10 MG tablet 3 tabs x 2 days, 2 tabs x 2 days, 1 tab x 2 days  12 tablet  0  . simvastatin (ZOCOR) 40 MG tablet 40 mg.       . warfarin (COUMADIN) 5 MG tablet 5 mg. Take 1/2 tablet by mouth Monday-Wednesday-Friday.  Takes 1 tablet (5 mg) Tuesday-Thursday-Saturday-Sunday.      [name] doxycycline (VIBRA-TABS) 100 MG tablet Take 1 tablet (100 mg total) by mouth 2 (two) times daily.  14 tablet  0    Physical Examination  Filed Vitals:   06/26/12 1145  BP: 105/64  Pulse: 75  Resp:     Body mass index is 23.92 kg/(m^2).  General:  WDWN in NAD Gait: Normal HEENT: WNL Eyes: Pupils equal Pulmonary: normal non-labored breathing , without Rales, rhonchi,  wheezing Cardiac: RRR, without  Murmurs, rubs or gallops; Abdomen: soft, NT, no masses Skin: no rashes, ulcers noted  Vascular Exam Pulses: 3+ radial pulses bilaterally Carotid bruits: Carotid pulse on the left with a known right occlusion Extremities without ischemic changes, no Gangrene , no cellulitis; no open wounds;  Musculoskeletal: no muscle wasting or atrophy   Neurologic: A&O X 3; Appropriate Affect ; SENSATION:  normal; MOTOR FUNCTION:  moving all extremities equally. Speech is fluent/normal  Non-Invasive Vascular Imaging CAROTID DUPLEX 06/26/2012  Right ICA 0ccluded stenosis Left ICA 20 -  39 % stenosis   ASSESSMENT/PLAN: Asymptomatic patient with unchanged carotid duplex from previous exam, the patient will followup in one year with repeat carotid duplex. The patient and his son understand the signs and symptoms of CVA and knows to call 911 should this occur. The patient's questions were encouraged and answered.  Lauree Chandler ANP   Clinic MD: Early

## 2012-06-27 ENCOUNTER — Other Ambulatory Visit: Payer: Self-pay | Admitting: *Deleted

## 2012-06-27 DIAGNOSIS — I6529 Occlusion and stenosis of unspecified carotid artery: Secondary | ICD-10-CM

## 2012-06-27 DIAGNOSIS — Z48812 Encounter for surgical aftercare following surgery on the circulatory system: Secondary | ICD-10-CM

## 2012-06-28 ENCOUNTER — Ambulatory Visit (INDEPENDENT_AMBULATORY_CARE_PROVIDER_SITE_OTHER): Payer: Medicare Other | Admitting: Critical Care Medicine

## 2012-06-28 ENCOUNTER — Encounter: Payer: Self-pay | Admitting: Critical Care Medicine

## 2012-06-28 VITALS — BP 138/84 | HR 75 | Temp 97.4°F | Ht 70.0 in | Wt 157.0 lb

## 2012-06-28 DIAGNOSIS — J9611 Chronic respiratory failure with hypoxia: Secondary | ICD-10-CM

## 2012-06-28 DIAGNOSIS — R0902 Hypoxemia: Secondary | ICD-10-CM

## 2012-06-28 DIAGNOSIS — J9610 Chronic respiratory failure, unspecified whether with hypoxia or hypercapnia: Secondary | ICD-10-CM

## 2012-06-28 DIAGNOSIS — J449 Chronic obstructive pulmonary disease, unspecified: Secondary | ICD-10-CM

## 2012-06-28 NOTE — Progress Notes (Signed)
Subjective:    Patient ID: Antonio Hester, male    DOB: 14-Nov-1930, 79 y.o.   MRN: [id number]  HPI  79 y.o.WM copd  06/28/2012 Pt still with the cough, worse at night, but not every night.  Notes some nasal drainage. Dyspnea is the same.  No edema in the feet.  No chest pain .  Occ wheeze.   Pt denies any significant sore throat, nasal congestion or excess secretions, fever, chills, sweats, unintended weight loss, pleurtic or exertional chest pain, orthopnea PND, or leg swelling Pt denies any increase in rescue therapy over baseline, denies waking up needing it or having any early am or nocturnal exacerbations of coughing/wheezing/or dyspnea. Pt also denies any obvious fluctuation in symptoms with  weather or environmental change or other alleviating or aggravating factors     Past Medical History  Diagnosis Date  . CAD (coronary artery disease)   . Atrial fibrillation     s/p ablation 07/2007  . Heart block     complete, and pacemaker dependence s/p defib change 06/2008  . Cardiomyopathy     non ischemic  . COPD (chronic obstructive pulmonary disease)   . BPH (benign prostatic hyperplasia)   . Seizure     d/o (sees Dr.Love, last Ov 2006)  . Hyperlipemia   . Hypertension   . Abscess of lung   . GERD (gastroesophageal reflux disease)     severe esophagitis per EGD 2007  . Osteopenia     per DEXA 10/2009  . Osteoarthritis   . Carotid artery occlusion     right ICA -occluded/ left ICA 60-79% stenosis  . Inguinal hernia      Family History  Problem Relation Age of Onset  . Heart disease Mother   . Cancer Mother   . Heart disease Father   . Heart attack Father      History   Social History  . Marital Status: Married    Spouse Name: N/A    Number of Children: 2  . Years of Education: N/A   Occupational History  . Not on file.   Social History Main Topics  . Smoking status: Former Smoker -- 2.0 packs/day for 50 years    Types: Cigarettes    Quit date: 01/04/1996  .  Smokeless tobacco: Never Used  . Alcohol Use: 4.0 oz/week    8 drink(s) per week     Comment: wine sometimes  . Drug Use: No  . Sexually Active: Not on file   Other Topics Concern  . Not on file   Social History Narrative   Lives at [facility], has an apartment, lives w/ wife, has a dog     Allergies  Allergen Reactions  . Hydrocodone Other (See Comments)    Delusional.  . Penicillins     REACTION: rash     Outpatient Prescriptions Prior to Visit  Medication Sig Dispense Refill  . acetaminophen (TYLENOL) 500 MG tablet Take 500 mg by mouth 2 (two) times daily.       [name] arformoterol (BROVANA) 15 MCG/2ML NEBU Take 2 mLs (15 mcg total) by nebulization 2 (two) times daily.  120 mL  1  . aspirin 81 MG tablet Take 81 mg by mouth every other day.        . budesonide (PULMICORT) 0.25 MG/2ML nebulizer solution Take 2 mLs (0.25 mg total) by nebulization 2 (two) times daily.  120 mL  1  . carbidopa-levodopa (SINEMET IR) 25-100 MG per tablet Take  1 tablet by mouth 3 (three) times daily.      . carvedilol (COREG) 3.125 MG tablet Take 3.125 mg by mouth 2 (two) times daily with a meal.       . celecoxib (CELEBREX) 100 MG capsule Take 1 capsule (100 mg total) by mouth 2 (two) times daily as needed.  180 capsule  1  . Cholecalciferol (VITAMIN D PO) Take 1 tablet by mouth daily.      . Cyanocobalamin (VITAMIN B-12 PO) Take 1 tablet by mouth daily.      [name] Dextromethorphan-Guaifenesin 20-200 MG/15ML SYRP Take by mouth every morning.      . fluticasone (FLONASE) 50 MCG/ACT nasal spray Place 2 sprays into the nose daily.  16 g  6  . furosemide (LASIX) 20 MG tablet Take 2 tablets every other day, 1 tablet every other day      . Ipratropium-Albuterol (COMBIVENT RESPIMAT) 20-100 MCG/ACT AERS respimat Inhale 1 puff into the lungs 4 (four) times daily as needed.  1 Inhaler  5  . lisinopril (PRINIVIL,ZESTRIL) 5 MG tablet TAKE 1 TABLET BY MOUTH TWICE DAILY  60 tablet  5  . loratadine (CLARITIN) 10 MG  tablet Take 10 mg by mouth daily.        . Multiple Vitamin (MULTIVITAMIN) tablet Take 1 tablet by mouth daily.      . phenytoin (DILANTIN) 100 MG ER capsule Take 100 mg by mouth daily.       Bertram Gala Glycol-Propyl Glycol (SYSTANE OP) Apply 1 drop to eye 4 (four) times daily.        . polyethylene glycol (MIRALAX / GLYCOLAX) packet Take 17 g by mouth daily.        . simvastatin (ZOCOR) 40 MG tablet 40 mg.       . [DISCONTINUED] doxycycline (VIBRA-TABS) 100 MG tablet Take 1 tablet (100 mg total) by mouth 2 (two) times daily.  14 tablet  0  . [DISCONTINUED] predniSONE (DELTASONE) 10 MG tablet 3 tabs x 2 days, 2 tabs x 2 days, 1 tab x 2 days  12 tablet  0  . [DISCONTINUED] warfarin (COUMADIN) 5 MG tablet 5 mg. Take 1/2 tablet by mouth Monday-Wednesday-Friday.  Takes 1 tablet (5 mg) Tuesday-Thursday-Saturday-Sunday.      Last reviewed on 06/28/2012  2:50 PM by Storm Frisk, MD   Review of Systems  Constitutional:   No  weight loss, night sweats,  Fevers, chills, fatigue, lassitude. HEENT:   No headaches,  Difficulty swallowing,  Tooth/dental problems,  Sore throat,                No sneezing, itching, ear ache, nasal congestion, post nasal drip,   CV:  No chest pain,  Orthopnea, PND, swelling in lower extremities, anasarca, dizziness, palpitations  GI  No heartburn, indigestion, abdominal pain, nausea, vomiting, diarrhea, change in bowel habits, loss of appetite  Resp: Notes  shortness of breath with exertion and  at rest.  No excess mucus, no productive cough,  Notes  non-productive cough,  No coughing up of blood.  No change in color of mucus.  No wheezing.  No chest wall deformity  Skin: no rash or lesions.  GU: no dysuria, change in color of urine, no urgency or frequency.  No flank pain.  MS:  No joint pain or swelling.  No decreased range of motion.  No back pain.  Psych:  No change in mood or affect. No depression or anxiety.  No memory loss.  Objective:   Physical  Exam  Filed Vitals:   06/28/12 1440  BP: 138/84  Pulse: 75  Temp: 97.4 F (36.3 C)  TempSrc: Oral  Height: 5\' 10"  (1.778 m)  Weight: 157 lb (71.215 kg)  SpO2: 97%    Gen: Pleasant, well-nourished, in no distress,  normal affect  ENT: No lesions,  mouth clear,  oropharynx clear, no postnasal drip  Neck: No JVD, no TMG, no carotid bruits  Lungs: No use of accessory muscles, no dullness to percussion, distant bs  Cardiovascular: RRR, heart sounds normal, no murmur or gallops, no peripheral edema  Abdomen: soft and NT, no HSM,  BS normal  Musculoskeletal: No deformities, no cyanosis or clubbing, tender R pelvis area , no deformitiy  Neuro: alert, non focal  Skin: Warm, no lesions or rashes         Assessment & Plan:   COPD (chronic obstructive pulmonary disease) Gold stage D. COPD with chronic oxygen dependency Plan Maintain inhaled medications as prescribed Maintain oxygen therapy as prescribed  Chronic respiratory failure with hypoxia Chronic respiratory failure on the basis of hypoxemia and COPD    Updated Medication List Outpatient Encounter Prescriptions as of 06/28/2012  Medication Sig Dispense Refill  . acetaminophen (TYLENOL) 500 MG tablet Take 500 mg by mouth 2 (two) times daily.       [name] arformoterol (BROVANA) 15 MCG/2ML NEBU Take 2 mLs (15 mcg total) by nebulization 2 (two) times daily.  120 mL  1  . aspirin 81 MG tablet Take 81 mg by mouth every other day.        . budesonide (PULMICORT) 0.25 MG/2ML nebulizer solution Take 2 mLs (0.25 mg total) by nebulization 2 (two) times daily.  120 mL  1  . carbidopa-levodopa (SINEMET IR) 25-100 MG per tablet Take 1 tablet by mouth 3 (three) times daily.      . carvedilol (COREG) 3.125 MG tablet Take 3.125 mg by mouth 2 (two) times daily with a meal.       . celecoxib (CELEBREX) 100 MG capsule Take 1 capsule (100 mg total) by mouth 2 (two) times daily as needed.  180 capsule  1  . Cholecalciferol (VITAMIN D PO)  Take 1 tablet by mouth daily.      . Cyanocobalamin (VITAMIN B-12 PO) Take 1 tablet by mouth daily.      [name] Dextromethorphan-Guaifenesin 20-200 MG/15ML SYRP Take by mouth every morning.      . fluticasone (FLONASE) 50 MCG/ACT nasal spray Place 2 sprays into the nose daily.  16 g  6  . furosemide (LASIX) 20 MG tablet Take 2 tablets every other day, 1 tablet every other day      . Ipratropium-Albuterol (COMBIVENT RESPIMAT) 20-100 MCG/ACT AERS respimat Inhale 1 puff into the lungs 4 (four) times daily as needed.  1 Inhaler  5  . lisinopril (PRINIVIL,ZESTRIL) 5 MG tablet TAKE 1 TABLET BY MOUTH TWICE DAILY  60 tablet  5  . loratadine (CLARITIN) 10 MG tablet Take 10 mg by mouth daily.        . Multiple Vitamin (MULTIVITAMIN) tablet Take 1 tablet by mouth daily.      . phenytoin (DILANTIN) 100 MG ER capsule Take 100 mg by mouth daily.       Bertram Gala Glycol-Propyl Glycol (SYSTANE OP) Apply 1 drop to eye 4 (four) times daily.        . polyethylene glycol (MIRALAX / GLYCOLAX) packet Take 17 g by mouth daily.        [name]  Rivaroxaban (XARELTO) 20 MG TABS Take 20 mg by mouth daily.      . simvastatin (ZOCOR) 40 MG tablet 40 mg.       . [DISCONTINUED] doxycycline (VIBRA-TABS) 100 MG tablet Take 1 tablet (100 mg total) by mouth 2 (two) times daily.  14 tablet  0  . [DISCONTINUED] predniSONE (DELTASONE) 10 MG tablet 3 tabs x 2 days, 2 tabs x 2 days, 1 tab x 2 days  12 tablet  0  . [DISCONTINUED] warfarin (COUMADIN) 5 MG tablet 5 mg. Take 1/2 tablet by mouth Monday-Wednesday-Friday.  Takes 1 tablet (5 mg) Tuesday-Thursday-Saturday-Sunday.

## 2012-06-28 NOTE — Assessment & Plan Note (Signed)
Chronic respiratory failure on the basis of hypoxemia and COPD

## 2012-06-28 NOTE — Assessment & Plan Note (Signed)
Gold stage D. COPD with chronic oxygen dependency Plan Maintain inhaled medications as prescribed Maintain oxygen therapy as prescribed

## 2012-06-28 NOTE — Patient Instructions (Addendum)
No change in medications. Return in         4 months 

## 2012-07-13 ENCOUNTER — Ambulatory Visit (INDEPENDENT_AMBULATORY_CARE_PROVIDER_SITE_OTHER): Payer: Medicare Other | Admitting: Internal Medicine

## 2012-07-13 VITALS — BP 116/64 | HR 74 | Temp 97.9°F | Wt 157.0 lb

## 2012-07-13 DIAGNOSIS — J4489 Other specified chronic obstructive pulmonary disease: Secondary | ICD-10-CM

## 2012-07-13 DIAGNOSIS — R259 Unspecified abnormal involuntary movements: Secondary | ICD-10-CM

## 2012-07-13 DIAGNOSIS — R251 Tremor, unspecified: Secondary | ICD-10-CM

## 2012-07-13 DIAGNOSIS — M549 Dorsalgia, unspecified: Secondary | ICD-10-CM

## 2012-07-13 DIAGNOSIS — I2510 Atherosclerotic heart disease of native coronary artery without angina pectoris: Secondary | ICD-10-CM

## 2012-07-13 DIAGNOSIS — J449 Chronic obstructive pulmonary disease, unspecified: Secondary | ICD-10-CM

## 2012-07-13 MED ORDER — BENZONATATE 100 MG PO CAPS: 100.0000 mg | ORAL_CAPSULE | Freq: Two times a day (BID) | ORAL | Status: DC | PRN

## 2012-07-13 NOTE — Progress Notes (Signed)
  Subjective:    Patient ID: Antonio Hester, male    DOB: 07/14/1930, 79 y.o.   MRN: [id number]  HPI Routine office visit, here with his son. In general doing very well. COPD, good compliance with medications, not much coughing lately . Had a fall about 2 weeks ago, mechanical fall, no syncope. Had some shoulder pain but is now better. She also has some back ache, saw neurology they prescribe Tylenol No. 3, he is taking no more than 2 or 3 a day. Tremor , saw neurology yesterday, he is taking carbidopa levodopa, symptoms better.   Past Medical History:   Cardiovascular , Dr Junius Finner ([facility]) CAD   Atrial fibrillation s/p ablation 07-2007   h/o complete heart block and pacemaker dependence , s/p defib change 06-2008   Non-ischemic cardiomyopathy   Coumadin f/u at [name] office   -----------------------------------------   COPD   H/o a LUNG ABSCESS   BPH   SZ d/o Diplopia  Hyperlipidemia   Hypertension   GERD-- severe esophagitis per EGD 2007   osteopenia --per DEXA 6/11   osteoarthritis   MDs-- Neurology in [facility], Cardiology ([name] @ [facility], Dr [facility] @ [place]), pulmonary [name] , urology , GI [name]    Past Surgical History:   Pacemaker: (St. Jude)   Defibrillator- 06/2008    Review of Systems Appetite is very good. Shortness or breath and lower extremity edema at baseline. No mental status changes.     Objective:   Physical Exam General -- alert, well-developed, still frail appearing but stronger compared to previous visits Lungs -- no increased work of breathing, he has a few rhonchi, no wheezing. Breath sounds decreased. No crackles  Heart-- irreg  Extremities-- no pretibial edema bilaterally  Psych-- Cognition and judgment appear intact. Alert and cooperative with normal attention span and concentration. not anxious appearing and not depressed appearing.      Assessment & Plan:

## 2012-07-13 NOTE — Patient Instructions (Addendum)
Try to manage the pain with Celebrex and Tylenol only. Take as little Tylenol with codeine  and codeine syrup as you can as it may cause some drowsiness. Come back in 2 months for a check up

## 2012-07-14 ENCOUNTER — Encounter: Payer: Self-pay | Admitting: Internal Medicine

## 2012-07-14 NOTE — Assessment & Plan Note (Signed)
Pain, Neurology started  Tylenol No. 3, recommend to stick with the Celebrex and Tylenol and minimize the use of Tylenol No. 3 due to potential for mental confusion.

## 2012-07-14 NOTE — Assessment & Plan Note (Signed)
Tremors, reportedly quite improved with carbidopa levodopa

## 2012-07-14 NOTE — Assessment & Plan Note (Signed)
COPD, seems to be doing well. He is taking a codiene syrup and also [employer]. Recommend not to go back to codeine once the syrup is gone, I did RF the [employer].

## 2012-07-14 NOTE — Assessment & Plan Note (Signed)
CV, Last BMP normal, fluid well controlled

## 2012-08-02 ENCOUNTER — Other Ambulatory Visit: Payer: Self-pay | Admitting: Internal Medicine

## 2012-08-02 NOTE — Telephone Encounter (Signed)
07-13-12 Last OV, last filled #[phone]

## 2012-08-31 ENCOUNTER — Other Ambulatory Visit: Payer: Self-pay | Admitting: Internal Medicine

## 2012-08-31 NOTE — Telephone Encounter (Signed)
Refill done.  

## 2012-09-14 ENCOUNTER — Ambulatory Visit: Payer: Medicare Other | Admitting: Internal Medicine

## 2012-09-25 ENCOUNTER — Encounter: Payer: Self-pay | Admitting: Internal Medicine

## 2012-09-25 ENCOUNTER — Ambulatory Visit (INDEPENDENT_AMBULATORY_CARE_PROVIDER_SITE_OTHER): Payer: Medicare Other | Admitting: Internal Medicine

## 2012-09-25 VITALS — BP 122/72 | HR 75 | Temp 98.0°F | Wt 153.0 lb

## 2012-09-25 DIAGNOSIS — I6529 Occlusion and stenosis of unspecified carotid artery: Secondary | ICD-10-CM

## 2012-09-25 DIAGNOSIS — E559 Vitamin D deficiency, unspecified: Secondary | ICD-10-CM

## 2012-09-25 DIAGNOSIS — H532 Diplopia: Secondary | ICD-10-CM

## 2012-09-25 DIAGNOSIS — R569 Unspecified convulsions: Secondary | ICD-10-CM

## 2012-09-25 DIAGNOSIS — M949 Disorder of cartilage, unspecified: Secondary | ICD-10-CM

## 2012-09-25 DIAGNOSIS — E785 Hyperlipidemia, unspecified: Secondary | ICD-10-CM

## 2012-09-25 DIAGNOSIS — J449 Chronic obstructive pulmonary disease, unspecified: Secondary | ICD-10-CM

## 2012-09-25 DIAGNOSIS — I4891 Unspecified atrial fibrillation: Secondary | ICD-10-CM

## 2012-09-25 DIAGNOSIS — I509 Heart failure, unspecified: Secondary | ICD-10-CM

## 2012-09-25 LAB — CBC WITH DIFFERENTIAL/PLATELET
Basophils Absolute: 0.1 10*3/uL (ref 0.0–0.1)
Eosinophils Absolute: 1.2 10*3/uL — ABNORMAL HIGH (ref 0.0–0.7)
Hemoglobin: 11.5 g/dL — ABNORMAL LOW (ref 13.0–17.0)
Lymphocytes Relative: 15.7 % (ref 12.0–46.0)
Monocytes Relative: 11.4 % (ref 3.0–12.0)
Neutro Abs: 2.9 10*3/uL (ref 1.4–7.7)
Neutrophils Relative %: 50.1 % (ref 43.0–77.0)
RBC: 3.48 Mil/uL — ABNORMAL LOW (ref 4.22–5.81)
RDW: 15.3 % — ABNORMAL HIGH (ref 11.5–14.6)

## 2012-09-25 LAB — AST: AST: 14 U/L (ref 0–37)

## 2012-09-25 LAB — BASIC METABOLIC PANEL
Calcium: 8.7 mg/dL (ref 8.4–10.5)
Creatinine, Ser: 1.8 mg/dL — ABNORMAL HIGH (ref 0.4–1.5)
GFR: 38.5 mL/min — ABNORMAL LOW (ref >60.00)
Glucose, Bld: 100 mg/dL — ABNORMAL HIGH (ref 70–99)
Sodium: 135 mEq/L (ref 135–145)

## 2012-09-25 LAB — ALT: ALT: 7 U/L (ref 0–53)

## 2012-09-25 NOTE — Progress Notes (Signed)
  Subjective:    Patient ID: Antonio Hester, male    DOB: [birth year], 79 y.o.   MRN: [id number]  HPI Routine office visit, here with his wife and a caregiver. In general feeling well. Medication list reviewed, good compliance.  Past Medical History:   Cardiovascular , Dr Junius Finner ([facility]) CAD   Atrial fibrillation s/p ablation 07-2007   h/o complete heart block and pacemaker dependence , s/p defib change 06-2008   Non-ischemic cardiomyopathy   Coumadin f/u at [name] office   -----------------------------------------   COPD   H/o a LUNG ABSCESS   BPH   SZ d/o Diplopia  Hyperlipidemia   Hypertension   GERD-- severe esophagitis per EGD 2007   osteopenia --per DEXA 6/11   osteoarthritis   MDs-- Neurology in [facility], Cardiology ([name] @ [facility], Dr [facility] @ [place]), pulmonary [name] , urology , GI [name]    Past Surgical History:   Pacemaker: (St. Jude)   Defibrillator- 06/2008    Review of Systems No fever or chills. Occasionally cough up a small amount of clear sputum. Appetite is okay on and off. No recent falls.     Objective:   Physical Exam General -- alert, well-developed,  frail appearing But seems in good spirits.  Lungs -- no increased work of breathing, . Breath sounds decreased. No crackles  Heart-- irreg  Extremities-- no pretibial edema bilaterally  Psych-- Cognition and judgment appear intact. Alert and cooperative with normal attention span and concentration. not anxious appearing and not depressed appearing.      Assessment & Plan:

## 2012-09-25 NOTE — Assessment & Plan Note (Addendum)
bone density test 11/2008 showed mild  osteopenia Discussed order a   DEXA  ---- will do History of low vitamin D, recommend supplementation and  labs

## 2012-09-25 NOTE — Assessment & Plan Note (Addendum)
Last carotid ultrasound at [facility] 06-2012: Right carotid not eval due to well known occlusion Left carotid 0-39%

## 2012-09-25 NOTE — Assessment & Plan Note (Signed)
No recent FLP in the chart, per cardiology?

## 2012-09-25 NOTE — Assessment & Plan Note (Signed)
Saw neurology is 72- 2013, No further workup suggested

## 2012-09-25 NOTE — Patient Instructions (Addendum)
Next visit in 3 months, continue taking the same medications. Also take vitamin D 1000 units OTC 1 tablet daily

## 2012-09-25 NOTE — Assessment & Plan Note (Signed)
On Dilantin, Last phenytoin level low but he is asymptomatic. No change

## 2012-09-25 NOTE — Assessment & Plan Note (Signed)
Seems stable at this point.   

## 2012-09-25 NOTE — Assessment & Plan Note (Addendum)
Currently on Xarelto, he sees cardiology, I never get notes from them, patient aware.

## 2012-09-25 NOTE — Assessment & Plan Note (Signed)
Seems to be well-controlled, check a BMP

## 2012-09-28 ENCOUNTER — Encounter: Payer: Self-pay | Admitting: *Deleted

## 2012-09-28 LAB — VITAMIN D 1,25 DIHYDROXY
Vitamin D2 1, 25 (OH)2: <8 pg/mL
Vitamin D3 1, 25 (OH)2: 35 pg/mL

## 2012-10-09 ENCOUNTER — Telehealth: Payer: Self-pay | Admitting: *Deleted

## 2012-10-09 NOTE — Telephone Encounter (Signed)
Faxed order

## 2012-10-09 NOTE — Telephone Encounter (Signed)
Faxed received from [facility] requesting orders for ST & PT to evaluate & treat the pt. Pt's status has decline & had decreased in mobility. OK to order ST & PT? Please advise.

## 2012-10-09 NOTE — Telephone Encounter (Signed)
Yes

## 2012-10-12 ENCOUNTER — Other Ambulatory Visit: Payer: Self-pay | Admitting: Internal Medicine

## 2012-10-12 NOTE — Telephone Encounter (Signed)
Refill done.  

## 2012-10-22 ENCOUNTER — Ambulatory Visit (INDEPENDENT_AMBULATORY_CARE_PROVIDER_SITE_OTHER): Payer: Medicare Other | Admitting: Internal Medicine

## 2012-10-22 ENCOUNTER — Encounter: Payer: Self-pay | Admitting: Internal Medicine

## 2012-10-22 VITALS — BP 112/72 | HR 75 | Temp 97.5°F | Wt 163.0 lb

## 2012-10-22 DIAGNOSIS — J9610 Chronic respiratory failure, unspecified whether with hypoxia or hypercapnia: Secondary | ICD-10-CM

## 2012-10-22 DIAGNOSIS — R0902 Hypoxemia: Secondary | ICD-10-CM

## 2012-10-22 DIAGNOSIS — J449 Chronic obstructive pulmonary disease, unspecified: Secondary | ICD-10-CM

## 2012-10-22 DIAGNOSIS — I509 Heart failure, unspecified: Secondary | ICD-10-CM

## 2012-10-22 DIAGNOSIS — I4891 Unspecified atrial fibrillation: Secondary | ICD-10-CM

## 2012-10-22 DIAGNOSIS — J9611 Chronic respiratory failure with hypoxia: Secondary | ICD-10-CM

## 2012-10-22 NOTE — Assessment & Plan Note (Signed)
History of COPD, worse SOB than usual, likely CHF related.

## 2012-10-22 NOTE — Patient Instructions (Addendum)
Stop xarelto Aspirin 81 mg daily. Lasix 20 mg 3 tablets for 3 days, then 2 tablets daily. Come back in 10-12 days for a recheck ER if increase fatigue, more  falls, increased shortness of breath, fever or chills.

## 2012-10-22 NOTE — Progress Notes (Signed)
  Subjective:    Patient ID: Antonio Hester, male    DOB: December 01, 1930, 79 y.o.   MRN: [id number]  HPI Acute visit, here with his son. "I've been losing ground for the last 2 weeks". For the last 2 weeks he has been very weak, has noted fluid around the legs and is feeling more short of  breath than usual. Because of general weakness, he had 3 falls in this weekend. Fortunately he was able to brake the falls thu, no major injuries or loss of consciousness.  He does use a walker regularly.  Past Medical History:   Cardiovascular , Dr Junius Finner ([facility]) CAD   Atrial fibrillation s/p ablation 07-2007   h/o complete heart block and pacemaker dependence , s/p defib change 06-2008   Non-ischemic cardiomyopathy   Coumadin f/u at [name] office   -----------------------------------------   COPD   H/o a LUNG ABSCESS   BPH   SZ d/o Diplopia  Hyperlipidemia   Hypertension   GERD-- severe esophagitis per EGD 2007   osteopenia --per DEXA 6/11   osteoarthritis   MDs-- Neurology in [facility], Cardiology ([name] @ [facility], Dr [facility] @ [place]), pulmonary [name] , urology , GI [name]    Past Surgical History:   Pacemaker: (St. Jude)   Defibrillator- 06/2008   History   Social History  . Marital Status: Married    Spouse Name: N/A    Number of Children: 2  . Years of Education: N/A   Occupational History  . retired    Social History Main Topics  . Smoking status: Former Smoker -- 2.00 packs/day for 50 years    Types: Cigarettes    Quit date: 01/04/1996  . Smokeless tobacco: Never Used  . Alcohol Use: 4.0 oz/week    8 drink(s) per week     Comment: wine sometimes  . Drug Use: No  . Sexually Active: Not on file   Other Topics Concern  . Not on file   Social History Narrative   Lives at [facility], has an apartment, lives w/ wife, has a Nurse, mental health.   Son brings food, has acces to the cafeteria   Son oversees meds    Review of Systems Meds is reviewed and updated, see assessment  and plan. He is using his nebulizers more frequently No fever or chills No chest pain No neck or back pain No sputum production. No dysuria gross hematuria. No abdominal pain.     Objective:   Physical Exam  BP 112/72  Pulse 75  Temp(Src) 97.5 F (36.4 C) (Oral)  Wt 163 lb (73.936 kg)  BMI 23.39 kg/m2  SpO2 97% General -- alert, oriented, frail appearing, wheelchair bound.   Lungs -- No increased work of breathing, breath sounds are decreased bilaterally worse at bases. No rhonchi or crackles.   Heart-- irreg Extremities-- + to ++/+++ edema B Psych--    not anxious appearing and not depressed appearing.       Assessment & Plan:  Antonio Hester, reassess on RTC

## 2012-10-22 NOTE — Assessment & Plan Note (Signed)
SOB > than baseline, reassess after diuresis

## 2012-10-22 NOTE — Assessment & Plan Note (Addendum)
Has gained 10 pounds in the last month. The only change in medication was 09/10/2012: Cardiology discontinue lisinopril and carvedilol because his pulmonary disease and is nor on bystolic. Case discussed with cardiology, we agreed on temporarily increase Lasix. Plan: Lasix 20 mg 3 tablets for 3 days, then 2 tablets daily CBC and BMP Come back in 10-12 days. ?restart ACE inhibitors ER if sx worsen

## 2012-10-22 NOTE — Assessment & Plan Note (Addendum)
Patient had 3 falls this last weekend, after a discuss with cardiology, we both agree that he is a high risk for bleeding. I discussed with the patient and his son the need to stop xarelto in light of the falls and increased risk of injury , at the same time he will be at a higher risk of a stroke.  The son is somehow reluctant because prior to the last few days he has been doing better, but w/ 3 falls in the last 3 days and his overall frail state I believe risk of xarelto > benefits. We agreed to discontinue xarelto for now, could  restart once is better. Increase Aspirin from qod to daily.

## 2012-10-23 LAB — CBC WITH DIFFERENTIAL/PLATELET
Basophils Relative: 0.3 % (ref 0.0–3.0)
Eosinophils Absolute: 1.8 10*3/uL — ABNORMAL HIGH (ref 0.0–0.7)
Eosinophils Relative: 25.3 % — ABNORMAL HIGH (ref 0.0–5.0)
Hemoglobin: 10.2 g/dL — ABNORMAL LOW (ref 13.0–17.0)
Lymphocytes Relative: 11.4 % — ABNORMAL LOW (ref 12.0–46.0)
MCHC: 33.2 g/dL (ref 30.0–36.0)
Monocytes Relative: 11.5 % (ref 3.0–12.0)
Neutro Abs: 3.6 10*3/uL (ref 1.4–7.7)
Neutrophils Relative %: 51.5 % (ref 43.0–77.0)
RBC: 3.08 Mil/uL — ABNORMAL LOW (ref 4.22–5.81)
WBC: 7 10*3/uL (ref 4.5–10.5)

## 2012-10-23 LAB — BASIC METABOLIC PANEL
CO2: 24 mEq/L (ref 19–32)
Calcium: 8.8 mg/dL (ref 8.4–10.5)
Creatinine, Ser: 1.7 mg/dL — ABNORMAL HIGH (ref 0.4–1.5)
GFR: 42.24 mL/min — ABNORMAL LOW (ref >60.00)
Sodium: 140 mEq/L (ref 135–145)

## 2012-10-26 ENCOUNTER — Telehealth: Payer: Self-pay | Admitting: *Deleted

## 2012-10-26 DIAGNOSIS — I10 Essential (primary) hypertension: Secondary | ICD-10-CM

## 2012-10-26 DIAGNOSIS — D649 Anemia, unspecified: Secondary | ICD-10-CM

## 2012-10-26 NOTE — Telephone Encounter (Signed)
Lab orders entered

## 2012-10-29 ENCOUNTER — Other Ambulatory Visit (INDEPENDENT_AMBULATORY_CARE_PROVIDER_SITE_OTHER): Payer: Medicare Other

## 2012-10-29 DIAGNOSIS — D649 Anemia, unspecified: Secondary | ICD-10-CM

## 2012-10-29 DIAGNOSIS — I10 Essential (primary) hypertension: Secondary | ICD-10-CM

## 2012-10-29 LAB — BASIC METABOLIC PANEL
BUN: 40 mg/dL — ABNORMAL HIGH (ref 6–23)
Chloride: 106 mEq/L (ref 96–112)
GFR: 46.03 mL/min — ABNORMAL LOW (ref >60.00)
Potassium: 4 mEq/L (ref 3.5–5.1)
Sodium: 139 mEq/L (ref 135–145)

## 2012-10-30 ENCOUNTER — Encounter: Payer: Self-pay | Admitting: *Deleted

## 2012-11-01 ENCOUNTER — Other Ambulatory Visit: Payer: Self-pay | Admitting: *Deleted

## 2012-11-01 MED ORDER — FUROSEMIDE 20 MG PO TABS: ORAL_TABLET | ORAL | Status: DC

## 2012-11-01 NOTE — Telephone Encounter (Signed)
Rx sent 

## 2012-11-02 ENCOUNTER — Other Ambulatory Visit: Payer: Self-pay | Admitting: Internal Medicine

## 2012-11-02 NOTE — Telephone Encounter (Signed)
Refill done.  

## 2012-11-05 ENCOUNTER — Ambulatory Visit: Payer: Medicare Other | Admitting: Internal Medicine

## 2012-11-06 ENCOUNTER — Ambulatory Visit (INDEPENDENT_AMBULATORY_CARE_PROVIDER_SITE_OTHER): Payer: Medicare Other | Admitting: Internal Medicine

## 2012-11-06 VITALS — BP 132/74 | HR 75 | Temp 97.5°F | Wt 166.0 lb

## 2012-11-06 DIAGNOSIS — L8992 Pressure ulcer of unspecified site, stage 2: Secondary | ICD-10-CM

## 2012-11-06 DIAGNOSIS — I509 Heart failure, unspecified: Secondary | ICD-10-CM

## 2012-11-06 NOTE — Progress Notes (Signed)
  Subjective:    Patient ID: Antonio Hester, male    DOB: 1930-06-27, 79 y.o.   MRN: [id number]  HPI Followup from previous visit He was seen w/ leg weakness and falls, since then no other severe fall but he did slipped down from a chair one time. His weight was up, we increased Lasix temporarily to 3 qd, now  taking 2 a day. While he was taken 3 qd  the edema Improved. Also is developing a bed sore    Past Medical History:   Cardiovascular , Dr Junius Finner ([facility]) CAD   Atrial fibrillation s/p ablation 07-2007   h/o complete heart block and pacemaker dependence , s/p defib change 06-2008   Non-ischemic cardiomyopathy   Coumadin f/u at [name] office   -----------------------------------------   COPD   H/o a LUNG ABSCESS   BPH   SZ d/o Diplopia  Hyperlipidemia   Hypertension   GERD-- severe esophagitis per EGD 2007   osteopenia --per DEXA 6/11   osteoarthritis   MDs-- Neurology in [facility], Cardiology ([name] @ [facility], Dr [facility] @ [place]), pulmonary [name] , urology , GI [name]    Past Surgical History:   Pacemaker: (St. Jude)   Defibrillator- 06/2008      Review of Systems No fever or chills No cough or mucus production. Occasional diarrhea. In general his strength and stamina have improved to some extent.    Objective:   Physical Exam  Skin:        General -- alert, well-developed, frail appearing    Lungs -- On oxygen, but sounds decreased worse at the bases.[name]   Heart-- irreg   Extremities-- +/+++ pretibial edema bilaterally Psych--   not anxious appearing and not depressed appearing.       Assessment & Plan:  Pressure ulcer, see instructions   Weakness,   Slightly better.

## 2012-11-06 NOTE — Patient Instructions (Addendum)
Take 3 tablets of lasix a day Keep a log of your weight Need to see your heart doctor  by next week, please call if you need a referral --- Pressure ulcer: Frequent turning ?sheep skin Call if redness , discharge or odor  ---  next visit with me in 2 months       Pressure Ulcer A pressure ulcer is a sore that has formed from the break down of skin and exposure of deeper layers of tissue. It develops in areas of the body where there is unrelieved pressure. Pressure ulcers are usually found over a boney area, such as the shoulder blades, spine, lower back, hips, knees, ankles, and heels. RISK FACTORS  Decreased ability to move.  Decreased ability to feel pain or discomfort.  Excessive skin moisture from urine, stool, sweat, or secretions.  Poor nutrition.  Dehydration.  Tobacco, drug, or alcohol abuse.  Pulling sheets that are under a patient when changing his or her position.  Obesity.  Increased adult age.  Age of less than 2 years.  Premature newborns.  Hospitalization in a critical care unit for longer than four days with use of medical devices.  Prolonged use of medical devices.  Critical illness.  Anemia.  Traumatic brain injury.  Spinal cord injury.  Stroke.  Diabetes.  Poor blood glucose control.  Low blood pressure (hypotension).  Low oxygen levels.  Medicines that reduce blood flow.  Infection.  Obesity. STAGING PRESSURE ULCERS Your caregiver may determine the degree of severity (stage) of your pressure ulcer. The stages include:  Stage 1: The skin is red, and when the skin is pressed, it stays red.  Stage 2: The top layer of skin is gone, and there is a shallow, pink ulcer.  Stage 3: The ulcer becomes deeper, and it is more difficult to see the whole wound. Also, there may be yellow or brown parts, as well as pink and red parts.  Stage 4: The ulcer may be deep and red, pink, brown, white, or yellow. Bone or muscle may be  seen.  Unstageable pressure ulcer: The ulcer is covered almost completely with black, brown, or yellow tissue. It is not known how deep the ulcer is or what stage it is until this covering comes off.  Suspected deep tissue injury: A patient's skin can be injured from pressure or pulling on the skin when his or her position is changed. The skin appears purple or maroon. There may not be an opening in the skin, but there could be a blood-filled blister. This deep tissue injury is often difficult to see in people with darker skin tones. The site may open and become deeper in time. However, early interventions will help the area heal and may prevent the area from opening. DIAGNOSIS  Your caregiver will diagnose your pressure ulcer based on its appearance. Your caregiver may determine the stage of your pressure ulcer as well. Your caregiver may request tests to check for infection, assess your circulation, or to check for other diseases, such as diabetes. TREATMENT  Treatment of your pressure ulcer begins with determining what stage the ulcer is in. Your treatment team may include your caregiver, a wound care specialist, a nutritionist, a physical therapist, and a Careers adviser. Treatments include:   Moving or repositioning every 1 2 hours.  Using beds or mattresses to shift your body weight and pressure points frequently.  Improving your diet.  Cleaning and bandaging (dressing) the open wound.  Giving antibiotic medicines.  Removing damaged  tissue.  Surgery and sometimes skin grafts. HOME CARE INSTRUCTIONS  Follow the care plan that was started in the hospital.  Avoid staying in the same position for more than 2 hours. Use padding, devices, or mattresses to cushion your pressure points as directed by your caregiver.  Eat well. Take nutritional supplements and vitamins as directed by your caregiver.  Keep all follow-up appointments.  Take pain medicine as directed by your caregiver. SEEK  MEDICAL CARE IF:   Your pressure ulcer is not improving.  You do not know how to care for your pressure ulcer.  You notice other areas of redness on your skin. SEEK IMMEDIATE MEDICAL CARE IF:   You have increasing redness, swelling, or pain in your pressure ulcer.  You notice pus, or increased pus, coming from your pressure ulcer.  You have a fever.  You notice a bad smell coming from the wound or dressing.  Your pressure ulcer opens up again. MAKE SURE YOU:   Understand these instructions.  Will watch your condition.  Will get help right away if you are not doing well or get worse. Document Released: 05/09/2005 Document Revised: 04/25/2012 Document Reviewed: 12/24/2010 [facility] Patient Information 2014 [place], [place].

## 2012-11-07 ENCOUNTER — Encounter: Payer: Self-pay | Admitting: Internal Medicine

## 2012-11-07 ENCOUNTER — Telehealth: Payer: Self-pay | Admitting: Internal Medicine

## 2012-11-07 MED ORDER — FUROSEMIDE 20 MG PO TABS: 60.0000 mg | ORAL_TABLET | Freq: Every day | ORAL | Status: DC

## 2012-11-07 NOTE — Assessment & Plan Note (Signed)
CHF, Has not lose weight despite increasing Lasix, last BMP okay. Plan: Increase Lasix to 3 tablets daily, needs to see cardiology, see instructions

## 2012-11-07 NOTE — Telephone Encounter (Signed)
Patient's son is calling on behalf of the patient about his Furosemide Rx. States that at his appointment yesterday, Dr. Drue Novel advised him to increase the amount he takes but the patient is needing the prescription updated at the pharmacy so that he can receive more.

## 2012-11-07 NOTE — Telephone Encounter (Signed)
Spoke with pt's son & sent a rx to the pharmacy with new sig.

## 2012-11-10 ENCOUNTER — Other Ambulatory Visit: Payer: Self-pay | Admitting: Internal Medicine

## 2012-11-12 ENCOUNTER — Ambulatory Visit (HOSPITAL_BASED_OUTPATIENT_CLINIC_OR_DEPARTMENT_OTHER)
Admission: RE | Admit: 2012-11-12 | Discharge: 2012-11-12 | Disposition: A | Discharge status: Home / Self Care | Payer: Medicare Other | Source: Ambulatory Visit | Attending: Critical Care Medicine | Admitting: Critical Care Medicine

## 2012-11-12 ENCOUNTER — Ambulatory Visit (INDEPENDENT_AMBULATORY_CARE_PROVIDER_SITE_OTHER): Payer: Medicare Other | Admitting: Critical Care Medicine

## 2012-11-12 ENCOUNTER — Encounter: Payer: Self-pay | Admitting: Critical Care Medicine

## 2012-11-12 VITALS — BP 110/74 | HR 74 | Temp 98.2°F | Ht 67.0 in | Wt 163.0 lb

## 2012-11-12 DIAGNOSIS — J449 Chronic obstructive pulmonary disease, unspecified: Secondary | ICD-10-CM | POA: Insufficient documentation

## 2012-11-12 DIAGNOSIS — R05 Cough: Secondary | ICD-10-CM | POA: Insufficient documentation

## 2012-11-12 DIAGNOSIS — I5022 Chronic systolic (congestive) heart failure: Secondary | ICD-10-CM

## 2012-11-12 DIAGNOSIS — R059 Cough, unspecified: Secondary | ICD-10-CM | POA: Insufficient documentation

## 2012-11-12 DIAGNOSIS — J438 Other emphysema: Secondary | ICD-10-CM

## 2012-11-12 DIAGNOSIS — J4489 Other specified chronic obstructive pulmonary disease: Secondary | ICD-10-CM | POA: Insufficient documentation

## 2012-11-12 DIAGNOSIS — R918 Other nonspecific abnormal finding of lung field: Secondary | ICD-10-CM | POA: Insufficient documentation

## 2012-11-12 DIAGNOSIS — I10 Essential (primary) hypertension: Secondary | ICD-10-CM | POA: Insufficient documentation

## 2012-11-12 DIAGNOSIS — J439 Emphysema, unspecified: Secondary | ICD-10-CM

## 2012-11-12 DIAGNOSIS — I509 Heart failure, unspecified: Secondary | ICD-10-CM

## 2012-11-12 LAB — BASIC METABOLIC PANEL
CO2: 26 mEq/L (ref 19–32)
Calcium: 9 mg/dL (ref 8.4–10.5)
Glucose, Bld: 86 mg/dL (ref 70–99)
Potassium: 4.4 mEq/L (ref 3.5–5.3)
Sodium: 139 mEq/L (ref 135–145)

## 2012-11-12 NOTE — Telephone Encounter (Signed)
Refill done.  

## 2012-11-12 NOTE — Progress Notes (Signed)
Subjective:    Patient ID: Antonio Hester, male    DOB: 1930/06/16, 79 y.o.   MRN: [id number]  HPI  79 y.o.WM copd   11/12/2012 Chief Complaint  Patient presents with  . 4 month follow up    o2 sat 86% on 3l pulsed upon arrival to exam room.  increased SOB with activity x 6 wks, wheezing, weakness, and cough.  Cough with rarely prod with clear to yellowish mucus.  Pt notes more edema, cough, weakness, wheeze. Yellow mucus. Cannot walk more than 80ft on walker.  Saw PCP 2x in 6/14.  Pt then saw [facility]. Lasix dose changed Was 1 alt 2 qod.  Changed to 3/d x 3d. 6/2 - 6/17 then 2/d ?3/d and stay>>[facility]>>4/d tue/friday, then 2 dailyother days) (1/2 AM 1/2 3pm)  Using neb more often.  No difficulty with swallow. Throat is sore, notes heartburn several days per week. Pt lives in retirement apt, caregivers help during weekdays. Uses brovana /budesonide BID but falls asleep.        Past Medical History  Diagnosis Date  . CAD (coronary artery disease)   . Atrial fibrillation     s/p ablation 07/2007  . Heart block     complete, and pacemaker dependence s/p defib change 06/2008  . Cardiomyopathy     non ischemic  . COPD (chronic obstructive pulmonary disease)   . BPH (benign prostatic hyperplasia)   . Seizure     d/o (sees Dr.Love, last Ov 2006)  . Hyperlipemia   . Hypertension   . Abscess of lung(513.0)   . GERD (gastroesophageal reflux disease)     severe esophagitis per EGD 2007  . Osteopenia     per DEXA 10/2009  . Osteoarthritis   . Carotid artery occlusion     right ICA -occluded/ left ICA 60-79% stenosis  . Inguinal hernia      Family History  Problem Relation Age of Onset  . Heart disease Mother   . Cancer Mother   . Heart disease Father   . Heart attack Father      History   Social History  . Marital Status: Married    Spouse Name: N/A    Number of Children: 2  . Years of Education: N/A   Occupational History  . retired    Social History Main Topics   . Smoking status: Former Smoker -- 2.00 packs/day for 50 years    Types: Cigarettes    Quit date: 01/04/1996  . Smokeless tobacco: Never Used  . Alcohol Use: 4.0 oz/week    8 drink(s) per week     Comment: wine sometimes  . Drug Use: No  . Sexually Active: Not on file   Other Topics Concern  . Not on file   Social History Narrative   Lives at [facility], has an apartment, lives w/ wife, has a Nurse, mental health.   Son brings food, has acces to the cafeteria   Son oversees meds      Allergies  Allergen Reactions  . Hydrocodone Other (See Comments)    Delusional.  . Penicillins     REACTION: rash     Outpatient Prescriptions Prior to Visit  Medication Sig Dispense Refill  . acetaminophen (TYLENOL) 500 MG tablet Take 500 mg by mouth 2 (two) times daily.       [name] arformoterol (BROVANA) 15 MCG/2ML NEBU Take 2 mLs (15 mcg total) by nebulization 2 (two) times daily.  120 mL  1  . aspirin  81 MG tablet Take 81 mg by mouth daily.       . benzonatate (TESSALON) 100 MG capsule TAKE 1 CAPSULE BY MOUTH THREE TIMES DAILY AS NEEDED FOR COUGH  40 capsule  0  . budesonide (PULMICORT) 0.25 MG/2ML nebulizer solution Take 2 mLs (0.25 mg total) by nebulization 2 (two) times daily.  120 mL  1  . carbidopa-levodopa (SINEMET IR) 25-100 MG per tablet Take 1 tablet by mouth 3 (three) times daily.      . celecoxib (CELEBREX) 100 MG capsule Take 1 capsule (100 mg total) by mouth 2 (two) times daily as needed.  180 capsule  1  . Cholecalciferol (VITAMIN D PO) Take 1 tablet by mouth daily.      . Cyanocobalamin (VITAMIN B-12 PO) Take 1 tablet by mouth daily.      [name] Dextromethorphan-Guaifenesin 20-200 MG/15ML SYRP Take by mouth every morning.      . Ipratropium-Albuterol (COMBIVENT RESPIMAT) 20-100 MCG/ACT AERS respimat Inhale 1 puff into the lungs 4 (four) times daily as needed.  1 Inhaler  5  . loratadine (CLARITIN) 10 MG tablet Take 15 mg by mouth daily.       . Multiple Vitamin (MULTIVITAMIN) tablet Take 1  tablet by mouth daily.      . nebivolol (BYSTOLIC) 5 MG tablet Take 5 mg by mouth daily.      Bertram Gala Glycol-Propyl Glycol (SYSTANE OP) Apply 1 drop to eye 4 (four) times daily.        . simvastatin (ZOCOR) 40 MG tablet 40 mg.       . furosemide (LASIX) 20 MG tablet Take 3 tablets (60 mg total) by mouth daily. Take 3 tablets daily.  90 tablet  5  . phenytoin (DILANTIN) 100 MG ER capsule Take 100 mg by mouth daily.       . phenytoin (DILANTIN) 100 MG ER capsule TAKE 2 CAPSULES BY MOUTH TWICE DAILY  120 capsule  5   No facility-administered medications prior to visit.     Review of Systems  Constitutional:   No  weight loss, night sweats,  Fevers, chills, fatigue, lassitude. HEENT:   No headaches,  Difficulty swallowing,  Tooth/dental problems,  Sore throat,                No sneezing, itching, ear ache, nasal congestion, post nasal drip,   CV:  No chest pain,  Orthopnea, PND, swelling in lower extremities, anasarca, dizziness, palpitations  GI  No heartburn, indigestion, abdominal pain, nausea, vomiting, diarrhea, change in bowel habits, loss of appetite  Resp: Notes  shortness of breath with exertion and  at rest.  No excess mucus, no productive cough,  Notes  non-productive cough,  No coughing up of blood.  No change in color of mucus.  No wheezing.  No chest wall deformity  Skin: no rash or lesions.  GU: no dysuria, change in color of urine, no urgency or frequency.  No flank pain.  MS:  No joint pain or swelling.  No decreased range of motion.  No back pain.  Psych:  No change in mood or affect. No depression or anxiety.  No memory loss.     Objective:   Physical Exam  Filed Vitals:   11/12/12 0918 11/12/12 0924  BP:  110/74  Pulse:  74  Temp:  98.2 F (36.8 C)  TempSrc:  Oral  Height:  5\' 7"  (1.702 m)  Weight:  163 lb (73.936 kg)  SpO2: 86% 94%  Gen: Pleasant, well-nourished, in no distress,  normal affect  ENT: No lesions,  mouth clear,  oropharynx clear,  no postnasal drip  Neck: No JVD, no TMG, no carotid bruits  Lungs: No use of accessory muscles, no dullness to percussion, distant bs  Cardiovascular: RRR, heart sounds normal, no murmur or gallops, no peripheral edema  Abdomen: soft and NT, no HSM,  BS normal  Musculoskeletal: No deformities, no cyanosis or clubbing, tender R pelvis area , no deformitiy  Neuro: alert, non focal  Skin: Warm, no lesions or rashes         Assessment & Plan:   COPD (chronic obstructive pulmonary disease) gold class D. Gold D Copd with chronic respiratory failure Plan No change in medications Oxygen 3L on home concentrator and 4L pulse on inogen system Return 4 months Obtain CXR and BMET    Updated Medication List Outpatient Encounter Prescriptions as of 11/12/2012  Medication Sig Dispense Refill  . acetaminophen (TYLENOL) 500 MG tablet Take 500 mg by mouth 2 (two) times daily.       [name] arformoterol (BROVANA) 15 MCG/2ML NEBU Take 2 mLs (15 mcg total) by nebulization 2 (two) times daily.  120 mL  1  . aspirin 81 MG tablet Take 81 mg by mouth daily.       . benzonatate (TESSALON) 100 MG capsule TAKE 1 CAPSULE BY MOUTH THREE TIMES DAILY AS NEEDED FOR COUGH  40 capsule  0  . budesonide (PULMICORT) 0.25 MG/2ML nebulizer solution Take 2 mLs (0.25 mg total) by nebulization 2 (two) times daily.  120 mL  1  . carbidopa-levodopa (SINEMET IR) 25-100 MG per tablet Take 1 tablet by mouth 3 (three) times daily.      . celecoxib (CELEBREX) 100 MG capsule Take 1 capsule (100 mg total) by mouth 2 (two) times daily as needed.  180 capsule  1  . Cholecalciferol (VITAMIN D PO) Take 1 tablet by mouth daily.      . Cyanocobalamin (VITAMIN B-12 PO) Take 1 tablet by mouth daily.      [name] Dextromethorphan-Guaifenesin 20-200 MG/15ML SYRP Take by mouth every morning.      . furosemide (LASIX) 20 MG tablet Take by mouth daily. 4 daily on Tuesday and Friday and 2 daily on the other days      . Ipratropium-Albuterol  (COMBIVENT RESPIMAT) 20-100 MCG/ACT AERS respimat Inhale 1 puff into the lungs 4 (four) times daily as needed.  1 Inhaler  5  . loratadine (CLARITIN) 10 MG tablet Take 15 mg by mouth daily.       . Multiple Vitamin (MULTIVITAMIN) tablet Take 1 tablet by mouth daily.      . nebivolol (BYSTOLIC) 5 MG tablet Take 5 mg by mouth daily.      . phenytoin (DILANTIN) 100 MG ER capsule       . Polyethyl Glycol-Propyl Glycol (SYSTANE OP) Apply 1 drop to eye 4 (four) times daily.        . simvastatin (ZOCOR) 40 MG tablet 40 mg.       . [DISCONTINUED] furosemide (LASIX) 20 MG tablet Take 3 tablets (60 mg total) by mouth daily. Take 3 tablets daily.  90 tablet  5  . [DISCONTINUED] phenytoin (DILANTIN) 100 MG ER capsule Take 100 mg by mouth daily.       . [DISCONTINUED] phenytoin (DILANTIN) 100 MG ER capsule TAKE 2 CAPSULES BY MOUTH TWICE DAILY  120 capsule  5   No facility-administered encounter medications on file as  of 11/12/2012.

## 2012-11-12 NOTE — Progress Notes (Signed)
Quick Note:  Notify the patient that the Xray is stable and no pneumonia No change in medications are recommended. Continue current meds as prescribed at last office visit ______ 

## 2012-11-12 NOTE — Patient Instructions (Addendum)
Labs today Chest xray today No change in medications Oxygen 3L on home concentrator and 4L pulse on inogen system Return 4 months Suggest Dr Olga Millers of [facility] as new cardiology MD

## 2012-11-12 NOTE — Assessment & Plan Note (Signed)
Gold D Copd with chronic respiratory failure Plan No change in medications Oxygen 3L on home concentrator and 4L pulse on inogen system Return 4 months Obtain CXR and BMET

## 2012-11-12 NOTE — Progress Notes (Signed)
Quick Note:  Call pt and tell him labs stable. Potassium level ok, no additional potassium needed ______

## 2012-11-13 NOTE — Progress Notes (Signed)
Quick Note:  [id number] ______ 

## 2012-11-13 NOTE — Progress Notes (Signed)
Quick Note:  Spoke with pt's son, Roseanne Reno. Informed him of cxr results and recs per Dr. Delford Field. He verbalized understanding and voiced no further questions or concerns at this time. ______

## 2012-11-13 NOTE — Progress Notes (Signed)
Quick Note:  Spoke with pt's son, Roseanne Reno. Informed him of lab results and recs per Dr. Delford Field. He verbalized understanding and voiced no further questions or concerns at this time. ______

## 2012-11-26 ENCOUNTER — Other Ambulatory Visit: Payer: Self-pay | Admitting: Internal Medicine

## 2012-11-26 NOTE — Telephone Encounter (Signed)
Refill done.  

## 2012-11-29 ENCOUNTER — Telehealth: Payer: Self-pay | Admitting: *Deleted

## 2012-11-29 NOTE — Telephone Encounter (Signed)
Patient has chronic respiratory failure. If he has evidence of increased fluid retention such as lower extremity edema or weigth again, needs to be seen here or at cards. If has chills or increased sputum production-cough I also need to know. Otherwise we may need to increase oxygen to 4 L  24/ 7

## 2012-11-29 NOTE — Telephone Encounter (Signed)
Received a call from [facility], a physical therapist at [facility] concerned about patient's oxygen levels dropping when standing down to 79% on 4L [place]. She states they had to call the RN in last night due to SOB and oxygen levels were 91% at that time. She states she has advised his caregivers to do all activities at wheelchair level due to oxygen levels dropping with activity.

## 2012-11-29 NOTE — Telephone Encounter (Signed)
I agree, please discussed with the patient's son. If the patient is having chills needs to go to the ER.

## 2012-11-29 NOTE — Telephone Encounter (Signed)
Spoke with pt. Caregiver, Bonita Quin, she states pt. Is no longer having chills but continues to have respiratory issues as stated below. Informed her of the below information as well as made her aware of attempt to contact his son, Roseanne Reno. She voiced understanding.

## 2012-11-29 NOTE — Telephone Encounter (Signed)
Discussed with Radiation protection practitioner at [facility]. She states that per patient private caregivers, the patient did have chills last night but was afebrile. She is concerned about patient's caregiver situation as well as noticed decline in patient condition most recently. Attempted to contact pt. Son, Roseanne Reno to clarify and discuss below information. Left message on voicemail to please call office.

## 2012-11-30 NOTE — Telephone Encounter (Signed)
Lmovm for son, Roseanne Reno to please call office at earliest convenience.

## 2012-11-30 NOTE — Telephone Encounter (Signed)
Pt was scheduled for Saturday clinic. Per dr Drue Novel the pt should not be scheduled for Saturday clinic & should go to the ER if he is having any fluid retention. Detailed msg left on pt's son's vmail & Saturday clinic appt has been cancelled.

## 2012-11-30 NOTE — Telephone Encounter (Signed)
Spoke with son, verbalized understanding

## 2012-12-01 ENCOUNTER — Encounter: Payer: Medicare Other | Admitting: Family Medicine

## 2012-12-01 NOTE — Progress Notes (Signed)
This encounter was created in error - please disregard.

## 2012-12-04 ENCOUNTER — Encounter: Payer: Self-pay | Admitting: Internal Medicine

## 2012-12-04 ENCOUNTER — Ambulatory Visit (INDEPENDENT_AMBULATORY_CARE_PROVIDER_SITE_OTHER): Payer: Medicare Other | Admitting: Internal Medicine

## 2012-12-04 VITALS — BP 120/60 | HR 75 | Temp 97.4°F | Wt 164.4 lb

## 2012-12-04 DIAGNOSIS — R6883 Chills (without fever): Secondary | ICD-10-CM

## 2012-12-04 DIAGNOSIS — J449 Chronic obstructive pulmonary disease, unspecified: Secondary | ICD-10-CM

## 2012-12-04 DIAGNOSIS — I509 Heart failure, unspecified: Secondary | ICD-10-CM

## 2012-12-04 LAB — POCT URINALYSIS DIPSTICK
Bilirubin, UA: NEGATIVE
Blood, UA: NEGATIVE
Ketones, UA: NEGATIVE
Leukocytes, UA: NEGATIVE
pH, UA: 5

## 2012-12-04 NOTE — Assessment & Plan Note (Addendum)
Patient's son wonders if something can be done to get his father better, "the way he was  8 weeks ago, ?maybe prednisone".  Told the patient and his son that he seems to be very well for now, I don't know of anything I could do to make him feel better except continue with same medications and trying to  stay active. Will let pulmonary know about this comments, prednisone? Addendum, Discussed with pulmonary, will keep patient on prednisone 10 mg daily, prescription sent. Will ask patient to watch for fluid retention.

## 2012-12-04 NOTE — Progress Notes (Signed)
Subjective:    Patient ID: Antonio Hester, male    DOB: 16-Jan-1931, 79 y.o.   MRN: [id number]  HPI Acute visit Few days ago, they reportedly he reportedly had chills. His son is here with him, he has visit him every day for the last 3 days and has not observed any chills. Did have a quite severe nosebleed this morning that self resolve and they are concerned about it. The patient has also noted some soreness behind the knees, worse on the left.  Past Medical History  Diagnosis Date  . CAD (coronary artery disease)   . Atrial fibrillation     s/p ablation 07/2007  . Heart block     complete, and pacemaker dependence s/p defib change 06/2008  . Cardiomyopathy     non ischemic  . COPD (chronic obstructive pulmonary disease)   . BPH (benign prostatic hyperplasia)   . Seizure     d/o (sees Dr.Love, last Ov 2006)  . Hyperlipemia   . Hypertension   . Abscess of lung(513.0)   . GERD (gastroesophageal reflux disease)     severe esophagitis per EGD 2007  . Osteopenia     per DEXA 10/2009  . Osteoarthritis   . Carotid artery occlusion     right ICA -occluded/ left ICA 60-79% stenosis  . Inguinal hernia    Past Surgical History  Procedure Laterality Date  . Pacemaker insertion      st jude  . Cardiac defibrillator placement  06/2008  . Hernia repair  10/05/2009    right inguinal hernia   History   Social History  . Marital Status: Married    Spouse Name: N/A    Number of Children: 2  . Years of Education: N/A   Occupational History  . retired    Social History Main Topics  . Smoking status: Former Smoker -- 2.00 packs/day for 50 years    Types: Cigarettes    Quit date: 01/04/1996  . Smokeless tobacco: Never Used  . Alcohol Use: 4.0 oz/week    8 drink(s) per week     Comment: wine sometimes  . Drug Use: No  . Sexually Active: Not on file   Other Topics Concern  . Not on file   Social History Narrative   Lives at [facility], has an apartment, lives w/ wife,  has a Nurse, mental health.   Son brings food, has acces to the cafeteria   Son oversees meds      Review of Systems Good compliance with medications as prescribed. No fever. O2 sats drops in the 80s sometimes with exertion, otherwise in the 90s. Cough was increased last week but now is "better than baseline". No sputum production. Appetite at baseline. No dysuria gross hematuria.     Objective:   Physical Exam BP 120/60  Pulse 75  Temp(Src) 97.4 F (36.3 C) (Oral)  Wt 164 lb 6.4 oz (74.571 kg)  BMI 25.74 kg/m2  SpO2 92%  General -- alert, well-developed, NAD At rest.   HEENT --  Medial wall of the nostrils bilaterally with evidence of recent bleeding, not bleeding actively. Lungs -- Decreased breath sounds, worse at bases, no crackles. Extremities-- no actual pretibial edema. At the  posterior thighs, there is some pitting edema without redness, tenderness.  Neurologic-- alert & oriented X3 and strength normal in all extremities. Psych-- Cognition and judgment appear intact. Alert and cooperative with normal attention span and concentration.  not anxious appearing and not depressed appearing.  Assessment & Plan:   Chills last week, Chills now resolved, never had fever, respiratory symptoms at baseline. Plan: Check a urinalysis  Nosebleeds, See instructions  Today , I spent more than 25 min with the patient, >50% of the time counseling, see COPD

## 2012-12-04 NOTE — Patient Instructions (Addendum)
For nosebleeds: vaseline in the nose twice a day If nosebleed, apply pressure, ER if severe or not stopping after 15-20 minutes

## 2012-12-04 NOTE — Assessment & Plan Note (Signed)
CHF, Weight is at baseline, continue present care

## 2012-12-05 ENCOUNTER — Telehealth: Payer: Self-pay | Admitting: Internal Medicine

## 2012-12-05 MED ORDER — PREDNISONE 5 MG PO TABS: 10.0000 mg | ORAL_TABLET | Freq: Every day | ORAL | Status: DC

## 2012-12-05 NOTE — Addendum Note (Signed)
Addended by: Wanda Plump on: 12/05/2012 08:46 AM   Modules accepted: Orders

## 2012-12-05 NOTE — Telephone Encounter (Signed)
lmovm for son Antonio Hester to return call to office.

## 2012-12-05 NOTE — Telephone Encounter (Signed)
Spoke with Dr. Drue Novel, no follow up reccommended unless pt. Son felt he needed to go. Discussed with patient's son. Verbalized understanding.

## 2012-12-05 NOTE — Telephone Encounter (Signed)
Spoke with son, discussed below. Verbalized understanding. Would like to know if Dr. Drue Novel recommends an appointment with Dr. Delford Field as well to follow up on respiratory status at this time.

## 2012-12-05 NOTE — Telephone Encounter (Signed)
i sent the rx already

## 2012-12-05 NOTE — Telephone Encounter (Signed)
Advise patient's son: I discussed with pulmonary, will start Sultan on prednisone 5 mg 2 tablets daily, see if that make him feel better from the respiratory standpoint. Watch for fluid retention. F/u as recommended

## 2012-12-05 NOTE — Telephone Encounter (Signed)
Please clarify order with #tablets and refills. Will gladly send to pharmacy.

## 2012-12-07 NOTE — Addendum Note (Signed)
Addended by: Silvio Pate D on: 12/07/2012 11:43 AM   Modules accepted: Orders

## 2012-12-13 ENCOUNTER — Other Ambulatory Visit: Payer: Self-pay | Admitting: Internal Medicine

## 2012-12-13 DIAGNOSIS — R609 Edema, unspecified: Secondary | ICD-10-CM

## 2012-12-13 MED ORDER — FUROSEMIDE 20 MG PO TABS: 20.0000 mg | ORAL_TABLET | Freq: Two times a day (BID) | ORAL | Status: DC

## 2012-12-13 NOTE — Telephone Encounter (Signed)
Please call the patient's son, The dose he is currently taking is working well for him; correct the medication list to reflect what he is actually doing and RF  X 3  months

## 2012-12-13 NOTE — Telephone Encounter (Signed)
Attempted to call son, left message on voicemail to return our call to supply [place] with the correct dosage of pts lasix so that refill could be sent in.

## 2012-12-13 NOTE — Telephone Encounter (Signed)
Patient's son is calling back with Lasix dosage. He states that the patient is taking 1 pill in the morning and 1 pill in the afternoon. He does this every day except for Tuesday and Friday. On Tuesdays and Fridays the patient is taking 2 tablets in the morning and 2 tablets in the afternoon.

## 2012-12-13 NOTE — Telephone Encounter (Signed)
Please clarify correct sig. Requested refill is for 2 tablets daily except Monday, Wednesday and Friday take 1 tablet. However, lasix rx on med list states to take 4 tablets on Tuesday and Friday and 2 Tablets all other days. Thanks.

## 2012-12-14 ENCOUNTER — Other Ambulatory Visit: Payer: Self-pay | Admitting: *Deleted

## 2012-12-14 IMAGING — US US AORTA
1 series · 14 of 18 positions shown · non-contrast
Comparison: None.

CLINICAL DATA: Calcification on outside x-ray, question abdominal
aortic aneurysm.

AORTA/RETROPERITONEAL ULTRASOUND COMPLETE

[Series 1: us aorta · 0.24mm/px · 14 of 18 slices shown]
[im 1/18]
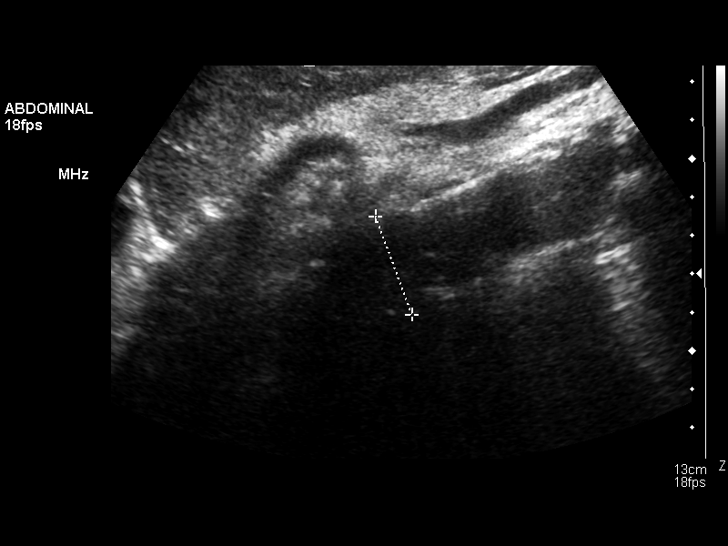
[im 2/18]
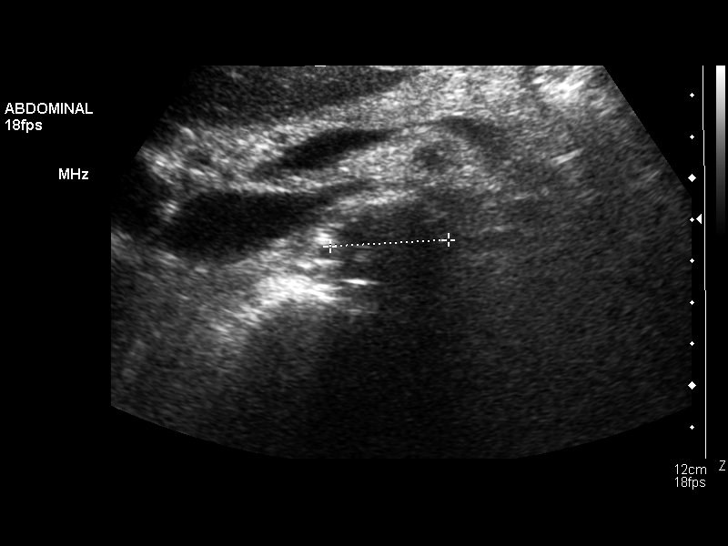
[im 4/18]
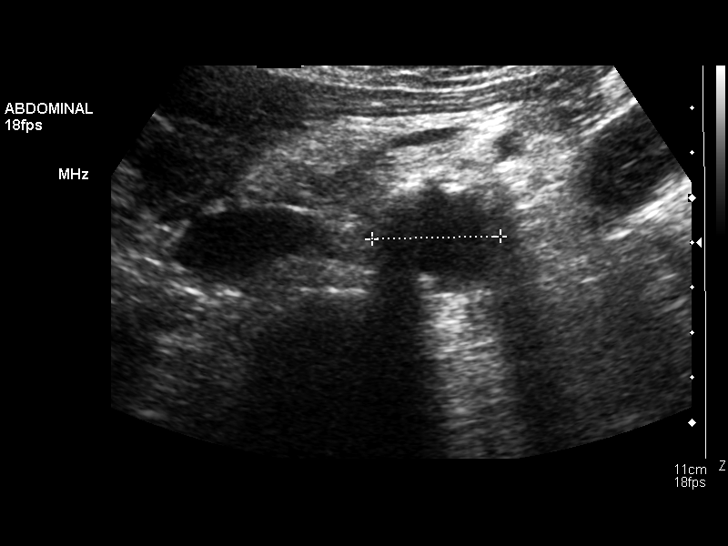
[im 5/18]
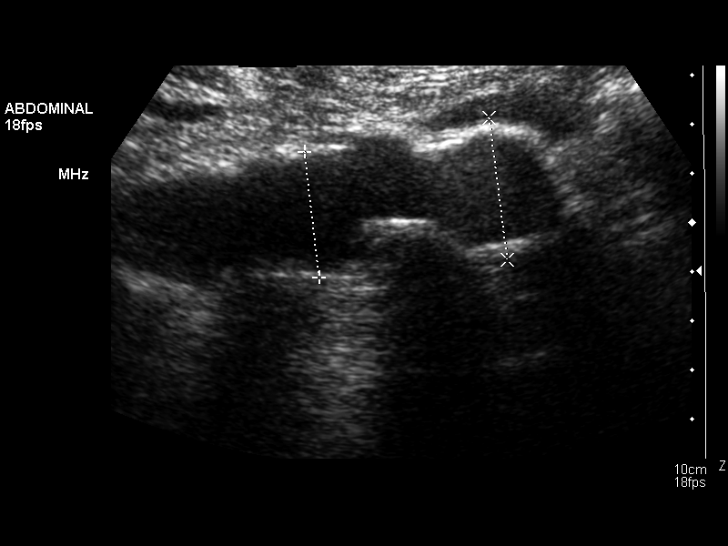
[im 6/18]
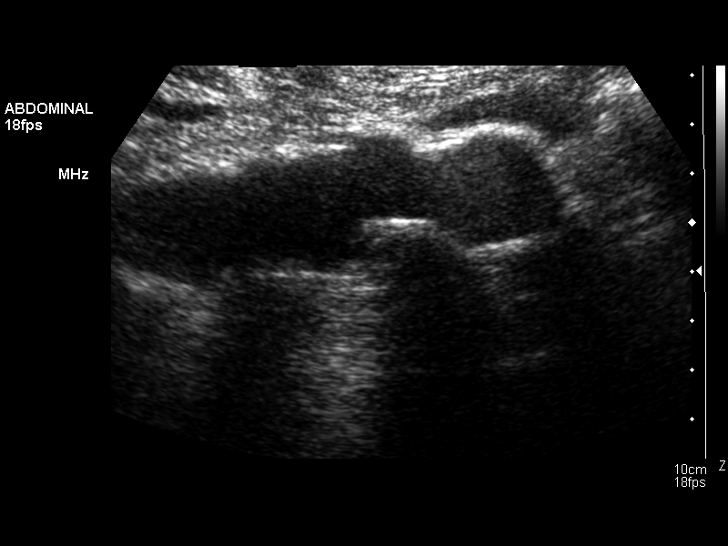
[im 8/18]
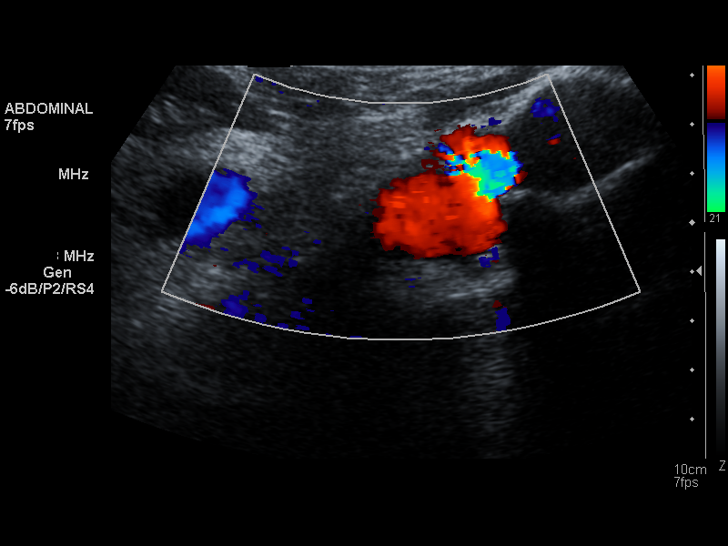
[im 9/18]
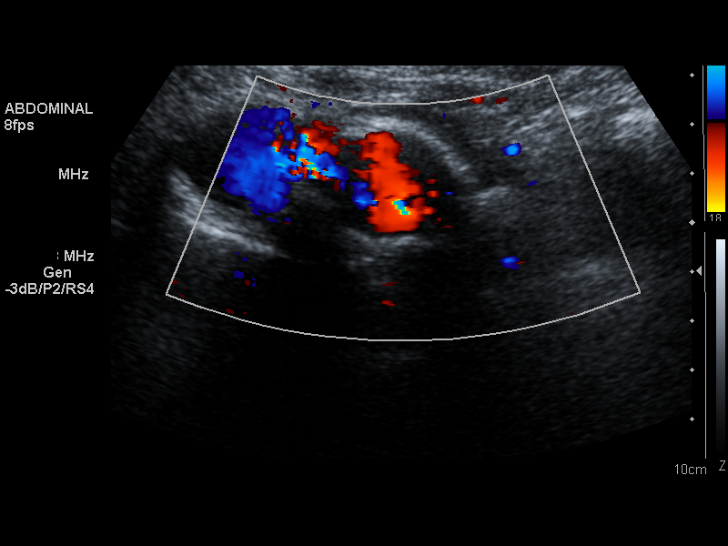
[im 10/18]
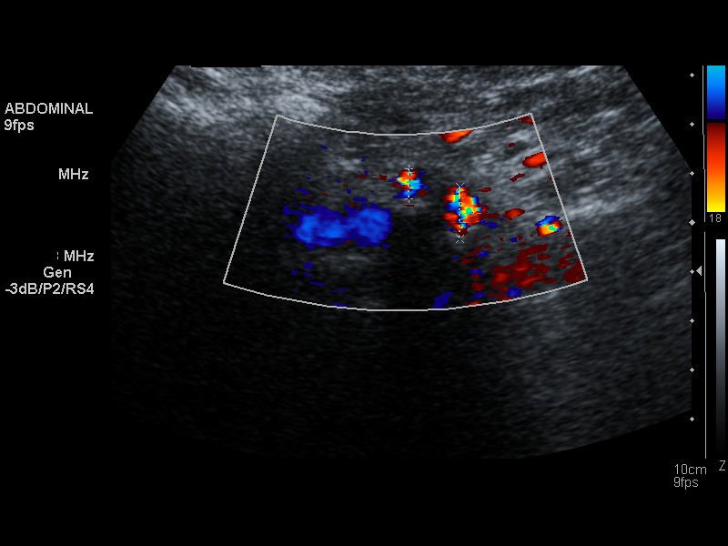
[im 11/18]
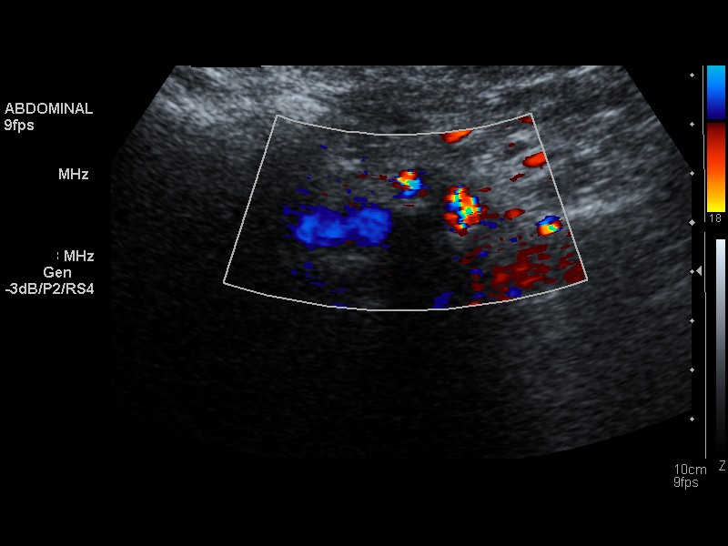
[im 13/18]
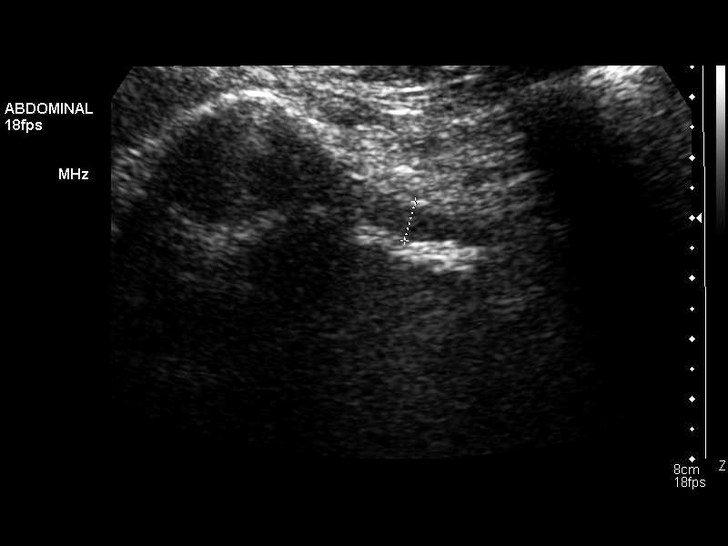
[im 14/18]
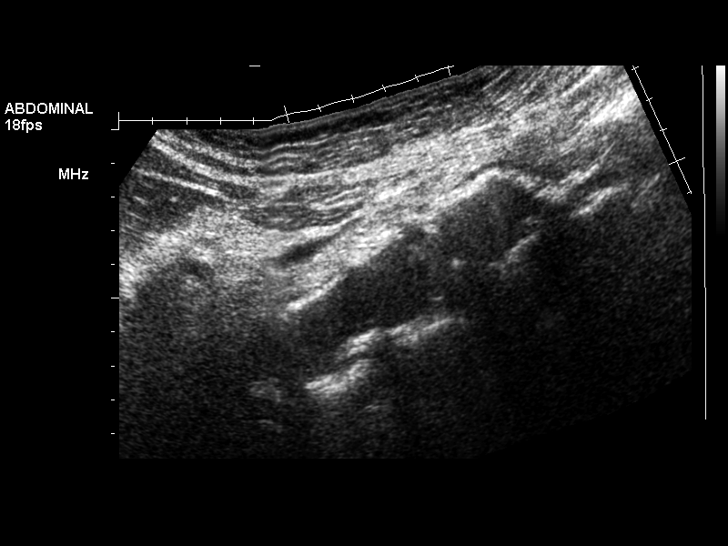
[im 15/18]
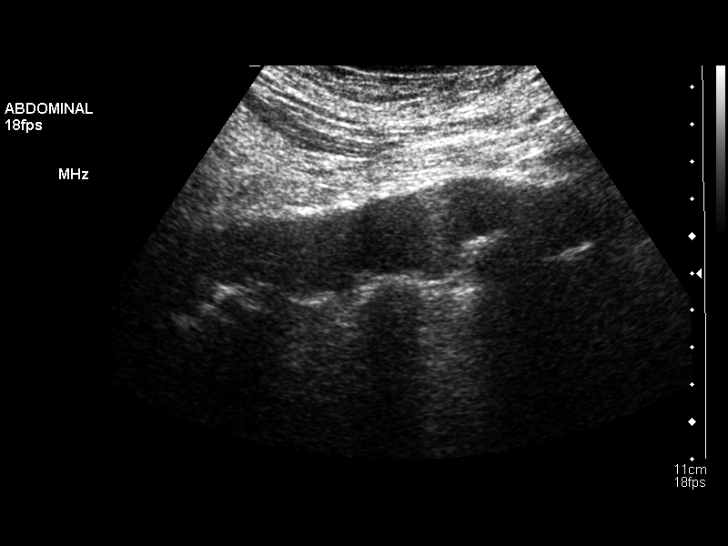
[im 17/18]
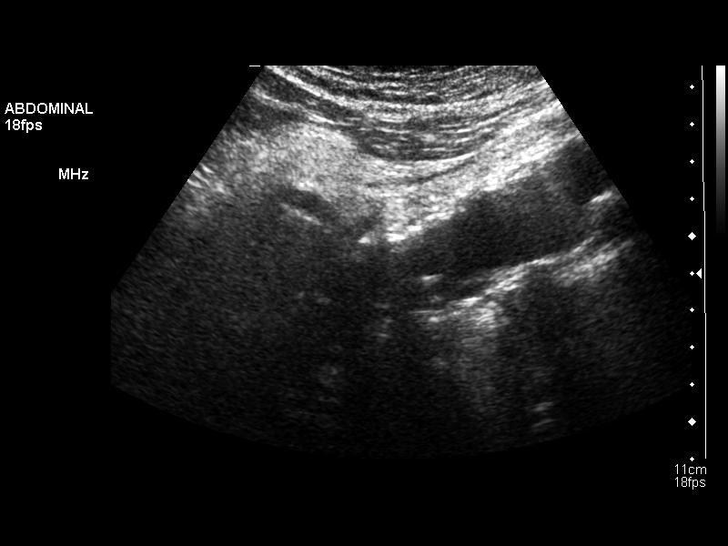
[im 18/18]
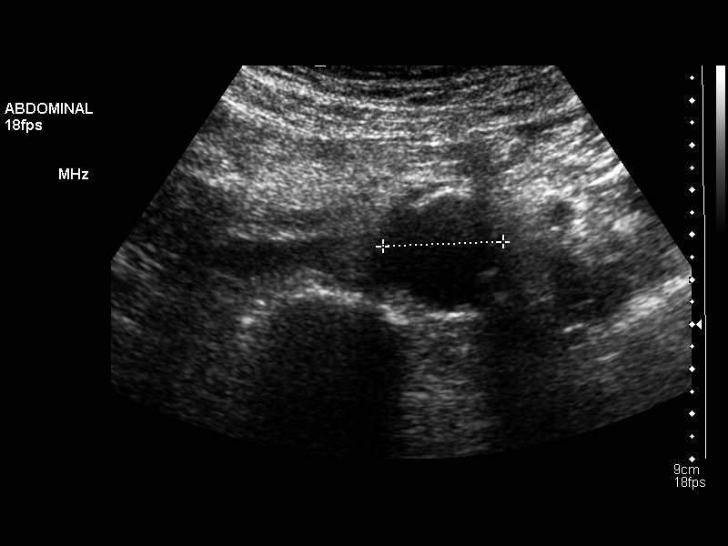

[14 of 18 positions shown; findings below may reference images not displayed]

FINDINGS: Abdominal Aorta:
              Maximum AP diameter:   2.7 cm

        Maximum TRV diameter:  2.9 cm

               Aneurysm Length:  Not applicable

Right Common Iliac Artery:  6 mm

Left Common Iliac Artery:  7 mm
IMPRESSION: There is some focal bulging and atheromatous change of the
abdominal aorta, but no focal aneurysm is evident.  Continued
follow-up is recommended however in view of the maximum diameter of
2.9 cm.

## 2012-12-14 NOTE — Telephone Encounter (Signed)
Refill sent with correct dose, also updated in pts EMR

## 2012-12-26 ENCOUNTER — Other Ambulatory Visit: Payer: Self-pay | Admitting: Internal Medicine

## 2012-12-26 NOTE — Telephone Encounter (Signed)
Ok to refill tessalon perles? Last OV 7.15.14 Last filled 7.7.14 #40 with no refills.

## 2012-12-27 NOTE — Telephone Encounter (Signed)
Refill done per order.

## 2012-12-27 NOTE — Telephone Encounter (Signed)
Ok 60 and 3 RF 

## 2012-12-28 ENCOUNTER — Ambulatory Visit: Payer: Medicare Other | Admitting: Internal Medicine

## 2013-01-08 ENCOUNTER — Ambulatory Visit (INDEPENDENT_AMBULATORY_CARE_PROVIDER_SITE_OTHER)
Admission: RE | Admit: 2013-01-08 | Discharge: 2013-01-08 | Disposition: A | Discharge status: Home / Self Care | Payer: Medicare Other | Source: Ambulatory Visit | Attending: Internal Medicine | Admitting: Internal Medicine

## 2013-01-08 ENCOUNTER — Telehealth: Payer: Self-pay | Admitting: *Deleted

## 2013-01-08 DIAGNOSIS — IMO0002 Reserved for concepts with insufficient information to code with codable children: Secondary | ICD-10-CM

## 2013-01-08 DIAGNOSIS — M899 Disorder of bone, unspecified: Secondary | ICD-10-CM

## 2013-01-08 NOTE — Telephone Encounter (Signed)
Received call from [facility] asking to change order for bone density screen to DG bone density vertebral fracture due to pt. Hx. Verbal order Dr. Drue Novel given ok to change. Orders enacted.

## 2013-01-29 ENCOUNTER — Ambulatory Visit (HOSPITAL_BASED_OUTPATIENT_CLINIC_OR_DEPARTMENT_OTHER)
Admission: RE | Admit: 2013-01-29 | Discharge: 2013-01-29 | Disposition: A | Discharge status: Home / Self Care | Payer: Medicare Other | Source: Ambulatory Visit | Attending: Internal Medicine | Admitting: Internal Medicine

## 2013-01-29 ENCOUNTER — Ambulatory Visit (INDEPENDENT_AMBULATORY_CARE_PROVIDER_SITE_OTHER): Payer: Medicare Other | Admitting: Internal Medicine

## 2013-01-29 ENCOUNTER — Encounter: Payer: Self-pay | Admitting: Internal Medicine

## 2013-01-29 VITALS — BP 100/60 | HR 73 | Temp 98.6°F | Wt 148.0 lb

## 2013-01-29 DIAGNOSIS — F29 Unspecified psychosis not due to a substance or known physiological condition: Secondary | ICD-10-CM

## 2013-01-29 DIAGNOSIS — R6251 Failure to thrive (child): Secondary | ICD-10-CM

## 2013-01-29 DIAGNOSIS — R634 Abnormal weight loss: Secondary | ICD-10-CM

## 2013-01-29 DIAGNOSIS — R410 Disorientation, unspecified: Secondary | ICD-10-CM

## 2013-01-29 DIAGNOSIS — R9389 Abnormal findings on diagnostic imaging of other specified body structures: Secondary | ICD-10-CM

## 2013-01-29 DIAGNOSIS — R5381 Other malaise: Secondary | ICD-10-CM | POA: Insufficient documentation

## 2013-01-29 DIAGNOSIS — I10 Essential (primary) hypertension: Secondary | ICD-10-CM

## 2013-01-29 DIAGNOSIS — J449 Chronic obstructive pulmonary disease, unspecified: Secondary | ICD-10-CM

## 2013-01-29 DIAGNOSIS — R918 Other nonspecific abnormal finding of lung field: Secondary | ICD-10-CM | POA: Insufficient documentation

## 2013-01-29 MED ORDER — PREDNISONE 2 MG PO TBEC: 2.0000 mg | DELAYED_RELEASE_TABLET | ORAL | Status: DC

## 2013-01-29 NOTE — Progress Notes (Addendum)
Subjective:    Patient ID: Antonio Hester, male    DOB: [birth year], 79 y.o.   MRN: [id number]  HPI Here with his son, they have several concerns Not sleeping well at night for at least a month, during the day he does feel sleepy. Weight loss despite good appetite, his weight dropped from 163 pounds to 148. For one week he has been slightly more confused than previously. Fatigue and weak for the last 2 days, more than baseline?  Past Medical History  Diagnosis Date  . CAD (coronary artery disease)   . Atrial fibrillation     s/p ablation 07/2007  . Heart block     complete, and pacemaker dependence s/p defib change 06/2008  . Cardiomyopathy     non ischemic  . COPD (chronic obstructive pulmonary disease)   . BPH (benign prostatic hyperplasia)   . Seizure     d/o (sees Dr.Love, last Ov 2006)  . Hyperlipemia   . Hypertension   . Abscess of lung(513.0)   . GERD (gastroesophageal reflux disease)     severe esophagitis per EGD 2007  . Osteopenia     per DEXA 10/2009  . Osteoarthritis   . Carotid artery occlusion     right ICA -occluded/ left ICA 60-79% stenosis  . Inguinal hernia    Past Surgical History  Procedure Laterality Date  . Pacemaker insertion      st jude  . Cardiac defibrillator placement  06/2008  . Hernia repair  10/05/2009    right inguinal hernia   History   Social History  . Marital Status: Married    Spouse Name: N/A    Number of Children: 2  . Years of Education: N/A   Occupational History  . retired    Social History Main Topics  . Smoking status: Former Smoker -- 2.00 packs/day for 50 years    Types: Cigarettes    Quit date: 01/04/1996  . Smokeless tobacco: Never Used  . Alcohol Use: 4.0 oz/week    8 drink(s) per week     Comment: wine sometimes  . Drug Use: No  . Sexual Activity: Not on file   Other Topics Concern  . Not on file   Social History Narrative   Lives at [facility], has an apartment, lives w/ wife, has a Nurse, mental health.   Son  brings food, has acces to the cafeteria   Son oversees meds      Review of Systems No cough, chest congestion or new respiratory symptoms, has chronic runny nose. No fever or chills per se No difficulty urinating, no change in the color of the urine. He had a fall yesterday, no head injuries, no headaches. Has a pressure ulcer, looks better according to the son      Objective:   Physical Exam  General -- alert, well-developed, elderly gentleman, looks chronically ill  but in no acute distress.   HEENT-- Not pale.   Lungs -- clear except for few ronchi, no crakles or wheezing Heart-- seems to be on regular rhythm .  Abdomen-- Not distended, good bowel sounds,soft, non-tender.  Extremities-- no pretibial edema bilaterally  Neurologic-- alert & does not know the day, month; knows who I'm, knows he is in the office and the purpose of the visit. Psych--  not anxious appearing and not depressed appearing.     Assessment & Plan:   Presents with several issues: -Fatigue, not a new complaint, on today's exam he seems to be at  baseline -Weight loss, documented weight loss since the last time he was here. -Confused for about a week more than baseline. -Difficulty sleeping for about a month.  Chart is reviewed. Last hemoglobin 2 months ago was low 10.2, last TSH normal, last chest x-ray show no mass. He started a prednisone ~ 11-2012  Insomnia may be due to to using steroids which were prescribed to help with his breathing. Unclear why she has weight loss, will check a TSH and a blood sugar. As far as increased confusion, this also could be from prednisone or a infection  Plan: Gradually decrease prednisone, see instructions CBC, TSH, BMP, UA, , UCX, chest x-ray Return to the office in 4 weeks  Addendum: Chest x-ray showed infiltrate versus a mass. Radiology recommends a CT with contrast however  creatinine is 1.5. Plan : avelox x 7 days, rx sent  CT chest w/o Will notify  son  Today , I spent more than 50 min with the patient, >50% of the time counseling, and coordinating his care

## 2013-01-29 NOTE — Patient Instructions (Addendum)
Stop prednisone 5 mg, start prednisone  2 mg: 4 tablets every day for 5 days, 3 tablets every day for 5 days, 2 tablets every day and stay on that dose ---- Get the XR at [facility] [place], corner of [address] (10 minutes form here); they are open 24/7 [address]  [place], [place] [address] [phone] ---- Come back in one month. ER if fever, chills, increased mental confusion, Headache.

## 2013-01-30 ENCOUNTER — Encounter: Payer: Self-pay | Admitting: Internal Medicine

## 2013-01-30 ENCOUNTER — Telehealth: Payer: Self-pay | Admitting: Internal Medicine

## 2013-01-30 LAB — BASIC METABOLIC PANEL
BUN: 30 mg/dL — ABNORMAL HIGH (ref 6–23)
CO2: 28 mEq/L (ref 19–32)
Calcium: 8.4 mg/dL (ref 8.4–10.5)
Chloride: 103 mEq/L (ref 96–112)
Creatinine, Ser: 1.5 mg/dL (ref 0.4–1.5)
Glucose, Bld: 102 mg/dL — ABNORMAL HIGH (ref 70–99)

## 2013-01-30 LAB — CBC WITH DIFFERENTIAL/PLATELET
Basophils Absolute: 0 10*3/uL (ref 0.0–0.1)
Basophils Relative: 0.8 % (ref 0.0–3.0)
Eosinophils Absolute: 0.3 10*3/uL (ref 0.0–0.7)
Lymphocytes Relative: 11.4 % — ABNORMAL LOW (ref 12.0–46.0)
MCHC: 32.7 g/dL (ref 30.0–36.0)
MCV: 90.3 fl (ref 78.0–100.0)
Monocytes Absolute: 0.5 10*3/uL (ref 0.1–1.0)
Neutrophils Relative %: 74.5 % (ref 43.0–77.0)
Platelets: 164 10*3/uL (ref 150.0–400.0)
RDW: 18.5 % — ABNORMAL HIGH (ref 11.5–14.6)

## 2013-01-30 MED ORDER — MOXIFLOXACIN HCL 400 MG PO TABS: 400.0000 mg | ORAL_TABLET | Freq: Every day | ORAL | Status: DC

## 2013-01-30 NOTE — Telephone Encounter (Signed)
Please call patient's son: Chest x-ray showed a question of pneumonia.  Plan : Start Avelox, I already  sent the prescription Needs a CT of the chest, I already entered the order

## 2013-01-30 NOTE — Telephone Encounter (Signed)
Left detailed message on son's voice mail explaining that pt needs CT of the chest. Also advised that Abx (Avelox) had already been sent in by Dr.Paz.

## 2013-01-31 ENCOUNTER — Ambulatory Visit: Payer: Medicare Other | Admitting: Internal Medicine

## 2013-01-31 ENCOUNTER — Ambulatory Visit (HOSPITAL_BASED_OUTPATIENT_CLINIC_OR_DEPARTMENT_OTHER)
Admission: RE | Admit: 2013-01-31 | Discharge: 2013-01-31 | Disposition: A | Discharge status: Home / Self Care | Payer: Medicare Other | Source: Ambulatory Visit | Attending: Internal Medicine | Admitting: Internal Medicine

## 2013-01-31 DIAGNOSIS — R634 Abnormal weight loss: Secondary | ICD-10-CM | POA: Insufficient documentation

## 2013-01-31 DIAGNOSIS — X58XXXA Exposure to other specified factors, initial encounter: Secondary | ICD-10-CM | POA: Insufficient documentation

## 2013-01-31 DIAGNOSIS — R918 Other nonspecific abnormal finding of lung field: Secondary | ICD-10-CM | POA: Insufficient documentation

## 2013-01-31 DIAGNOSIS — I2510 Atherosclerotic heart disease of native coronary artery without angina pectoris: Secondary | ICD-10-CM | POA: Insufficient documentation

## 2013-01-31 DIAGNOSIS — R9389 Abnormal findings on diagnostic imaging of other specified body structures: Secondary | ICD-10-CM

## 2013-01-31 DIAGNOSIS — S22009A Unspecified fracture of unspecified thoracic vertebra, initial encounter for closed fracture: Secondary | ICD-10-CM | POA: Insufficient documentation

## 2013-01-31 DIAGNOSIS — R5381 Other malaise: Secondary | ICD-10-CM | POA: Insufficient documentation

## 2013-01-31 DIAGNOSIS — J438 Other emphysema: Secondary | ICD-10-CM | POA: Insufficient documentation

## 2013-01-31 DIAGNOSIS — I517 Cardiomegaly: Secondary | ICD-10-CM | POA: Insufficient documentation

## 2013-02-01 ENCOUNTER — Encounter: Payer: Self-pay | Admitting: *Deleted

## 2013-02-01 ENCOUNTER — Telehealth: Payer: Self-pay | Admitting: *Deleted

## 2013-02-01 NOTE — Telephone Encounter (Signed)
Message copied by Baldwin Jamaica on Fri Feb 01, 2013  8:53 AM ------      Message from: Antonio Hester      Created: Wed Jan 23, 2013  1:44 PM       Please call patient:      Bone density test showed osteopenia, the right treatment is take calcium 1 g daily and vitamin D 600 units daily.      Medications such as Fosamax are optional, we can discuss those on return to the office ------

## 2013-02-01 NOTE — Telephone Encounter (Signed)
Pt was seen in the office on 01/29/13 and prescribed prednisone TBEC 2mg  with taper instructions.   Received call from [facility] pharmacy to verify that we indeed want pt to receive delayed release prednisone (prednisone TBEC) Please advise.

## 2013-02-01 NOTE — Telephone Encounter (Signed)
Left message on pts voice mail with bone density results and letter sent as well.

## 2013-02-04 ENCOUNTER — Encounter: Payer: Self-pay | Admitting: *Deleted

## 2013-02-04 ENCOUNTER — Telehealth: Payer: Self-pay | Admitting: *Deleted

## 2013-02-04 NOTE — Telephone Encounter (Signed)
Message copied by Eustace Quail on Mon Feb 04, 2013  9:13 AM ------      Message from: Wanda Plump      Created: Fri Feb 01, 2013  9:21 AM       Please call the patient's son:      This CT showed a number of findings, none of them are acute, we can discuss on return to the office ------

## 2013-02-04 NOTE — Telephone Encounter (Signed)
Pt notified. DJR  

## 2013-03-01 ENCOUNTER — Telehealth: Payer: Self-pay | Admitting: Internal Medicine

## 2013-03-01 NOTE — Telephone Encounter (Signed)
Patient's son is calling to let Dr. Drue Novel know that the patient is currently hospitalized at [facility] for "fluid and lung issues." He is expected to be discharged early-mid next week. His hospitalist there is a Dr. Erlene Senters. Just FYI. No call back requested.

## 2013-03-01 NOTE — Telephone Encounter (Signed)
Ok, thank you

## 2013-03-04 ENCOUNTER — Ambulatory Visit: Payer: Medicare Other | Admitting: Internal Medicine

## 2013-03-08 ENCOUNTER — Ambulatory Visit: Payer: Medicare Other | Admitting: Internal Medicine

## 2013-03-12 ENCOUNTER — Other Ambulatory Visit: Payer: Self-pay | Admitting: *Deleted

## 2013-03-12 MED ORDER — IPRATROPIUM-ALBUTEROL 20-100 MCG/ACT IN AERS: 1.0000 | INHALATION_SPRAY | Freq: Four times a day (QID) | RESPIRATORY_TRACT | Status: AC | PRN

## 2013-03-14 ENCOUNTER — Telehealth: Payer: Self-pay | Admitting: General Practice

## 2013-03-14 DIAGNOSIS — I10 Essential (primary) hypertension: Secondary | ICD-10-CM

## 2013-03-14 DIAGNOSIS — I4891 Unspecified atrial fibrillation: Secondary | ICD-10-CM

## 2013-03-14 DIAGNOSIS — I2510 Atherosclerotic heart disease of native coronary artery without angina pectoris: Secondary | ICD-10-CM

## 2013-03-14 MED ORDER — NEBIVOLOL HCL 5 MG PO TABS: 5.0000 mg | ORAL_TABLET | Freq: Every day | ORAL | Status: DC

## 2013-03-14 NOTE — Telephone Encounter (Signed)
Message on triage line: Pt son called stating that pt's cardiologist Dr. Almira Bar retired. Pt has been trying to get into [facility] with Dr. Bary Castilla, but is having some issues with their office. Pt son would like to know if we could either fill the bystolic due to it being almost out or place a referral to Dr. Bary Castilla ourselves. Please advise.

## 2013-03-14 NOTE — Telephone Encounter (Signed)
Med filled and referral placed. Pt notified.

## 2013-03-14 NOTE — Telephone Encounter (Signed)
Please refilled bystolic  for 3 months Also enter a referral for cardiology, Dr. Heron Nay

## 2013-03-15 ENCOUNTER — Encounter: Payer: Self-pay | Admitting: Internal Medicine

## 2013-03-15 ENCOUNTER — Ambulatory Visit (INDEPENDENT_AMBULATORY_CARE_PROVIDER_SITE_OTHER): Payer: Medicare Other | Admitting: Internal Medicine

## 2013-03-15 VITALS — BP 94/61 | HR 73 | Temp 97.8°F | Wt 145.2 lb

## 2013-03-15 DIAGNOSIS — J9610 Chronic respiratory failure, unspecified whether with hypoxia or hypercapnia: Secondary | ICD-10-CM

## 2013-03-15 DIAGNOSIS — R0902 Hypoxemia: Secondary | ICD-10-CM

## 2013-03-15 DIAGNOSIS — I10 Essential (primary) hypertension: Secondary | ICD-10-CM

## 2013-03-15 DIAGNOSIS — J9611 Chronic respiratory failure with hypoxia: Secondary | ICD-10-CM

## 2013-03-15 NOTE — Progress Notes (Signed)
  Subjective:    Patient ID: Antonio Hester, male    DOB: Dec 17, 1930, 79 y.o.   MRN: [id number]  HPI Hospital followup Was admitted to a hospital in [place], had shortness or breath. Available records reviewed: Creatinine ~ 1.3, 1.5. LFTs normal. Hemoglobin 11. BNP 4800, UA negative, chest x-ray question of pneumonia versus edema. The son reports he was prescribed IV steroids, antibiotics and discharged on a prednisone taper and Avelox which he already finished. Discharge summary not available at this time  Past Medical History  Diagnosis Date  . CAD (coronary artery disease)   . Atrial fibrillation     s/p ablation 07/2007  . Heart block     complete, and pacemaker dependence s/p defib change 06/2008  . Cardiomyopathy     non ischemic  . COPD (chronic obstructive pulmonary disease)   . BPH (benign prostatic hyperplasia)   . Seizure     d/o (sees Dr.Love, last Ov 2006)  . Hyperlipemia   . Hypertension   . Abscess of lung(513.0)   . GERD (gastroesophageal reflux disease)     severe esophagitis per EGD 2007  . Osteopenia     per DEXA 10/2009  . Osteoarthritis   . Carotid artery occlusion     right ICA -occluded/ left ICA 60-79% stenosis  . Inguinal hernia    Past Surgical History  Procedure Laterality Date  . Pacemaker insertion      st jude  . Cardiac defibrillator placement  06/2008  . Hernia repair  10/05/2009    right inguinal hernia   History   Social History  . Marital Status: Married    Spouse Name: N/A    Number of Children: 2  . Years of Education: N/A   Occupational History  . retired    Social History Main Topics  . Smoking status: Former Smoker -- 2.00 packs/day for 50 years    Types: Cigarettes    Quit date: 01/04/1996  . Smokeless tobacco: Never Used  . Alcohol Use: 4.0 oz/week    8 drink(s) per week     Comment: wine sometimes  . Drug Use: No  . Sexual Activity: Not on file   Other Topics Concern  . Not on file   Social History Narrative    Lives at [facility], has an apartment, lives w/ wife, has a Nurse, mental health.   Son brings food, has acces to the cafeteria   Son oversees meds      Review of Systems He felt very good after he left the hospital, now feeling okay. Had some difficult time sleeping while on prednisone but last night he rested well. No lower extremity edema. Bowel movements are normal. Has a place in the gum that is bothering him and like me to check. BP noted to be slightly low, he feels slt tired     Objective:   Physical Exam BP 94/61  Pulse 73  Temp(Src) 97.8 F (36.6 C)  Wt 145 lb 3.2 oz (65.862 kg)  BMI 22.74 kg/m2  SpO2 99% General -- alert, well-developed, NAD.  HEENT--  Lower gum with no obvious abscess or infection (recommend to see his dentist) Lungs --  clear today, and decreased breath sounds Heart-- seems regular   Extremities-- no pretibial edema bilaterally  Neurologic--  alert & oriented X3.  Psych--   No anxious appearing , no depressed appearing.     Assessment & Plan:

## 2013-03-15 NOTE — Patient Instructions (Addendum)
Decreased bystolic  5 mg to half tablet once a day.  Next visit in 4-5 weeks  for a a  follow up (30 minutes). No Fasting. Please make an appointment

## 2013-03-15 NOTE — Assessment & Plan Note (Signed)
Difficulty breathing, Admitted to an outside hospital with difficulty breathing, see history of present illness, treated with antibiotics, steroids and while @ the hospital IV Lasix. At this point he seems to be stable.

## 2013-03-15 NOTE — Assessment & Plan Note (Signed)
BPs seems slightly low, will decrease bystolic to half and observe

## 2013-03-18 ENCOUNTER — Ambulatory Visit (INDEPENDENT_AMBULATORY_CARE_PROVIDER_SITE_OTHER): Payer: Medicare Other | Admitting: Critical Care Medicine

## 2013-03-18 ENCOUNTER — Encounter: Payer: Self-pay | Admitting: Critical Care Medicine

## 2013-03-18 VITALS — BP 108/60 | HR 75 | Temp 97.5°F | Ht 70.0 in | Wt 143.0 lb

## 2013-03-18 DIAGNOSIS — J9610 Chronic respiratory failure, unspecified whether with hypoxia or hypercapnia: Secondary | ICD-10-CM

## 2013-03-18 DIAGNOSIS — R0902 Hypoxemia: Secondary | ICD-10-CM

## 2013-03-18 DIAGNOSIS — J438 Other emphysema: Secondary | ICD-10-CM

## 2013-03-18 DIAGNOSIS — J9611 Chronic respiratory failure with hypoxia: Secondary | ICD-10-CM

## 2013-03-18 NOTE — Patient Instructions (Signed)
No change in medications. Return in         4 months 

## 2013-03-18 NOTE — Progress Notes (Signed)
Subjective:    Patient ID: Antonio Hester, male    DOB: 1930-10-09, 79 y.o.   MRN: [id number]  HPI  79 y.o.WM copd   6/23/2014Pt notes more edema, cough, weakness, wheeze. Yellow mucus. Cannot walk more than 93ft on walker.  Saw PCP 2x in 6/14.  Pt then saw Crowell. Lasix dose changed Was 1 alt 2 qod.  Changed to 3/d x 3d. 6/2 - 6/17 then 2/d ?3/d and stay>>[facility]ell>>4/d tue/friday, then 2 dailyother days) (1/2 AM 1/2 3pm)  Using neb more often.  No difficulty with swallow. Throat is sore, notes heartburn several days per week. Pt lives in retirement apt, caregivers help during weekdays. Uses brovana /budesonide BID but falls asleep.     03/18/2013 Chief Complaint  Patient presents with  . 4 month follow up    Was in [facility] x 2 wks ago for fluid/breathing problems.  Breahting has improved since d/c.  Coughing less now - nonprod.  No wheezing, chest tightness, or chest pain.  3 weeks ago at [facility], fluid issues.  Pt hosp for two days, IV lasix/steroids.  Cough is less, no real chest pain.  No real edema.  Pt had been going up and down the prior 3 months     Past Medical History  Diagnosis Date  . CAD (coronary artery disease)   . Atrial fibrillation     s/p ablation 07/2007  . Heart block     complete, and pacemaker dependence s/p defib change 06/2008  . Cardiomyopathy     non ischemic  . COPD (chronic obstructive pulmonary disease)   . BPH (benign prostatic hyperplasia)   . Seizure     d/o (sees Dr.Love, last Ov 2006)  . Hyperlipemia   . Hypertension   . Abscess of lung(513.0)   . GERD (gastroesophageal reflux disease)     severe esophagitis per EGD 2007  . Osteopenia     per DEXA 10/2009  . Osteoarthritis   . Carotid artery occlusion     right ICA -occluded/ left ICA 60-79% stenosis  . Inguinal hernia      Family History  Problem Relation Age of Onset  . Heart disease Mother   . Cancer Mother   . Heart disease Father   . Heart attack Father      History    Social History  . Marital Status: Married    Spouse Name: N/A    Number of Children: 2  . Years of Education: N/A   Occupational History  . retired    Social History Main Topics  . Smoking status: Former Smoker -- 2.00 packs/day for 50 years    Types: Cigarettes    Quit date: 01/04/1996  . Smokeless tobacco: Never Used  . Alcohol Use: 4.0 oz/week    8 drink(s) per week     Comment: wine sometimes  . Drug Use: No  . Sexual Activity: Not on file   Other Topics Concern  . Not on file   Social History Narrative   Lives at [facility], has an apartment, lives w/ wife, has a Nurse, mental health.   Son brings food, has acces to the cafeteria   Son oversees meds      Allergies  Allergen Reactions  . Hydrocodone Other (See Comments)    Delusional.  . Penicillins     REACTION: rash     Outpatient Prescriptions Prior to Visit  Medication Sig Dispense Refill  . acetaminophen (TYLENOL) 500 MG tablet Take 1,000  mg by mouth at bedtime.       [name] arformoterol (BROVANA) 15 MCG/2ML NEBU Take 2 mLs (15 mcg total) by nebulization 2 (two) times daily.  120 mL  1  . aspirin 81 MG tablet Take 81 mg by mouth daily.       . budesonide (PULMICORT) 0.25 MG/2ML nebulizer solution Take 2 mLs (0.25 mg total) by nebulization 2 (two) times daily.  120 mL  1  . carbidopa-levodopa (SINEMET IR) 25-100 MG per tablet Take 1 tablet by mouth 3 (three) times daily.      . Cholecalciferol (VITAMIN D PO) Take 1 tablet by mouth daily.      . Cyanocobalamin (VITAMIN B-12 PO) Take 1 tablet by mouth daily.      [name] Dextromethorphan-Guaifenesin 20-200 MG/15ML SYRP Take by mouth every morning.      . Ipratropium-Albuterol (COMBIVENT RESPIMAT) 20-100 MCG/ACT AERS respimat Inhale 1 puff into the lungs 4 (four) times daily as needed.  1 Inhaler  5  . loratadine (CLARITIN) 10 MG tablet Take 15 mg by mouth daily.       . Multiple Vitamin (MULTIVITAMIN) tablet Take 1 tablet by mouth daily.      . nebivolol (BYSTOLIC) 5 MG tablet  Take 2.5 mg by mouth daily.      . phenytoin (DILANTIN) 100 MG ER capsule       . Polyethyl Glycol-Propyl Glycol (SYSTANE OP) Apply 1 drop to eye 4 (four) times daily as needed.       . simvastatin (ZOCOR) 40 MG tablet 20 mg.       . celecoxib (CELEBREX) 100 MG capsule Take 1 capsule (100 mg total) by mouth 2 (two) times daily as needed.  180 capsule  1  . furosemide (LASIX) 20 MG tablet Take 1 tablet (20 mg total) by mouth 2 (two) times daily. Take 1 tablet in the morning and 1 tablet in the evening. On Tues and Fri increase to 2 tabs in the am and 2 tabs in the pm  60 tablet  3  . benzonatate (TESSALON) 100 MG capsule TAKE 1 CAPSULE BY MOUTH 3 TIMES DAILY AS NEEDED FOR COUGH.  60 capsule  3   No facility-administered medications prior to visit.     Review of Systems  Constitutional:   No  weight loss, night sweats,  Fevers, chills, fatigue, lassitude. HEENT:   No headaches,  Difficulty swallowing,  Tooth/dental problems,  Sore throat,                No sneezing, itching, ear ache, nasal congestion, post nasal drip,   CV:  No chest pain,  Orthopnea, PND, swelling in lower extremities, anasarca, dizziness, palpitations  GI  No heartburn, indigestion, abdominal pain, nausea, vomiting, diarrhea, change in bowel habits, loss of appetite  Resp: Notes  shortness of breath with exertion and  at rest.  No excess mucus, no productive cough,  Notes  non-productive cough,  No coughing up of blood.  No change in color of mucus.  No wheezing.  No chest wall deformity  Skin: no rash or lesions.  GU: no dysuria, change in color of urine, no urgency or frequency.  No flank pain.  MS:  No joint pain or swelling.  No decreased range of motion.  No back pain.  Psych:  No change in mood or affect. No depression or anxiety.  No memory loss.     Objective:   Physical Exam  Filed Vitals:   03/18/13 1500  BP: 108/60  Pulse: 75  Temp: 97.5 F (36.4 C)  TempSrc: Oral  Height: 5\' 10"  (1.778 m)   Weight: 143 lb (64.864 kg)  SpO2: 98%    Gen: Pleasant, well-nourished, in no distress,  normal affect  ENT: No lesions,  mouth clear,  oropharynx clear, no postnasal drip  Neck: No JVD, no TMG, no carotid bruits  Lungs: No use of accessory muscles, no dullness to percussion, distant bs  Cardiovascular: RRR, heart sounds normal, no murmur or gallops, no peripheral edema  Abdomen: soft and NT, no HSM,  BS normal  Musculoskeletal: No deformities, no cyanosis or clubbing, tender R pelvis area , no deformitiy  Neuro: alert, non focal  Skin: Warm, no lesions or rashes         Assessment & Plan:   No problem-specific assessment & plan notes found for this encounter.   Updated Medication List Outpatient Encounter Prescriptions as of 03/18/2013  Medication Sig Dispense Refill  . acetaminophen (TYLENOL) 500 MG tablet Take 1,000 mg by mouth at bedtime.       [name] arformoterol (BROVANA) 15 MCG/2ML NEBU Take 2 mLs (15 mcg total) by nebulization 2 (two) times daily.  120 mL  1  . aspirin 81 MG tablet Take 81 mg by mouth daily.       . budesonide (PULMICORT) 0.25 MG/2ML nebulizer solution Take 2 mLs (0.25 mg total) by nebulization 2 (two) times daily.  120 mL  1  . calcium-vitamin D (OSCAL) 250-125 MG-UNIT per tablet Take 1 tablet by mouth daily.      . carbidopa-levodopa (SINEMET IR) 25-100 MG per tablet Take 1 tablet by mouth 3 (three) times daily.      . celecoxib (CELEBREX) 100 MG capsule Take 100 mg by mouth daily.      . Cholecalciferol (VITAMIN D PO) Take 1 tablet by mouth daily.      . Cyanocobalamin (VITAMIN B-12 PO) Take 1 tablet by mouth daily.      [name] Dextromethorphan-Guaifenesin 20-200 MG/15ML SYRP Take by mouth every morning.      . furosemide (LASIX) 20 MG tablet Take 20 mg by mouth 2 (two) times daily. Take 1 tablet in the morning and 1 tablet in the evening. On Tues increase to 2 tabs in the am and 2 tabs in the pm      . Ipratropium-Albuterol (COMBIVENT RESPIMAT)  20-100 MCG/ACT AERS respimat Inhale 1 puff into the lungs 4 (four) times daily as needed.  1 Inhaler  5  . loratadine (CLARITIN) 10 MG tablet Take 15 mg by mouth daily.       . Multiple Vitamin (MULTIVITAMIN) tablet Take 1 tablet by mouth daily.      . nebivolol (BYSTOLIC) 5 MG tablet Take 2.5 mg by mouth daily.      . phenytoin (DILANTIN) 100 MG ER capsule       . Polyethyl Glycol-Propyl Glycol (SYSTANE OP) Apply 1 drop to eye 4 (four) times daily as needed.       . simvastatin (ZOCOR) 40 MG tablet 20 mg.       . [DISCONTINUED] celecoxib (CELEBREX) 100 MG capsule Take 1 capsule (100 mg total) by mouth 2 (two) times daily as needed.  180 capsule  1  . [DISCONTINUED] furosemide (LASIX) 20 MG tablet Take 1 tablet (20 mg total) by mouth 2 (two) times daily. Take 1 tablet in the morning and 1 tablet in the evening. On Tues and Fri increase to 2 tabs in the  am and 2 tabs in the pm  60 tablet  3  . benzonatate (TESSALON) 100 MG capsule TAKE 1 CAPSULE BY MOUTH 3 TIMES DAILY AS NEEDED FOR COUGH.  60 capsule  3   No facility-administered encounter medications on file as of 03/18/2013.

## 2013-03-19 NOTE — Assessment & Plan Note (Signed)
Gold stage D. COPD oxygen dependent stable at this time Plan Maintain oxygen therapy Maintain nebulized therapy as prescribed Return 4 months

## 2013-03-19 NOTE — Assessment & Plan Note (Signed)
Chronic respiratory failure oxygen therapy Maintain current oxygen therapy

## 2013-04-05 ENCOUNTER — Telehealth: Payer: Self-pay | Admitting: Internal Medicine

## 2013-04-05 ENCOUNTER — Other Ambulatory Visit: Payer: Self-pay | Admitting: *Deleted

## 2013-04-05 DIAGNOSIS — IMO0001 Reserved for inherently not codable concepts without codable children: Secondary | ICD-10-CM

## 2013-04-05 NOTE — Telephone Encounter (Signed)
Rodney Booze with [employer] [employer] is calling to request orders for speech therapy for the patient. States that these orders can be faxed to [phone]. Please advise. Rodney Booze is in the rehab department at [facility] and can be reached at ph# [phone] with questions.

## 2013-04-05 NOTE — Telephone Encounter (Signed)
Order faxed.

## 2013-04-05 NOTE — Telephone Encounter (Signed)
Please do the referral

## 2013-04-05 NOTE — Telephone Encounter (Signed)
Please advise 

## 2013-04-12 ENCOUNTER — Encounter: Payer: Self-pay | Admitting: Internal Medicine

## 2013-04-12 ENCOUNTER — Ambulatory Visit (INDEPENDENT_AMBULATORY_CARE_PROVIDER_SITE_OTHER): Payer: Medicare Other | Admitting: Internal Medicine

## 2013-04-12 VITALS — BP 112/69 | HR 75 | Temp 98.0°F | Resp 20 | Wt 145.0 lb

## 2013-04-12 DIAGNOSIS — K5900 Constipation, unspecified: Secondary | ICD-10-CM | POA: Insufficient documentation

## 2013-04-12 DIAGNOSIS — R6251 Failure to thrive (child): Secondary | ICD-10-CM

## 2013-04-12 DIAGNOSIS — M899 Disorder of bone, unspecified: Secondary | ICD-10-CM

## 2013-04-12 DIAGNOSIS — I10 Essential (primary) hypertension: Secondary | ICD-10-CM

## 2013-04-12 NOTE — Assessment & Plan Note (Addendum)
Last bone density test 5/204---> osteopenia. He is taking calcium. CT chest  Done for a different issue show a vertebral compression fracture. Heis 82, his overall status is poor, he has occasional dysphagia, thus I am reluctant to introduce a new prescription for osteoporosis treatment. Nevertheless I gave him a list of options to think about it; prolia may be his best option

## 2013-04-12 NOTE — Progress Notes (Signed)
  Subjective:    Patient ID: Antonio Hester, male    DOB: Nov 28, 1930, 79 y.o.   MRN: [id number]  HPI Followup visit, here with his wife and a caregiver. Since the last time he was here he is feeling good, he is doing some physical therapy and feels that that is helping. We decrease bystolic, his blood pressure today is very good Reports that he has been having constipation for the last couple of weeks, the last bowel movement was about 3 days ago with small amounts of   stools. Med list reviewed, good compliance.  Past Medical History  Diagnosis Date  . CAD (coronary artery disease)   . Atrial fibrillation     s/p ablation 07/2007  . Heart block     complete, and pacemaker dependence s/p defib change 06/2008  . Cardiomyopathy     non ischemic  . COPD (chronic obstructive pulmonary disease)   . BPH (benign prostatic hyperplasia)   . Seizure     d/o (sees Dr.Love, last Ov 2006)  . Hyperlipemia   . Hypertension   . Abscess of lung(513.0)   . GERD (gastroesophageal reflux disease)     severe esophagitis per EGD 2007  . Osteopenia     per DEXA 10/2009  . Osteoarthritis   . Carotid artery occlusion     right ICA -occluded/ left ICA 60-79% stenosis  . Inguinal hernia    Past Surgical History  Procedure Laterality Date  . Pacemaker insertion      st jude  . Cardiac defibrillator placement  06/2008  . Hernia repair  10/05/2009    right inguinal hernia   History   Social History  . Marital Status: Married    Spouse Name: N/A    Number of Children: 2  . Years of Education: N/A   Occupational History  . retired    Social History Main Topics  . Smoking status: Former Smoker -- 2.00 packs/day for 50 years    Types: Cigarettes    Quit date: 01/04/1996  . Smokeless tobacco: Never Used  . Alcohol Use: 4.0 oz/week    8 drink(s) per week     Comment: wine sometimes  . Drug Use: No  . Sexual Activity: Not on file   Other Topics Concern  . Not on file   Social History  Narrative   Lives at [facility], has an apartment, lives w/ wife, has a Nurse, mental health.   Son brings food, has acces to the cafeteria   Son oversees meds     Review of Systems Denies fever or chills No nausea, abdominal pain, vomiting. No blood per rectum. Appetite is fair.     Objective:   Physical Exam BP 112/69  Pulse 75  Temp(Src) 98 F (36.7 C) (Tympanic)  Resp 20  Wt 145 lb (65.772 kg)  SpO2 94% General -- alert, well-developed, No distress, he looks very well today..  Lungs -- Decreased breath sounds otherwise normal Abdomen-- Exam performed with the patient is seated, not distended, good bowel sounds,soft, non-tender.  Extremities-- no pretibial edema bilaterally  Neurologic--  alert & oriented X3.   Psych-- Cognition and judgment appear intact. Cooperative with normal attention span and concentration. No anxious appearing , no depressed appearing.       Assessment & Plan:

## 2013-04-12 NOTE — Assessment & Plan Note (Signed)
bistolic was decrease in the last office visit, BP today is very good. No change

## 2013-04-12 NOTE — Patient Instructions (Signed)
For  constipation take MiraLax 17 g every day with lots of fluids. If the constipation continue, you have fever, stomach pain, nausea: Please let us know.  You have osteoporosis, there are therapies available, treatment including Reclast injection, Prolia, Forteo  Next visit in 2-3 months for a   follow up (30 minutes) No Fasting Please make an appointment

## 2013-04-12 NOTE — Assessment & Plan Note (Signed)
Seems stable at this point.   

## 2013-04-12 NOTE — Assessment & Plan Note (Addendum)
Complains of constipation lately with no red flag symptoms. Plan: MiraLax. See instructions

## 2013-04-13 ENCOUNTER — Encounter: Payer: Self-pay | Admitting: Internal Medicine

## 2013-04-23 ENCOUNTER — Other Ambulatory Visit: Payer: Self-pay | Admitting: *Deleted

## 2013-04-23 ENCOUNTER — Telehealth: Payer: Self-pay | Admitting: *Deleted

## 2013-04-23 ENCOUNTER — Inpatient Hospital Stay
Admission: RE | Admit: 2013-04-23 | Discharge: 2013-04-23 | Disposition: A | Discharge status: Home / Self Care | Payer: Self-pay | Source: Ambulatory Visit | Attending: Internal Medicine | Admitting: Internal Medicine

## 2013-04-23 DIAGNOSIS — T17800A Unspecified foreign body in other parts of respiratory tract causing asphyxiation, initial encounter: Secondary | ICD-10-CM

## 2013-04-23 DIAGNOSIS — R05 Cough: Secondary | ICD-10-CM

## 2013-04-23 DIAGNOSIS — J069 Acute upper respiratory infection, unspecified: Secondary | ICD-10-CM

## 2013-04-23 NOTE — Telephone Encounter (Signed)
Order faxed to Lorrie at [facility]

## 2013-04-23 NOTE — Telephone Encounter (Signed)
Received a phone from patients speech therapist requesting an order for a modified barium swallow study to r/o aspiration from his recent URI. Please advise.

## 2013-04-23 NOTE — Telephone Encounter (Signed)
Ok to enter the other

## 2013-05-08 ENCOUNTER — Ambulatory Visit (INDEPENDENT_AMBULATORY_CARE_PROVIDER_SITE_OTHER): Payer: Medicare Other | Admitting: Cardiology

## 2013-05-08 ENCOUNTER — Encounter: Payer: Self-pay | Admitting: Cardiology

## 2013-05-08 VITALS — BP 115/70 | HR 77 | Wt 148.0 lb

## 2013-05-08 DIAGNOSIS — Z950 Presence of cardiac pacemaker: Secondary | ICD-10-CM

## 2013-05-08 DIAGNOSIS — I420 Dilated cardiomyopathy: Secondary | ICD-10-CM

## 2013-05-08 DIAGNOSIS — I4891 Unspecified atrial fibrillation: Secondary | ICD-10-CM

## 2013-05-08 DIAGNOSIS — I5022 Chronic systolic (congestive) heart failure: Secondary | ICD-10-CM | POA: Insufficient documentation

## 2013-05-08 DIAGNOSIS — I428 Other cardiomyopathies: Secondary | ICD-10-CM

## 2013-05-08 NOTE — Patient Instructions (Signed)
Your physician recommends that you schedule a follow-up appointment in: 3 MONTHS WITH DR CRENSHAW  

## 2013-05-08 NOTE — Progress Notes (Signed)
HPI: 79 year old male for evaluation of cardiomyopathy. Patient has had previous care in [facility]. Not all records available. He apparently has a history of nonischemic cardiomyopathy. Echo in 2009 showed an ejection fraction of 20-25%, biatrial enlargement, right ventricular enlargement, mild to moderate mitral regurgitation, moderately severe tricuspid regurgitation and small ASD. No history of coronary disease by report. He does have a history of atrial fibrillation ablation. Coumadin stopped previously because of recurrent falls. Carotid Dopplers in February 2014 showed an occluded right carotid and 0-39% left stenosis. Patient denies dyspnea on exertion, orthopnea, PND, pedal edema, chest pain or syncope. He is frail and on Home O2 for COPD.  Current Outpatient Prescriptions  Medication Sig Dispense Refill  . acetaminophen (TYLENOL) 500 MG tablet Take 1,000 mg by mouth at bedtime.       [name] arformoterol (BROVANA) 15 MCG/2ML NEBU Take 2 mLs (15 mcg total) by nebulization 2 (two) times daily.  120 mL  1  . aspirin 81 MG tablet Take 81 mg by mouth daily.       . benzonatate (TESSALON) 100 MG capsule TAKE 1 CAPSULE BY MOUTH 3 TIMES DAILY AS NEEDED FOR COUGH.  60 capsule  3  . budesonide (PULMICORT) 0.25 MG/2ML nebulizer solution Take 2 mLs (0.25 mg total) by nebulization 2 (two) times daily.  120 mL  1  . calcium-vitamin D (OSCAL) 250-125 MG-UNIT per tablet Take 1 tablet by mouth daily.      . carbidopa-levodopa (SINEMET IR) 25-100 MG per tablet Take 1 tablet by mouth 3 (three) times daily.      . celecoxib (CELEBREX) 100 MG capsule Take 100 mg by mouth daily.      . Cholecalciferol (VITAMIN D PO) Take 1 tablet by mouth daily.      . Cyanocobalamin (VITAMIN B-12 PO) Take 1 tablet by mouth daily.      [name] Dextromethorphan-Guaifenesin 20-200 MG/15ML SYRP Take by mouth every morning.      . furosemide (LASIX) 20 MG tablet Take 20 mg by mouth 2 (two) times daily. Take 1 tablet in the morning and 1  tablet in the evening. On Tues increase to 2 tabs in the am and 2 tabs in the pm      . Ipratropium-Albuterol (COMBIVENT RESPIMAT) 20-100 MCG/ACT AERS respimat Inhale 1 puff into the lungs 4 (four) times daily as needed.  1 Inhaler  5  . loratadine (CLARITIN) 10 MG tablet Take 15 mg by mouth daily.       . Multiple Vitamin (MULTIVITAMIN) tablet Take 1 tablet by mouth daily.      . nebivolol (BYSTOLIC) 5 MG tablet Take 2.5 mg by mouth daily.      . phenytoin (DILANTIN) 100 MG ER capsule       . Polyethyl Glycol-Propyl Glycol (SYSTANE OP) Apply 1 drop to eye 4 (four) times daily as needed.       . polyethylene glycol (MIRALAX / GLYCOLAX) packet Take 17 g by mouth daily.      . simvastatin (ZOCOR) 40 MG tablet 20 mg.        No current facility-administered medications for this visit.    Allergies  Allergen Reactions  . Hydrocodone Other (See Comments)    Delusional.  . Penicillins     REACTION: rash    Past Medical History  Diagnosis Date  . CAD (coronary artery disease)   . Atrial fibrillation     s/p ablation 07/2007  . Heart block  complete, and pacemaker dependence s/p defib change 06/2008  . Cardiomyopathy     non ischemic  . COPD (chronic obstructive pulmonary disease)   . BPH (benign prostatic hyperplasia)   . Seizure     d/o (sees Dr.Love, last Ov 2006)  . Hyperlipemia   . Hypertension   . Abscess of lung(513.0)   . GERD (gastroesophageal reflux disease)     severe esophagitis per EGD 2007  . Osteopenia     per DEXA 10/2009  . Osteoarthritis   . Carotid artery occlusion     right ICA -occluded/ left ICA 60-79% stenosis  . Inguinal hernia   . Renal insufficiency     Past Surgical History  Procedure Laterality Date  . Pacemaker insertion      st jude  . Cardiac defibrillator placement  06/2008  . Hernia repair  10/05/2009    right inguinal hernia    History   Social History  . Marital Status: Married    Spouse Name: N/A    Number of Children: 2  .  Years of Education: N/A   Occupational History  . retired    Social History Main Topics  . Smoking status: Former Smoker -- 2.00 packs/day for 50 years    Types: Cigarettes    Quit date: 01/04/1996  . Smokeless tobacco: Never Used  . Alcohol Use: 4.0 oz/week    8 drink(s) per week     Comment: wine sometimes  . Drug Use: No  . Sexual Activity: Not on file   Other Topics Concern  . Not on file   Social History Narrative   Lives at [facility], has an apartment, lives w/ wife, has a Nurse, mental health.   Son brings food, has acces to the cafeteria   Son oversees meds     Family History  Problem Relation Age of Onset  . Heart disease Mother   . Cancer Mother   . Heart disease Father   . Heart attack Father     ROS: no fevers or chills, productive cough, hemoptysis, dysphasia, odynophagia, melena, hematochezia, dysuria, hematuria, rash, seizure activity, orthopnea, PND, pedal edema, claudication. Remaining systems are negative.  Physical Exam:   Blood pressure 115/70, pulse 77, weight 148 lb (67.132 kg).  General:  Well developed/frail in NAD Skin warm/dry Patient not depressed No peripheral clubbing Back-normal HEENT-normal/normal eyelids Neck supple/normal carotid upstroke bilaterally; no bruits; no JVD; no thyromegaly chest - CTA/ normal expansion CV - RRR/normal S1 and S2; no rubs or gallops;  PMI nondisplaced, 2/6 systolic murmur apex Abdomen -NT/ND, no HSM, no mass, + bowel sounds, no bruit 2+ femoral pulses, no bruits Ext-no edema, chords, diminished distal pulses Neuro-Some Parkinson's features  ECG probable sinus rhythm with ventricular pacing and occasional PVC

## 2013-05-08 NOTE — Assessment & Plan Note (Signed)
Referred to electrophysiology for evaluation.

## 2013-05-08 NOTE — Assessment & Plan Note (Signed)
We will obtain records from [facility] concerning previous echocardiograms and recent admission. Continue beta blocker. He is not on an ACE inhibitor and I will review records before initiating. He does have renal insufficiency.

## 2013-05-08 NOTE — Assessment & Plan Note (Signed)
Patient has a history of atrial fibrillation as well as atrial fibrillation ablation. He has had previous CVAs and would certainly benefit from long-term anticoagulation. However he has frequent falls and anticoagulation was discontinued several months ago for this which I think is appropriate. Continue beta blocker.

## 2013-05-08 NOTE — Assessment & Plan Note (Signed)
Continue present dose of Lasix. He is euvolemic on examination.

## 2013-05-13 ENCOUNTER — Telehealth: Payer: Self-pay | Admitting: Cardiology

## 2013-05-13 NOTE — Telephone Encounter (Signed)
Records rec from [facility] gave to [facility]

## 2013-05-13 NOTE — Telephone Encounter (Signed)
ROI faxed to [facility] at [phone]

## 2013-06-28 ENCOUNTER — Encounter: Payer: Self-pay | Admitting: Family

## 2013-07-01 ENCOUNTER — Encounter: Payer: Self-pay | Admitting: Family

## 2013-07-01 ENCOUNTER — Ambulatory Visit (HOSPITAL_COMMUNITY)
Admission: RE | Admit: 2013-07-01 | Discharge: 2013-07-01 | Disposition: A | Discharge status: Home / Self Care | Payer: Medicare Other | Source: Ambulatory Visit | Attending: Family | Admitting: Family

## 2013-07-01 ENCOUNTER — Ambulatory Visit (INDEPENDENT_AMBULATORY_CARE_PROVIDER_SITE_OTHER): Payer: Medicare Other | Admitting: Family

## 2013-07-01 VITALS — BP 132/80 | HR 74 | Resp 16 | Ht 67.0 in | Wt 142.0 lb

## 2013-07-01 DIAGNOSIS — I658 Occlusion and stenosis of other precerebral arteries: Secondary | ICD-10-CM | POA: Insufficient documentation

## 2013-07-01 DIAGNOSIS — I6529 Occlusion and stenosis of unspecified carotid artery: Secondary | ICD-10-CM

## 2013-07-01 DIAGNOSIS — Z48812 Encounter for surgical aftercare following surgery on the circulatory system: Secondary | ICD-10-CM

## 2013-07-01 NOTE — Patient Instructions (Signed)

## 2013-07-01 NOTE — Progress Notes (Signed)
Established Carotid Patient   History of Present Illness  Antonio Hester is a 80 y.o. male patient of Dr. Donnetta Hutching followed for known carotid stenosis. The patient has a known right ICA occlusion He returns today for follow up. Son is asking about, after speaking with patient's neurologist, Dr. Everette Rank, and ophthalmologist son gets the impression that his eyesight may be affected by lack of circulation to his eyes. He had a history of stroke about 20 years ago; as manifested by syncope, no lateralizing symptoms, no stroke symptoms since then, of which he is aware. Using portable O2, 3L/min Has double vision in the last 2-3 years. He denies residual neurological effects. He was hospitalized last October for dyspnea, COPD, exacerbation of CHF, at [facility]. Has a Investment banker, corporate. Patient has not had previous carotid artery intervention.  Pt Diabetic: No Pt smoker: former smoker, quit at age 24  Pt meds include: Statin : Yes ASA: Yes Other anticoagulants/antiplatelets: stopped coumadin, stopped Jennye Moccasin due to falls, then to daily 81 mg ASA.   Past Medical History  Diagnosis Date  . CAD (coronary artery disease)   . Atrial fibrillation     s/p ablation 07/2007  . Heart block     complete, and pacemaker dependence s/p defib change 06/2008  . Cardiomyopathy     non ischemic  . COPD (chronic obstructive pulmonary disease)   . BPH (benign prostatic hyperplasia)   . Seizure     d/o (sees Dr.Love, last Ov 2006)  . Hyperlipemia   . Hypertension   . Abscess of lung(513.0)   . GERD (gastroesophageal reflux disease)     severe esophagitis per EGD 2007  . Osteopenia     per DEXA 10/2009  . Osteoarthritis   . Carotid artery occlusion     right ICA -occluded/ left ICA 60-79% stenosis  . Inguinal hernia   . Renal insufficiency   . CHF (congestive heart failure)   . Stroke     several years ago  . Cancer     pre-Cancer lesion-neck    Social History History  Substance Use  Topics  . Smoking status: Former Smoker -- 2.00 packs/day for 50 years    Types: Cigarettes    Quit date: 01/04/1996  . Smokeless tobacco: Never Used  . Alcohol Use: 4.0 oz/week    8 drink(s) per week     Comment: wine sometimes    Family History Family History  Problem Relation Age of Onset  . Heart disease Mother   . Cancer Mother   . Heart disease Father     Heart Disease before age 4  . Heart attack Father   . Cancer Brother   . Hyperlipidemia Son   . Hypertension Son     Surgical History Past Surgical History  Procedure Laterality Date  . Pacemaker insertion      st jude  . Cardiac defibrillator placement  06/2008  . Hernia repair  10/05/2009    right inguinal hernia    Allergies  Allergen Reactions  . Hydrocodone Other (See Comments)    Delusional.  . Penicillins Rash    REACTION: rash    Current Outpatient Prescriptions  Medication Sig Dispense Refill  . acetaminophen (TYLENOL) 500 MG tablet Take 1,000 mg by mouth at bedtime.       [name] arformoterol (BROVANA) 15 MCG/2ML NEBU Take 2 mLs (15 mcg total) by nebulization 2 (two) times daily.  120 mL  1  . aspirin 81 MG tablet Take 81 mg  by mouth daily.       . benzonatate (TESSALON) 100 MG capsule TAKE 1 CAPSULE BY MOUTH 3 TIMES DAILY AS NEEDED FOR COUGH.  60 capsule  3  . budesonide (PULMICORT) 0.25 MG/2ML nebulizer solution Take 2 mLs (0.25 mg total) by nebulization 2 (two) times daily.  120 mL  1  . calcium-vitamin D (OSCAL) 250-125 MG-UNIT per tablet Take 1 tablet by mouth daily.      . carbidopa-levodopa (SINEMET IR) 25-100 MG per tablet Take 1 tablet by mouth 3 (three) times daily.      . celecoxib (CELEBREX) 100 MG capsule Take 100 mg by mouth daily.      . Cholecalciferol (VITAMIN D PO) Take 1 tablet by mouth daily.      . Cyanocobalamin (VITAMIN B-12 PO) Take 1 tablet by mouth daily.      [name] Dextromethorphan-Guaifenesin 20-200 MG/15ML SYRP Take by mouth every morning.      . furosemide (LASIX) 20 MG tablet  Take 20 mg by mouth 2 (two) times daily. Take 1 tablet in the morning and 1 tablet in the evening. On Tues increase to 2 tabs in the am and 2 tabs in the pm      . Ipratropium-Albuterol (COMBIVENT RESPIMAT) 20-100 MCG/ACT AERS respimat Inhale 1 puff into the lungs 4 (four) times daily as needed.  1 Inhaler  5  . loratadine (CLARITIN) 10 MG tablet Take 15 mg by mouth daily.       . Multiple Vitamin (MULTIVITAMIN) tablet Take 1 tablet by mouth daily.      . nebivolol (BYSTOLIC) 5 MG tablet Take 2.5 mg by mouth daily.      . phenytoin (DILANTIN) 100 MG ER capsule       . Polyethyl Glycol-Propyl Glycol (SYSTANE OP) Apply 1 drop to eye 4 (four) times daily as needed.       . polyethylene glycol (MIRALAX / GLYCOLAX) packet Take 17 g by mouth daily.      . simvastatin (ZOCOR) 40 MG tablet 20 mg daily.        No current facility-administered medications for this visit.    Review of Systems : See HPI for pertinent positives and negatives.  Physical Examination  Filed Vitals:   07/01/13 1238  BP: 132/80  Pulse: 74  Resp: 16   Filed Weights   07/01/13 1238  Weight: 142 lb (64.411 kg)   Body mass index is 22.24 kg/(m^2).  General: WDWN male in NAD GAIT: using rolling walker, shuffling Eyes: PERRLA Pulmonary:  Non-labored, CTAB, Negative  Rales, Negative rhonchi, & Negative wheezing.  Cardiac: regular Rhythm ,  No detected murmur.  VASCULAR EXAM Carotid Bruits Left Right   Negative Negative     Radial pulses are 1+ palpable and  equal.  LE Pulses LEFT RIGHT       POPLITEAL  not palpable   not palpable    Gastrointestinal: soft, nontender, BS WNL, no r/g,  negative masses.  Musculoskeletal: Positive muscle atrophy/wasting, age and condition appropriate.  Extremities without ischemic changes. Severe kyphosis.  Neurologic: A&O X 3; Appropriate Affect ; SENSATION  ;normal;  Speech is normal CN 2-12 is intact, Pain and light touch intact in extremities.   Non-Invasive Vascular Imaging CAROTID DUPLEX 07/01/2013   CEREBROVASCULAR DUPLEX EVALUATION    INDICATION: Follow up carotid artery disease    PREVIOUS INTERVENTION(S): Known right internal carotid artery occlusion.    DUPLEX EXAM: Carotid duplex    RIGHT  LEFT  Peak Systolic Velocities (cm/s) End Diastolic Velocities (cm/s) Plaque LOCATION Peak Systolic Velocities (cm/s) End Diastolic Velocities (cm/s) Plaque  26 5 HT CCA PROXIMAL 108 26 HT  26 5 HT CCA MID 75 21 HT  23 4 HT CCA DISTAL 90 14 HT  90 5 HT ECA 130 8 HT  Occluded - HT ICA PROXIMAL 135 42 HT  Occluded - HT ICA MID 111 34 -  Occluded - HT ICA DISTAL 93 22 -    N/A ICA / CCA Ratio (PSV) 1.8  Antegrade Vertebral Flow Antegrade  195 Brachial Systolic Pressure (mmHg) 093  Biphasic Brachial Artery Waveforms Biphasic    Plaque Morphology:  HM = Homogeneous, HT = Heterogeneous, CP = Calcific Plaque, SP = Smooth Plaque, IP = Irregular Plaque     ADDITIONAL FINDINGS: Slight increase of left internal carotid artery velocity may be due to tortuosity.    IMPRESSION: 1. Known right internal carotid artery occlusion. 2. 1-39% left internal carotid artery stenosis.    Compared to the previous exam:  No significant change    Assessment: Antonio Hester is an 80 y.o. male who presents with asymptomatic known right internal carotid artery occlusion and<40% left internal carotid artery stenosis. The  ICA stenosis is  Unchanged from previous exam. There is no extracranial carotid artery stenosis that requires intervention, that would improve his eyesight or decrease his stroke risk. Vertebral arteries are antegrade.  Plan: Follow-up in 1 year with Carotid Duplex scan.   I discussed in depth with the patient the nature of atherosclerosis, and emphasized the importance of maximal medical management including strict control of blood  pressure, blood glucose, and lipid levels, obtaining regular exercise, and continued cessation of smoking.  The patient is aware that without maximal medical management the underlying atherosclerotic disease process will progress, limiting the benefit of any interventions. The patient was given information about stroke prevention and what symptoms should prompt the patient to seek immediate medical care. Thank you for allowing [place] to participate in this patient's care.  Clemon Chambers, RN, MSN, FNP-C [facility] Office: [facility] Physician: Trula Slade  07/01/2013 12:43 PM

## 2013-07-02 ENCOUNTER — Ambulatory Visit: Payer: Medicare Other | Admitting: Neurosurgery

## 2013-07-02 ENCOUNTER — Other Ambulatory Visit: Payer: Medicare Other

## 2013-07-22 ENCOUNTER — Other Ambulatory Visit: Payer: Self-pay | Admitting: Internal Medicine

## 2013-07-31 ENCOUNTER — Encounter: Payer: Self-pay | Admitting: Cardiology

## 2013-07-31 ENCOUNTER — Ambulatory Visit (INDEPENDENT_AMBULATORY_CARE_PROVIDER_SITE_OTHER): Payer: Medicare Other | Admitting: Cardiology

## 2013-07-31 VITALS — BP 112/55 | HR 77 | Ht 67.0 in | Wt 141.0 lb

## 2013-07-31 DIAGNOSIS — E785 Hyperlipidemia, unspecified: Secondary | ICD-10-CM

## 2013-07-31 DIAGNOSIS — I420 Dilated cardiomyopathy: Secondary | ICD-10-CM

## 2013-07-31 DIAGNOSIS — I5022 Chronic systolic (congestive) heart failure: Secondary | ICD-10-CM

## 2013-07-31 DIAGNOSIS — I6529 Occlusion and stenosis of unspecified carotid artery: Secondary | ICD-10-CM

## 2013-07-31 DIAGNOSIS — Z950 Presence of cardiac pacemaker: Secondary | ICD-10-CM

## 2013-07-31 DIAGNOSIS — I428 Other cardiomyopathies: Secondary | ICD-10-CM

## 2013-07-31 DIAGNOSIS — I4891 Unspecified atrial fibrillation: Secondary | ICD-10-CM

## 2013-07-31 DIAGNOSIS — I679 Cerebrovascular disease, unspecified: Secondary | ICD-10-CM

## 2013-07-31 DIAGNOSIS — I10 Essential (primary) hypertension: Secondary | ICD-10-CM

## 2013-07-31 MED ORDER — LOSARTAN POTASSIUM 25 MG PO TABS: 25.0000 mg | ORAL_TABLET | Freq: Every day | ORAL | Status: AC

## 2013-07-31 NOTE — Assessment & Plan Note (Signed)
Plan continue bystolic. Add Cozaar 25 mg daily. ACE inhibitor previously DC'd in [facility] because of potentially exacerbating his COPD. He has baseline renal insufficiency. Check potassium and renal function in 5 days.

## 2013-07-31 NOTE — Assessment & Plan Note (Signed)
Continue statin. 

## 2013-07-31 NOTE — Assessment & Plan Note (Signed)
Patient has a history of atrial fibrillation as well as atrial fibrillation ablation. He has had previous CVAs and would certainly benefit from long-term anticoagulation. However he has frequent falls and anticoagulation was discontinued previously for this which I think is appropriate. Continue beta blocker. Continue ASA.

## 2013-07-31 NOTE — Progress Notes (Signed)
HPI: FU atrial fibrillation and cardiomyopathy. Patient had previous care in [facility]. Not all records available. He apparently has a history of nonischemic cardiomyopathy. Echo in 2009 showed an ejection fraction of 20-25%, biatrial enlargement, right ventricular enlargement, mild to moderate mitral regurgitation, moderately severe tricuspid regurgitation and small ASD. No history of coronary disease by report. He does have a history of atrial fibrillation ablation. Coumadin stopped previously because of recurrent falls. Carotid Dopplers in February 2014 showed an occluded right carotid and 0-39% left stenosis. He is frail and on Home O2 for COPD. I last saw him in Dec 2014. Since then, he denies orthopnea, PND, pedal edema, chest pain or syncope. Chronic dyspnea on exertion.   Current Outpatient Prescriptions  Medication Sig Dispense Refill  . acetaminophen (TYLENOL) 500 MG tablet Take twice a day      . arformoterol (BROVANA) 15 MCG/2ML NEBU Take 2 mLs (15 mcg total) by nebulization 2 (two) times daily.  120 mL  1  . aspirin 81 MG tablet Take 81 mg by mouth daily. And pt takes 1 every other day      . benzonatate (TESSALON) 100 MG capsule TAKE 1 CAPSULE BY MOUTH 3 TIMES DAILY AS NEEDED FOR COUGH.  60 capsule  3  . budesonide (PULMICORT) 0.25 MG/2ML nebulizer solution Take 2 mLs (0.25 mg total) by nebulization 2 (two) times daily.  120 mL  1  . calcium-vitamin D (OSCAL) 250-125 MG-UNIT per tablet Take 1 tablet by mouth daily.      . carbidopa-levodopa (SINEMET IR) 25-100 MG per tablet Take 1 tablet by mouth 3 (three) times daily.      . Cholecalciferol (VITAMIN D PO) Take 1 tablet by mouth daily.      . Cyanocobalamin (VITAMIN B-12 PO) Take 1 tablet by mouth daily.      [name] Dextromethorphan-Guaifenesin 20-200 MG/15ML SYRP Take by mouth every morning.      . furosemide (LASIX) 20 MG tablet TAKE 1 TABLET BY MOUTH TWICE DAILY IN THE MORNING AND EVENING. ON TUESDAY AND FRIDAY, TAKE 2 TABLETS  TWICE DAILY IN THE MORNING AND EVENING  60 tablet  0  . Ipratropium-Albuterol (COMBIVENT RESPIMAT) 20-100 MCG/ACT AERS respimat Inhale 1 puff into the lungs 4 (four) times daily as needed.  1 Inhaler  5  . loratadine (CLARITIN) 10 MG tablet Take 15 mg by mouth daily.       . Multiple Vitamin (MULTIVITAMIN) tablet Take 1 tablet by mouth daily.      . nebivolol (BYSTOLIC) 5 MG tablet Take 2.5 mg by mouth daily.      . phenytoin (DILANTIN) 100 MG ER capsule       . Polyethyl Glycol-Propyl Glycol (SYSTANE OP) Apply 1 drop to eye 4 (four) times daily as needed.       . polyethylene glycol (MIRALAX / GLYCOLAX) packet Take 17 g by mouth daily.      . simvastatin (ZOCOR) 80 MG tablet Take 1/2 tab daily       No current facility-administered medications for this visit.     Past Medical History  Diagnosis Date  . CAD (coronary artery disease)   . Atrial fibrillation     s/p ablation 07/2007  . Heart block     complete, and pacemaker dependence s/p defib change 06/2008  . Cardiomyopathy     non ischemic  . COPD (chronic obstructive pulmonary disease)   . BPH (benign prostatic hyperplasia)   . Seizure  d/o (sees Dr.Love, last Ov 2006)  . Hyperlipemia   . Hypertension   . Abscess of lung(513.0)   . GERD (gastroesophageal reflux disease)     severe esophagitis per EGD 2007  . Osteopenia     per DEXA 10/2009  . Osteoarthritis   . Carotid artery occlusion     right ICA -occluded/ left ICA 60-79% stenosis  . Inguinal hernia   . Renal insufficiency   . CHF (congestive heart failure)   . Stroke     several years ago  . Cancer     pre-Cancer lesion-neck    Past Surgical History  Procedure Laterality Date  . Pacemaker insertion      st jude  . Cardiac defibrillator placement  06/2008  . Hernia repair  10/05/2009    right inguinal hernia    History   Social History  . Marital Status: Married    Spouse Name: N/A    Number of Children: 2  . Years of Education: N/A   Occupational  History  . retired    Social History Main Topics  . Smoking status: Former Smoker -- 2.00 packs/day for 50 years    Types: Cigarettes    Quit date: 01/04/1996  . Smokeless tobacco: Never Used  . Alcohol Use: 4.0 oz/week    8 drink(s) per week     Comment: wine sometimes  . Drug Use: No  . Sexual Activity: Not on file   Other Topics Concern  . Not on file   Social History Narrative   Lives at [place], has an apartment, lives w/ wife, has a Magazine features editor.   Son brings food, has acces to the cafeteria   Son oversees meds     ROS: resting tremor but no fevers or chills, productive cough, hemoptysis, dysphasia, odynophagia, melena, hematochezia, dysuria, hematuria, rash, seizure activity, orthopnea, PND, pedal edema, claudication. Remaining systems are negative.  Physical Exam: Well-developed very frail in no acute distress.  Skin is warm and dry.  HEENT is normal.  Neck is supple.  Chest is clear to auscultation with normal expansion.  Cardiovascular exam is regular rate and rhythm.  Abdominal exam nontender or distended. No masses palpated. Extremities show no edema. neuro grossly intact  ECG ventricular paced rhythm with occasional PVC. Underlying atrial fibrillation.

## 2013-07-31 NOTE — Assessment & Plan Note (Signed)
Continue aspirin and statin. Followed by vascular surgery. 

## 2013-07-31 NOTE — Assessment & Plan Note (Signed)
We will arrange evaluation by electrophysiology to follow his pacemaker.

## 2013-07-31 NOTE — Patient Instructions (Signed)
Your physician wants you to follow-up in: [place] will receive a reminder letter in the mail two months in advance. If you don't receive a letter, please call our office to schedule the follow-up appointment.   START LOSARTAN 25 MG ONCE DAILY  Your physician recommends that you return for lab work ON Monday 08-05-13 IN [place]

## 2013-07-31 NOTE — Assessment & Plan Note (Signed)
euvolemic on examination.continue present dose of Lasix.

## 2013-07-31 NOTE — Assessment & Plan Note (Signed)
Blood pressure controlled. Continue present medications. 

## 2013-08-05 ENCOUNTER — Other Ambulatory Visit (INDEPENDENT_AMBULATORY_CARE_PROVIDER_SITE_OTHER): Payer: Medicare Other

## 2013-08-05 DIAGNOSIS — I679 Cerebrovascular disease, unspecified: Secondary | ICD-10-CM

## 2013-08-05 DIAGNOSIS — E785 Hyperlipidemia, unspecified: Secondary | ICD-10-CM

## 2013-08-05 DIAGNOSIS — Z950 Presence of cardiac pacemaker: Secondary | ICD-10-CM

## 2013-08-05 DIAGNOSIS — I5022 Chronic systolic (congestive) heart failure: Secondary | ICD-10-CM

## 2013-08-05 DIAGNOSIS — I10 Essential (primary) hypertension: Secondary | ICD-10-CM

## 2013-08-05 DIAGNOSIS — I420 Dilated cardiomyopathy: Secondary | ICD-10-CM

## 2013-08-05 DIAGNOSIS — I428 Other cardiomyopathies: Secondary | ICD-10-CM

## 2013-08-05 DIAGNOSIS — I4891 Unspecified atrial fibrillation: Secondary | ICD-10-CM

## 2013-08-05 LAB — BASIC METABOLIC PANEL
BUN: 40 mg/dL — ABNORMAL HIGH (ref 6–23)
CO2: 28 mEq/L (ref 19–32)
CREATININE: 1.7 mg/dL — AB (ref 0.4–1.5)
Calcium: 8.8 mg/dL (ref 8.4–10.5)
Chloride: 103 mEq/L (ref 96–112)
GFR: 41.58 mL/min — ABNORMAL LOW (ref >60.00)
Glucose, Bld: 76 mg/dL (ref 70–99)
Potassium: 4.4 mEq/L (ref 3.5–5.1)
Sodium: 138 mEq/L (ref 135–145)

## 2013-08-24 ENCOUNTER — Other Ambulatory Visit: Payer: Self-pay | Admitting: Internal Medicine

## 2013-08-27 ENCOUNTER — Encounter: Payer: Self-pay | Admitting: Internal Medicine

## 2013-08-27 ENCOUNTER — Ambulatory Visit (INDEPENDENT_AMBULATORY_CARE_PROVIDER_SITE_OTHER): Payer: Medicare Other | Admitting: Internal Medicine

## 2013-08-27 VITALS — BP 89/57 | HR 75 | Ht 67.0 in | Wt 141.0 lb

## 2013-08-27 DIAGNOSIS — Z95810 Presence of automatic (implantable) cardiac defibrillator: Secondary | ICD-10-CM

## 2013-08-27 DIAGNOSIS — I5022 Chronic systolic (congestive) heart failure: Secondary | ICD-10-CM

## 2013-08-27 DIAGNOSIS — I428 Other cardiomyopathies: Secondary | ICD-10-CM

## 2013-08-27 DIAGNOSIS — I4891 Unspecified atrial fibrillation: Secondary | ICD-10-CM

## 2013-08-27 DIAGNOSIS — I6529 Occlusion and stenosis of unspecified carotid artery: Secondary | ICD-10-CM

## 2013-08-27 LAB — MDC_IDC_ENUM_SESS_TYPE_INCLINIC
Battery Remaining Longevity: 22.8 mo
Brady Statistic RA Percent Paced: 0 %
Brady Statistic RV Percent Paced: 96 %
Date Time Interrogation Session: 20150407192137
HighPow Impedance: 37.4005
Implantable Pulse Generator Serial Number: 473612
Lead Channel Impedance Value: 350 Ohm
Lead Channel Impedance Value: 350 Ohm
Lead Channel Impedance Value: 612.5 Ohm
Lead Channel Pacing Threshold Amplitude: 0.5 V
Lead Channel Pacing Threshold Amplitude: 0.5 V
Lead Channel Pacing Threshold Amplitude: 0.5 V
Lead Channel Pacing Threshold Pulse Width: 0.4 ms
Lead Channel Setting Pacing Amplitude: 1.5 V
Lead Channel Setting Pacing Pulse Width: 0.4 ms
MDC IDC MSMT BATTERY VOLTAGE: 2.56 V
MDC IDC MSMT LEADCHNL LV PACING THRESHOLD AMPLITUDE: 0.5 V
MDC IDC MSMT LEADCHNL LV PACING THRESHOLD PULSEWIDTH: 0.4 ms
MDC IDC MSMT LEADCHNL LV PACING THRESHOLD PULSEWIDTH: 0.4 ms
MDC IDC MSMT LEADCHNL RV PACING THRESHOLD PULSEWIDTH: 0.4 ms
MDC IDC MSMT LEADCHNL RV SENSING INTR AMPL: 11.2 mV
MDC IDC SET LEADCHNL LV PACING PULSEWIDTH: 0.4 ms
MDC IDC SET LEADCHNL RV PACING AMPLITUDE: 2.5 V
MDC IDC SET LEADCHNL RV SENSING SENSITIVITY: 2 mV
Zone Setting Detection Interval: 310 ms
Zone Setting Detection Interval: 360 ms

## 2013-08-27 NOTE — Progress Notes (Signed)
ELECTROPHYSIOLOGY CONSULT NOTE  Patient ID: Antonio Hester, MRN: [id number], DOB/AGE: 78-May-1932 78 y.o. Admit date: (Not on file) Date of Consult: 08/27/2013  Primary Physician: Kathlene November, MD Primary Cardiologist: [facility]  Chief Complaint: establish pacemaker   HPI Antonio Hester is a 80 y.o. male  Seen to establish followup for a CRT-D implanted 2009 following upgrade of the previously implanted device. At that time she underwent AV junction ablation for recurrent atrial fibrillation. It is my impression that he had already isolation as initial procedure but the family is not clear on this.  He has a history of a nonischemic cardiomyopathy identified in 2009. At that time his ejection fraction of 20-25% with biatrial and biventricular enlargement and moderate AV valvular regurgitation.   This history of atrial fibrillation underwent ablation. Thromboembolic risk profile is notable for prior TIA, age, hypertension, heart failure for a CHADS-VASc score of at least 6   Coumadin was stopped because of recurrent falls. He is on aspirin  He has oxygen-dependent COPD.   Past Medical History  Diagnosis Date  . CAD (coronary artery disease)   . Atrial fibrillation     s/p ablation 07/2007  . Heart block     complete, and pacemaker dependence s/p defib change 06/2008  . Cardiomyopathy     non ischemic  . COPD (chronic obstructive pulmonary disease)   . BPH (benign prostatic hyperplasia)   . Seizure     d/o (sees Dr.Love, last Ov 2006)  . Hyperlipemia   . Hypertension   . Abscess of lung(513.0)   . GERD (gastroesophageal reflux disease)     severe esophagitis per EGD 2007  . Osteopenia     per DEXA 10/2009  . Osteoarthritis   . Carotid artery occlusion     right ICA -occluded/ left ICA 60-79% stenosis  . Inguinal hernia   . Renal insufficiency   . CHF (congestive heart failure)   . Stroke     several years ago  . Cancer     pre-Cancer lesion-neck      Surgical History:  Past  Surgical History  Procedure Laterality Date  . Pacemaker insertion      st jude  . Cardiac defibrillator placement  06/2008  . Hernia repair  10/05/2009    right inguinal hernia     Home Meds: Prior to Admission medications   Medication Sig Start Date End Date Taking? Authorizing Provider  acetaminophen (TYLENOL) 500 MG tablet Take twice a day   Yes Historical Provider, MD  arformoterol (BROVANA) 15 MCG/2ML NEBU Take 2 mLs (15 mcg total) by nebulization 2 (two) times daily. 06/01/12  Yes Elsie Stain, MD  aspirin 81 MG tablet Take 81 mg by mouth daily. And pt takes 1 every other day   Yes Historical Provider, MD  benzonatate (TESSALON) 100 MG capsule TAKE 1 CAPSULE BY MOUTH 3 TIMES DAILY AS NEEDED FOR COUGH. 12/26/12  Yes Colon Branch, MD  budesonide (PULMICORT) 0.25 MG/2ML nebulizer solution Take 2 mLs (0.25 mg total) by nebulization 2 (two) times daily. 06/01/12  Yes Elsie Stain, MD  calcium-vitamin D (OSCAL) 250-125 MG-UNIT per tablet Take 1 tablet by mouth daily.   Yes Historical Provider, MD  carbidopa-levodopa (SINEMET IR) 25-100 MG per tablet Take 1 tablet by mouth 3 (three) times daily.   Yes Historical Provider, MD  Cholecalciferol (VITAMIN D PO) Take 1 tablet by mouth daily.   Yes Historical Provider, MD  Cyanocobalamin (VITAMIN B-12 PO) Take  1 tablet by mouth daily.   Yes Historical Provider, MD  Dextromethorphan-Guaifenesin 20-200 MG/15ML SYRP Take by mouth every morning.   Yes Historical Provider, MD  furosemide (LASIX) 20 MG tablet TAKE 1 TABLET TWICE DAILY(MORNING/EVENING) ON TUESDAY AND FRIDAY. TAKE 2 TABLETS TWICE DAILY ALL OTHER DAYS. NEEDS FOLLOW-UP APPOINTMENT.   Yes Colon Branch, MD  Ipratropium-Albuterol (COMBIVENT RESPIMAT) 20-100 MCG/ACT AERS respimat Inhale 1 puff into the lungs 4 (four) times daily as needed. 03/12/13  Yes Elsie Stain, MD  loratadine (CLARITIN) 10 MG tablet Take 15 mg by mouth daily.    Yes Historical Provider, MD  losartan (COZAAR) 25 MG  tablet Take 1 tablet (25 mg total) by mouth daily. 07/31/13  Yes Lelon Perla, MD  Multiple Vitamin (MULTIVITAMIN) tablet Take 1 tablet by mouth daily.   Yes Historical Provider, MD  nebivolol (BYSTOLIC) 5 MG tablet Take 2.5 mg by mouth daily. 03/14/13  Yes Colon Branch, MD  phenytoin (DILANTIN) 100 MG ER capsule  11/10/12  Yes Colon Branch, MD  Polyethyl Glycol-Propyl Glycol (SYSTANE OP) Apply 1 drop to eye 4 (four) times daily as needed.    Yes Historical Provider, MD  polyethylene glycol (MIRALAX / GLYCOLAX) packet Take 17 g by mouth daily.   Yes Historical Provider, MD  simvastatin (ZOCOR) 80 MG tablet Take 1/2 tab daily   Yes Historical Provider, MD    Allergies:  Allergies  Allergen Reactions  . Hydrocodone Other (See Comments)    Delusional.  . Penicillins Rash    REACTION: rash    History   Social History  . Marital Status: Married    Spouse Name: N/A    Number of Children: 2  . Years of Education: N/A   Occupational History  . retired    Social History Main Topics  . Smoking status: Former Smoker -- 2.00 packs/day for 50 years    Types: Cigarettes    Quit date: 01/04/1996  . Smokeless tobacco: Never Used  . Alcohol Use: 4.0 oz/week    8 drink(s) per week     Comment: wine sometimes  . Drug Use: No  . Sexual Activity: Not on file   Other Topics Concern  . Not on file   Social History Narrative   Lives at [place], has an apartment, lives w/ wife, has a Magazine features editor.   Son brings food, has acces to the cafeteria   Son oversees meds      Family History  Problem Relation Age of Onset  . Heart disease Mother   . Cancer Mother   . Heart disease Father     Heart Disease before age 25  . Heart attack Father   . Cancer Brother   . Hyperlipidemia Son   . Hypertension Son      ROS:  Please see the history of present illness.     All other systems reviewed and negative.    Physical Exam   Blood pressure 89/57, pulse 75, height 5\' 7"  (1.702 m), weight 141 lb  (63.957 kg). General: Well developed, cachectic and kyphotic elderly age appearing 27  male in mild respiratory distress wearing oxygen Head: Normocephalic, atraumatic, sclera non-icteric, no xanthomas, nares are without discharge. EENT: normal Lymph Nodes:  none Back: with marked kyphosis *, no CVA tendersness Neck: Negative for carotid bruits. JVD not elevated. Lungs: Clear bilaterally to auscultation without wheezes, rales, or rhonchi. Breathing is unlabored. Heart: RRR with S1 S2.  2/6 systolic murmur , rubs,  or gallops appreciated. Abdomen: Soft, non-tender, non-distended with normoactive bowel sounds. No hepatomegaly. No rebound/guarding. No obvious abdominal masses. Msk:  Strength and tone appear normal for age. Extremities: No clubbing or cyanosis. No * edema.  Distal pedal pulses are 2+ and equal bilaterally. Skin: Warm and Dry Neuro: Alert and oriented X 3. CN III-XII intact Grossly normal sensory and motor function . Psych:  Responds to questions appropriately with a normal affect.      Labs: Cardiac Enzymes No results found for this basename: CKTOTAL, CKMB, TROPONINI,  in the last 72 hours CBC Lab Results  Component Value Date   WBC 6.1 01/29/2013   HGB 11.1* 01/29/2013   HCT 33.9* 01/29/2013   MCV 90.3 01/29/2013   PLT 164.0 01/29/2013   PROTIME: No results found for this basename: LABPROT, INR,  in the last 72 hours Chemistry No results found for this basename: NA, K, CL, CO2, BUN, CREATININE, CALCIUM, LABALBU, PROT, BILITOT, ALKPHOS, ALT, AST, GLUCOSE,  in the last 168 hours Lipids Lab Results  Component Value Date   CHOL 131 10/12/2009   HDL 41.00 10/12/2009   LDLCALC 80 10/12/2009   TRIG 50.0 10/12/2009   BNP Pro B Natriuretic peptide (BNP)  Date/Time Value Ref Range Status  09/10/2010  8:17 PM 148.0* 0.0 - 100.0 pg/mL Final  07/29/2008 10:46 AM 1095.0* 0.0-100.0 pg/mL Final   Miscellaneous No results found for this basename: DDIMER    Radiology/Studies:  No  results found.  EKG:  Atrial fibrillation with complete heart block and ventricular pacing  QRS duration 146 ms; QRS upright V1   Assessment and Plan: Atrial fibrillation-permanent  CRT-D-St. Jude The patient's device was interrogated.  The information was reviewed. No changes were made in the programming.     Complete heart block S./P. AV ablation  CHADS-VASc score-6 (prior TIA/age/heart failure/hypertension)  Nonischemic cardio myopathy  Oxygen-dependent COPD  HFrEF-chronic  The patient is relatively stable. Device function Is normal.  I've outlined for the son thoughts on the role of anticoagulation in this patient with a very high thromboembolic risk. His falls are relatively slow and I would anticipate his annual risk of stroke is in the order of 10%.  I would be inclined toward low-dose progress. This will be deferred to Dr. London Sheer.     Virl Axe

## 2013-08-27 NOTE — Patient Instructions (Addendum)
Remote monitoring is used to monitor your  ICD from home. This monitoring reduces the number of office visits required to check your device to one time per year. It allows [place] to keep an eye on the functioning of your device to ensure it is working properly. You are scheduled for a device check from home on 11-28-2013. You may send your transmission at any time that day. If you have a wireless device, the transmission will be sent automatically. After your physician reviews your transmission, you will receive a postcard with your next transmission date.  Your physician recommends that you schedule a follow-up appointment in: 12 months with [place]  Your physician recommends that you continue on your current medications as directed. Please refer to the Current Medication list given to you today.

## 2013-10-01 ENCOUNTER — Other Ambulatory Visit: Payer: Self-pay

## 2013-10-01 MED ORDER — SIMVASTATIN 80 MG PO TABS: ORAL_TABLET | ORAL | Status: AC

## 2013-10-22 ENCOUNTER — Other Ambulatory Visit: Payer: Self-pay | Admitting: Internal Medicine

## 2013-11-25 ENCOUNTER — Other Ambulatory Visit: Payer: Self-pay | Admitting: Internal Medicine

## 2013-11-25 DIAGNOSIS — R609 Edema, unspecified: Secondary | ICD-10-CM

## 2013-11-25 NOTE — Telephone Encounter (Signed)
Refill for lasix sent to [facility], left message on patients vm that he is overdue for appt.

## 2013-11-28 ENCOUNTER — Other Ambulatory Visit: Payer: Self-pay | Admitting: Internal Medicine

## 2013-11-28 ENCOUNTER — Encounter: Payer: Medicare Other | Admitting: *Deleted

## 2013-11-29 ENCOUNTER — Encounter: Payer: Self-pay | Admitting: Cardiology

## 2013-12-02 ENCOUNTER — Ambulatory Visit (HOSPITAL_BASED_OUTPATIENT_CLINIC_OR_DEPARTMENT_OTHER)
Admission: RE | Admit: 2013-12-02 | Discharge: 2013-12-02 | Disposition: A | Discharge status: Home / Self Care | Payer: Medicare Other | Source: Ambulatory Visit | Attending: Internal Medicine | Admitting: Internal Medicine

## 2013-12-02 ENCOUNTER — Encounter: Payer: Self-pay | Admitting: Internal Medicine

## 2013-12-02 ENCOUNTER — Ambulatory Visit (INDEPENDENT_AMBULATORY_CARE_PROVIDER_SITE_OTHER): Payer: Medicare Other | Admitting: Internal Medicine

## 2013-12-02 VITALS — BP 95/61 | HR 75 | Temp 98.0°F | Wt 140.0 lb

## 2013-12-02 DIAGNOSIS — I2510 Atherosclerotic heart disease of native coronary artery without angina pectoris: Secondary | ICD-10-CM

## 2013-12-02 DIAGNOSIS — L989 Disorder of the skin and subcutaneous tissue, unspecified: Secondary | ICD-10-CM

## 2013-12-02 DIAGNOSIS — R569 Unspecified convulsions: Secondary | ICD-10-CM

## 2013-12-02 DIAGNOSIS — M25522 Pain in left elbow: Secondary | ICD-10-CM

## 2013-12-02 DIAGNOSIS — M25529 Pain in unspecified elbow: Secondary | ICD-10-CM

## 2013-12-02 DIAGNOSIS — I10 Essential (primary) hypertension: Secondary | ICD-10-CM

## 2013-12-02 NOTE — Patient Instructions (Addendum)
Get your blood work before you leave     Next visit is for routine check up ,  Fasting, in 4-5 months  Please make an appointment     Check the  blood pressure  Weekly  be sure it is between 110/60 and 140/85. Ideal blood pressure is 120/80. If it is consistently higher or lower, let me know   Please get your x-ray at the other [facility]  office located at: [address], across from [facility].  Please go to the basement, this is a walk-in facility, they are open from 8:30 to 5:30 PM. Phone number [phone].

## 2013-12-02 NOTE — Progress Notes (Signed)
Pre visit review using our clinic review tool, if applicable. No additional management support is needed unless otherwise documented below in the visit note. 

## 2013-12-02 NOTE — Progress Notes (Signed)
Subjective:    Patient ID: Antonio Hester, male    DOB: [birth year], 80 y.o.   MRN: [id number]  DOS:  12/02/2013 Type of visit - description: routine, here w/ his son History: Still has occasional nosebleeds,   they think due to the oxygen tubing,  seems to be significant at times per pt  . Has a skin lesion on her forehead x one or 2 months, is not going away, no bleeding. CAD, hypertension: Saw cardiology  Dr. Stanford Breed and Dr. Caryl Comes, here seems to be stable, losartan was added. Complained of ill-defined left arm pain around the elbow for 2 months, no injury or fall that he recalls BP today slightly low, no ambulatory BPs. Likes his inguinal hernia to be checked    ROS  No fever or chills No chest pain or difficulty breathing. No lower extremity edema lately. Very mild amount of cough or sputum lately. Energy level is stable and  very good lately. Appetite is good on and off.  Past Medical History  Diagnosis Date  . CAD (coronary artery disease)   . Atrial fibrillation     s/p ablation 07/2007  . Heart block     complete, and pacemaker dependence s/p defib change 06/2008  . Cardiomyopathy     non ischemic  . COPD (chronic obstructive pulmonary disease)   . BPH (benign prostatic hyperplasia)   . Seizure     d/o (sees Dr.Love, last Ov 2006)  . Hyperlipemia   . Hypertension   . Abscess of lung(513.0)   . GERD (gastroesophageal reflux disease)     severe esophagitis per EGD 2007  . Osteopenia     per DEXA 10/2009  . Osteoarthritis   . Carotid artery occlusion     right ICA -occluded/ left ICA 60-79% stenosis  . Inguinal hernia   . Renal insufficiency   . CHF (congestive heart failure)   . Stroke     several years ago  . Cancer     pre-Cancer lesion-neck    Past Surgical History  Procedure Laterality Date  . Pacemaker insertion      st jude  . Cardiac defibrillator placement  06/2008  . Hernia repair  10/05/2009    right inguinal hernia    History   Social  History  . Marital Status: Married    Spouse Name: N/A    Number of Children: 2  . Years of Education: N/A   Occupational History  . retired    Social History Main Topics  . Smoking status: Former Smoker -- 2.00 packs/day for 50 years    Types: Cigarettes    Quit date: 01/04/1996  . Smokeless tobacco: Never Used  . Alcohol Use: 4.0 oz/week    8 drink(s) per week     Comment: wine sometimes  . Drug Use: No  . Sexual Activity: Not on file   Other Topics Concern  . Not on file   Social History Narrative   Lives at [place], has an apartment, lives w/ wife.   Son brings food, has acces to the cafeteria   Son oversees meds         Medication List       This list is accurate as of: 12/02/13  3:26 PM.  Always use your most recent med list.               acetaminophen 500 MG tablet  Commonly known as:  TYLENOL  Take twice a day  arformoterol 15 MCG/2ML Nebu  Commonly known as:  BROVANA  Take 2 mLs (15 mcg total) by nebulization 2 (two) times daily.     aspirin 81 MG tablet  Take 81 mg by mouth daily. And pt takes 1 every other day     benzonatate 100 MG capsule  Commonly known as:  TESSALON  TAKE 1 CAPSULE BY MOUTH 3 TIMES DAILY AS NEEDED FOR COUGH.     budesonide 0.25 MG/2ML nebulizer solution  Commonly known as:  PULMICORT  Take 2 mLs (0.25 mg total) by nebulization 2 (two) times daily.     calcium-vitamin D 250-125 MG-UNIT per tablet  Commonly known as:  OSCAL  Take 1 tablet by mouth daily.     carbidopa-levodopa 25-100 MG per tablet  Commonly known as:  SINEMET IR  Take 1 tablet by mouth 3 (three) times daily. 2 morning 1 afternoon 1 evening     CELEBREX PO  Take by mouth as needed.     Dextromethorphan-Guaifenesin 20-200 MG/15ML Syrp  Take by mouth every morning.     furosemide 20 MG tablet  Commonly known as:  LASIX  TAKE 1 TABLET BY MOUTH TWICE DAILY(AM AND PM) ON TUESDAY AND FRIDAY AND TAKE 2 TABLETS BY MOUTH DAILY ON ALL OTHER  DAYS. NEEDS APPT     Ipratropium-Albuterol 20-100 MCG/ACT Aers respimat  Commonly known as:  COMBIVENT RESPIMAT  Inhale 1 puff into the lungs 4 (four) times daily as needed.     loratadine 10 MG tablet  Commonly known as:  CLARITIN  Take 15 mg by mouth daily.     losartan 25 MG tablet  Commonly known as:  COZAAR  Take 1 tablet (25 mg total) by mouth daily.     multivitamin tablet  Take 1 tablet by mouth daily.     nebivolol 5 MG tablet  Commonly known as:  BYSTOLIC  Take 2.5 mg by mouth daily.     phenytoin 100 MG ER capsule  Commonly known as:  DILANTIN  TAKE 1 CAPSULES BY MOUTH  DAILY     polyethylene glycol packet  Commonly known as:  MIRALAX / GLYCOLAX  Take 17 g by mouth daily.     simvastatin 80 MG tablet  Commonly known as:  ZOCOR  Take 1/2 tab daily     SYSTANE OP  Apply 1 drop to eye 4 (four) times daily as needed.     VITAMIN B-12 PO  Take 1 tablet by mouth daily.     VITAMIN D PO  Take 1 tablet by mouth daily.           Objective:   Physical Exam  Abdominal:     BP 95/61  Pulse 75  Temp(Src) 98 F (36.7 C)  Wt 140 lb (63.504 kg)  SpO2 99%  General -- alert, well-developed, elderly gentleman, looks chronically ill but no distress   Lungs -- normal respiratory effort, no intercostal retractions, no accessory muscle use, and decreased breath sounds.  Heart-- , reg, soft  murmur.  Extremities-- no pretibial edema bilaterally, Elbow normal to inspection on palpation. No deformities. Neurologic--  alert & oriented X3. Speech slow.  Psych--   No anxious or depressed appearing.       Assessment & Plan:    Left elbow pain, exam is benign, for completeness we'll get x-rays  Skin lesion, Reports 1-2 months history of a skin lesion of the forehead, exam is confirmatory, will refer to dermatology for eval.  Hernia, not  a surgical candidate, recommend observation, alarming symptoms discussed again

## 2013-12-03 ENCOUNTER — Other Ambulatory Visit: Payer: Self-pay | Admitting: Internal Medicine

## 2013-12-03 ENCOUNTER — Telehealth: Payer: Self-pay | Admitting: Internal Medicine

## 2013-12-03 DIAGNOSIS — M25529 Pain in unspecified elbow: Secondary | ICD-10-CM

## 2013-12-03 LAB — BASIC METABOLIC PANEL
BUN: 38 mg/dL — ABNORMAL HIGH (ref 6–23)
CHLORIDE: 101 meq/L (ref 96–112)
CO2: 28 mEq/L (ref 19–32)
Calcium: 8.9 mg/dL (ref 8.4–10.5)
Creatinine, Ser: 1.7 mg/dL — ABNORMAL HIGH (ref 0.4–1.5)
GFR: 40.44 mL/min — ABNORMAL LOW (ref >60.00)
Glucose, Bld: 92 mg/dL (ref 70–99)
POTASSIUM: 4.6 meq/L (ref 3.5–5.1)
SODIUM: 137 meq/L (ref 135–145)

## 2013-12-03 LAB — CBC WITH DIFFERENTIAL/PLATELET
BASOS PCT: 0.2 % (ref 0.0–3.0)
Basophils Absolute: 0 10*3/uL (ref 0.0–0.1)
EOS PCT: 4.1 % (ref 0.0–5.0)
Eosinophils Absolute: 0.2 10*3/uL (ref 0.0–0.7)
HEMATOCRIT: 36.1 % — AB (ref 39.0–52.0)
HEMOGLOBIN: 12.1 g/dL — AB (ref 13.0–17.0)
LYMPHS ABS: 1.1 10*3/uL (ref 0.7–4.0)
Lymphocytes Relative: 25.9 % (ref 12.0–46.0)
MCHC: 33.4 g/dL (ref 30.0–36.0)
MCV: 98.8 fl (ref 78.0–100.0)
MONO ABS: 0.4 10*3/uL (ref 0.1–1.0)
Monocytes Relative: 9.7 % (ref 3.0–12.0)
Neutro Abs: 2.5 10*3/uL (ref 1.4–7.7)
Neutrophils Relative %: 60.1 % (ref 43.0–77.0)
Platelets: 139 10*3/uL — ABNORMAL LOW (ref 150.0–400.0)
RBC: 3.66 Mil/uL — ABNORMAL LOW (ref 4.22–5.81)
RDW: 14.1 % (ref 11.5–15.5)
WBC: 4.2 10*3/uL (ref 4.0–10.5)

## 2013-12-03 LAB — PHENYTOIN LEVEL, TOTAL: Phenytoin Lvl: <2.5 ug/mL — ABNORMAL LOW (ref 10.0–20.0)

## 2013-12-03 NOTE — Telephone Encounter (Signed)
Relevant patient education assigned to patient using Emmi. ° °

## 2013-12-03 NOTE — Assessment & Plan Note (Signed)
CAD, seems to be doing well, no further lower lobe, BP today slightly low but he feels well. No change, recommend to check ambulatory BPs to labs.

## 2013-12-03 NOTE — Assessment & Plan Note (Signed)
Seizure disorder, on Dilantin one tablet daily, before he could intolerant to Dilantin 2 tablets a day. Labs to rule out elevated Dilantin level.

## 2013-12-04 ENCOUNTER — Encounter: Payer: Self-pay | Admitting: *Deleted

## 2013-12-25 ENCOUNTER — Other Ambulatory Visit: Payer: Self-pay | Admitting: Internal Medicine

## 2013-12-26 ENCOUNTER — Other Ambulatory Visit: Payer: Self-pay | Admitting: Internal Medicine

## 2014-01-20 ENCOUNTER — Telehealth: Payer: Self-pay | Admitting: Critical Care Medicine

## 2014-01-20 NOTE — Telephone Encounter (Signed)
Called and spoke with pts son and he stated that the pt fell on Thursday and broke his hip.  He had a partial replacement done on Saturday at [facility].  Pt is still in the hospital and not well enough to do any type of therapy.  pts son stated that his breathing has not been doing well and they now have the pt on the BIPAP and he has been doing better on this machine.  He has been on this for over an hour today and is more alert and moving more.  The doctors have wanted to talk to the family about the pts lung condition and options they may have take.  pts son would like to speak with PW about the pt if at all possible.   Can call on his cell number or the room number.  PW please advise. thaks  Allergies  Allergen Reactions  . Hydrocodone Other (See Comments)    Delusional.  . Penicillins Rash    REACTION: rash    Current Outpatient Prescriptions on File Prior to Visit  Medication Sig Dispense Refill  . acetaminophen (TYLENOL) 500 MG tablet Take twice a day      . arformoterol (BROVANA) 15 MCG/2ML NEBU Take 2 mLs (15 mcg total) by nebulization 2 (two) times daily.  120 mL  1  . aspirin 81 MG tablet Take 81 mg by mouth daily. And pt takes 1 every other day      . benzonatate (TESSALON) 100 MG capsule TAKE 1 CAPSULE BY MOUTH 3 TIMES DAILY AS NEEDED FOR COUGH.  60 capsule  3  . budesonide (PULMICORT) 0.25 MG/2ML nebulizer solution Take 2 mLs (0.25 mg total) by nebulization 2 (two) times daily.  120 mL  1  . calcium-vitamin D (OSCAL) 250-125 MG-UNIT per tablet Take 1 tablet by mouth daily.      . carbidopa-levodopa (SINEMET IR) 25-100 MG per tablet Take 1 tablet by mouth 3 (three) times daily. 2 morning 1 afternoon 1 evening      . Celecoxib (CELEBREX PO) Take by mouth as needed.      . Cholecalciferol (VITAMIN D PO) Take 1 tablet by mouth daily.      . Cyanocobalamin (VITAMIN B-12 PO) Take 1 tablet by mouth daily.      [name] Dextromethorphan-Guaifenesin 20-200 MG/15ML SYRP Take by mouth every  morning.      . furosemide (LASIX) 20 MG tablet TAKE 1 TABLET BY MOUTH TWICE DAILY(AM AND PM) ON TUESDAY AND FRIDAY. TAKE 2 TABLETS BY MOUTH DAILY ON ALL OTHER DAYS  60 tablet  5  . Ipratropium-Albuterol (COMBIVENT RESPIMAT) 20-100 MCG/ACT AERS respimat Inhale 1 puff into the lungs 4 (four) times daily as needed.  1 Inhaler  5  . loratadine (CLARITIN) 10 MG tablet Take 15 mg by mouth daily.       [name] losartan (COZAAR) 25 MG tablet Take 1 tablet (25 mg total) by mouth daily.  90 tablet  3  . Multiple Vitamin (MULTIVITAMIN) tablet Take 1 tablet by mouth daily.      . nebivolol (BYSTOLIC) 5 MG tablet Take 2.5 mg by mouth daily.      . phenytoin (DILANTIN) 100 MG ER capsule TAKE 2 CAPSULES BY MOUTH TWICE DAILY  120 capsule  0  . Polyethyl Glycol-Propyl Glycol (SYSTANE OP) Apply 1 drop to eye 4 (four) times daily as needed.       . polyethylene glycol (MIRALAX / GLYCOLAX) packet Take 17 g by mouth daily.      [name]  simvastatin (ZOCOR) 80 MG tablet Take 1/2 tab daily  45 tablet  3   No current facility-administered medications on file prior to visit.

## 2014-01-20 NOTE — Telephone Encounter (Signed)
i will phone him

## 2014-01-22 NOTE — Telephone Encounter (Signed)
Please advise if this message can be closed. Thanks.

## 2014-01-22 NOTE — Telephone Encounter (Signed)
Dr. Joya Gaskins spoke with Son.  Nothing further needed at this time.  Will close msg.

## 2014-01-23 ENCOUNTER — Other Ambulatory Visit: Payer: Self-pay | Admitting: Internal Medicine

## 2014-01-28 ENCOUNTER — Telehealth: Payer: Self-pay | Admitting: Internal Medicine

## 2014-01-28 ENCOUNTER — Other Ambulatory Visit: Payer: Self-pay | Admitting: Critical Care Medicine

## 2014-01-28 NOTE — Telephone Encounter (Signed)
Thanks for the update

## 2014-01-28 NOTE — Telephone Encounter (Signed)
Caller name:Poehlman,Cole Stewart Relation to pt: son Call back number:[phone]  Reason for call:   discharged 01/24/14 from [facility] to an in house rehab facility [facility]. Pt broke left hip had to have partial hip replacement, oxygen complication after surgery pt was on bi bap oxygen improved in the hosptial for 8 days Dr. Florene Glen is the Dr. Who is handling hes care at the rehab and there having a plan of care this afternoon.

## 2014-01-28 NOTE — Telephone Encounter (Signed)
FYI

## 2014-02-13 ENCOUNTER — Telehealth: Payer: Self-pay | Admitting: Critical Care Medicine

## 2014-02-13 NOTE — Telephone Encounter (Signed)
Antonio Hester returning call, said try back in a/b 44min.Antonio Hester

## 2014-02-13 NOTE — Telephone Encounter (Signed)
[facility] for Antonio Hester to call from [place].

## 2014-02-13 NOTE — Telephone Encounter (Signed)
Called spoke with Benjamine Mola. She reports they faxed over form titled Respiratory medication/RX supplies. Faxed over 02/05/15 for pt brovana and neb parts for his machine. If this has not been received then she will re fax this. Please advise Crystal thanks

## 2014-02-19 NOTE — Telephone Encounter (Signed)
Fax received.  It has been signed by Dr. Joya Gaskins.  I faxed it back to [employer] this morning.   Rich with [facility] aware.

## 2014-03-05 ENCOUNTER — Telehealth: Payer: Self-pay | Admitting: Internal Medicine

## 2014-03-05 ENCOUNTER — Encounter: Payer: Self-pay | Admitting: Cardiology

## 2014-03-05 NOTE — Telephone Encounter (Signed)
FYI  Patient Passed Away on 10/13.

## 2014-03-06 NOTE — Telephone Encounter (Signed)
See below

## 2014-03-11 NOTE — Telephone Encounter (Signed)
I spoke with Tressie Ellis, the patient's son, I'm very  sad about his death

## 2014-03-23 DEATH — deceased

## 2014-04-02 ENCOUNTER — Encounter: Payer: Medicare Other | Admitting: Internal Medicine

## 2014-07-04 ENCOUNTER — Encounter: Payer: Self-pay | Admitting: Family

## 2014-07-07 ENCOUNTER — Other Ambulatory Visit (HOSPITAL_COMMUNITY): Payer: Medicare Other

## 2014-07-07 ENCOUNTER — Ambulatory Visit: Payer: Medicare Other | Admitting: Family

## 2014-07-07 ENCOUNTER — Telehealth: Payer: Self-pay | Admitting: Surgery

## 2014-07-07 NOTE — Telephone Encounter (Signed)
Pt son [name] called to cancel appointments. Mr Antonio Hester passed away on 03/19/2014. He has 2 medical record #s in EPIC- which I have requested merge on.

## 2014-07-21 ENCOUNTER — Telehealth: Payer: Self-pay

## 2014-07-21 NOTE — Telephone Encounter (Signed)
Patient died @ [facility] per Antonio Hester

## 2015-06-04 IMAGING — CR DG CHEST 2V
2 series · 2 of 2 positions shown · non-contrast
Comparison: Chest x-ray of 11/12/2012

CLINICAL DATA: Weight loss, confusion, fatigue

CHEST - 2 VIEW

[w chest pa]
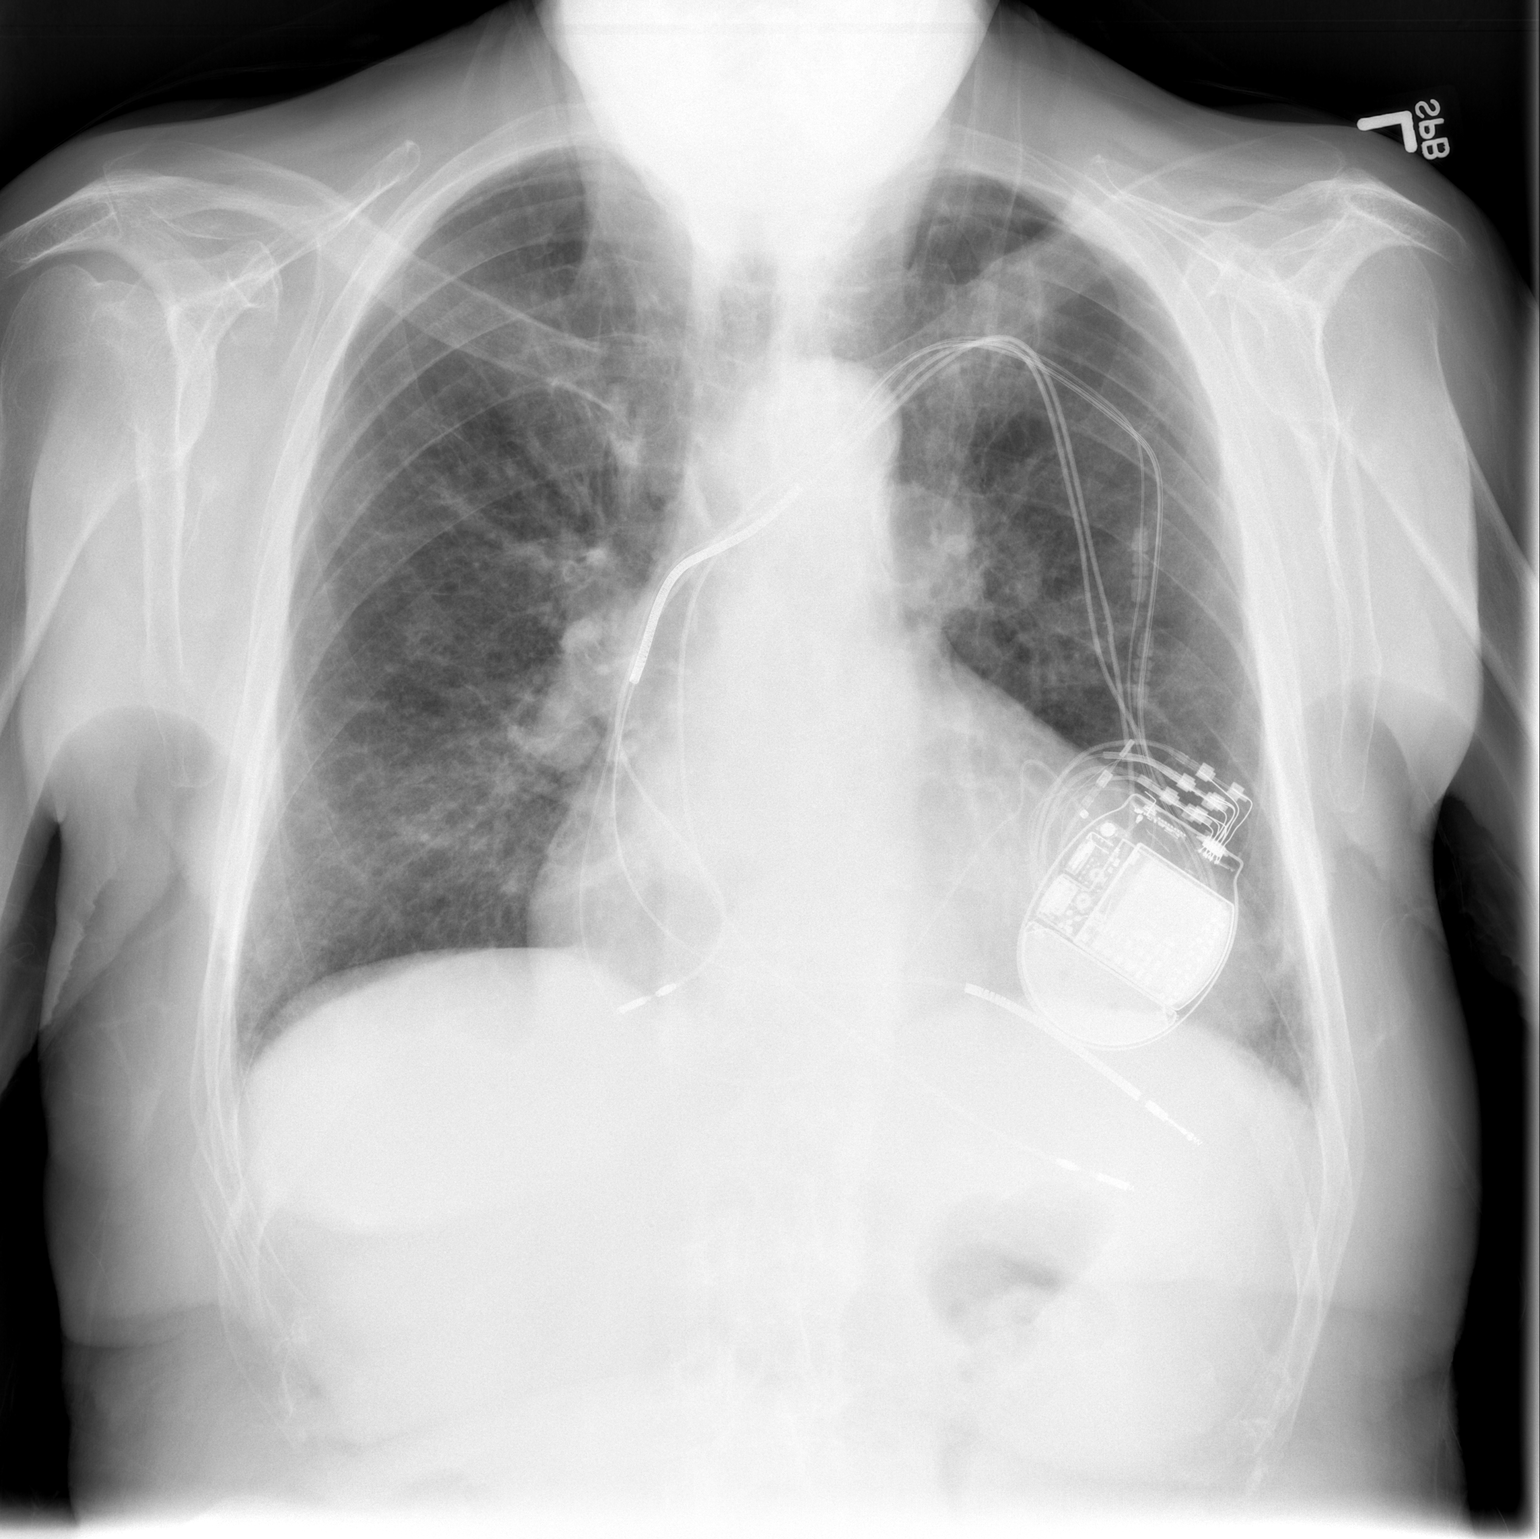

[w chest lat]
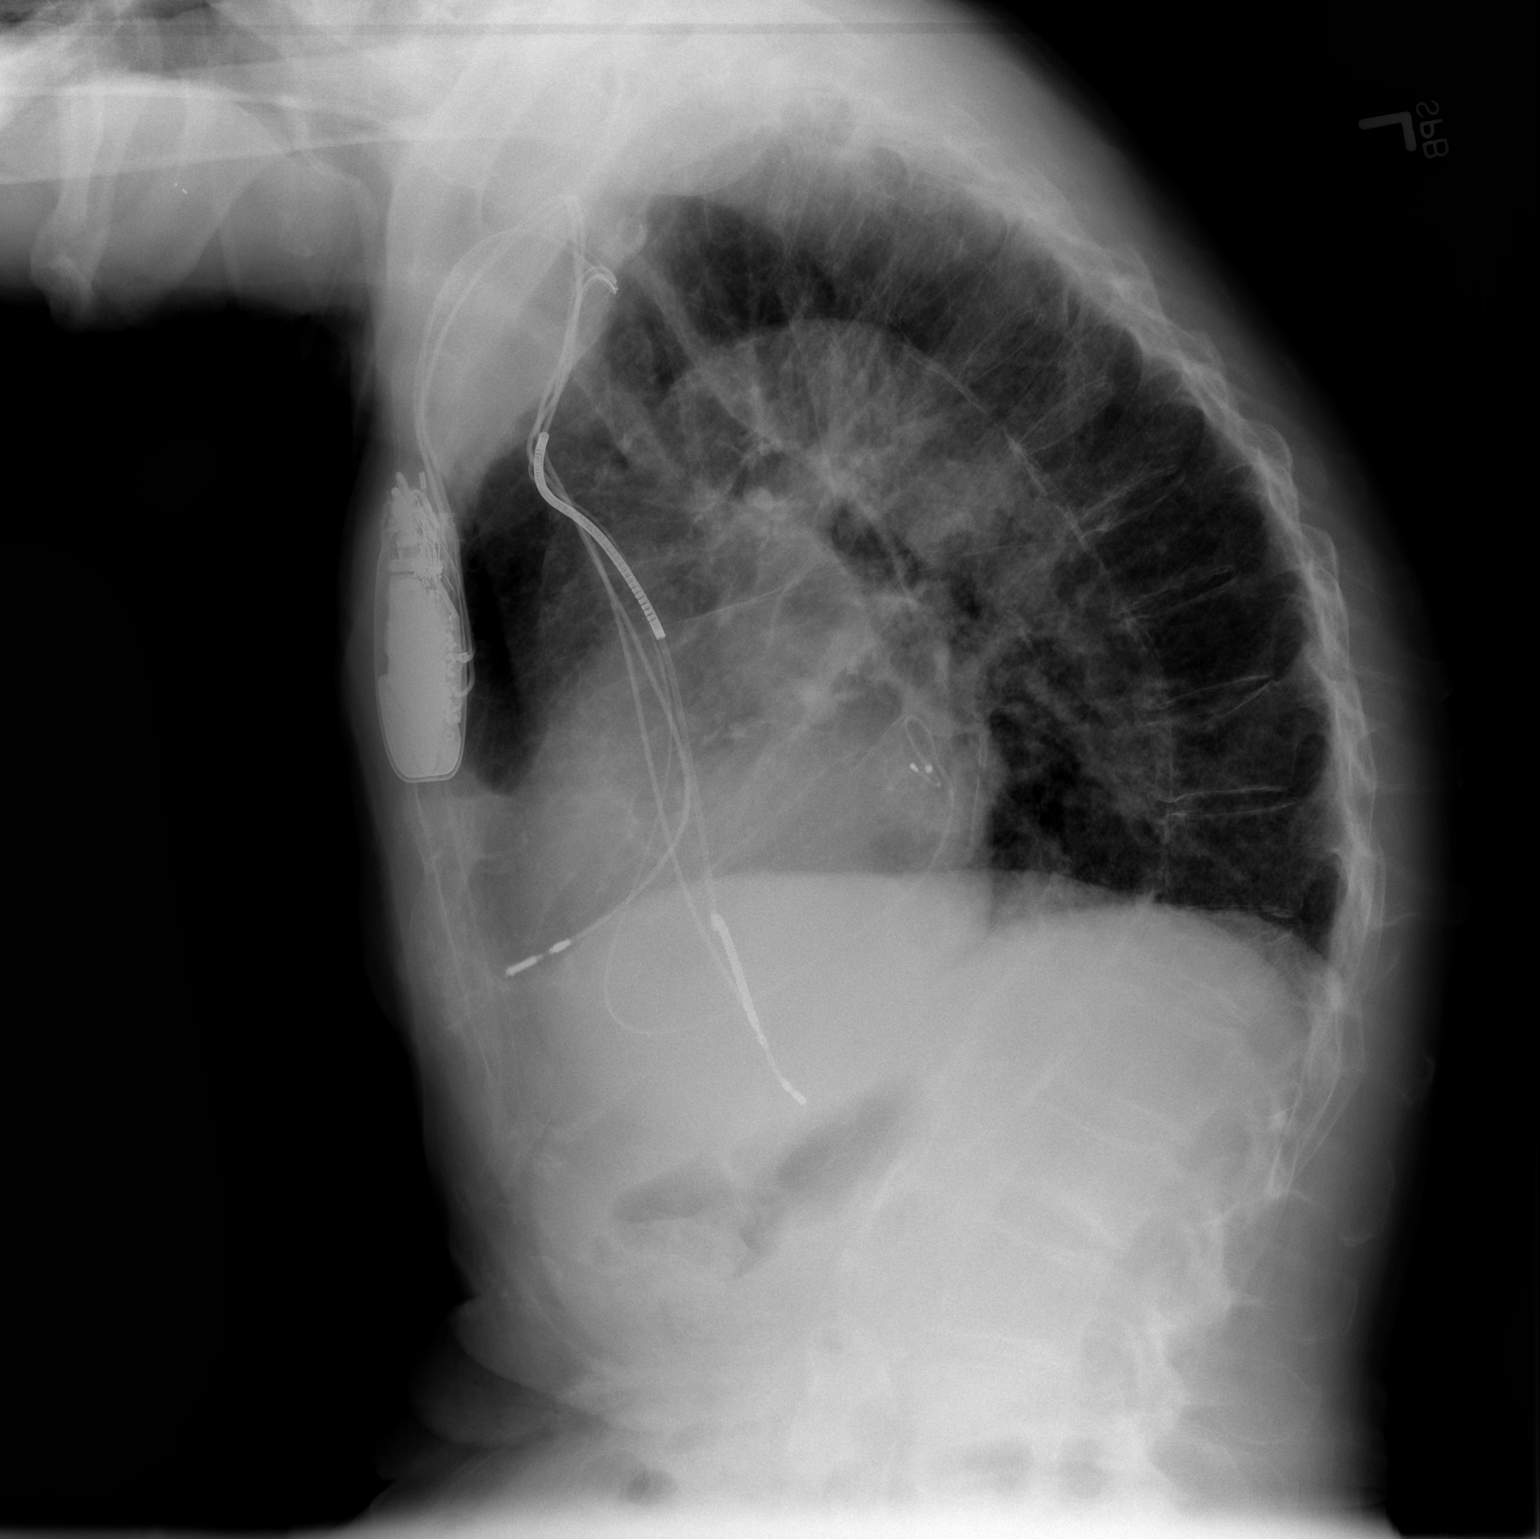

[2 of 2 positions shown; findings below may reference images not displayed]

FINDINGS: There is slightly more opacity within the left upper lobe
extending toward the apex than on the prior chest x-ray.  This
opacity could represent pneumonia, but a developing lung lesion
cannot be excluded.  No effusion is seen.  Cardiomegaly is stable
and defibrillator leads remain.  The bones are osteopenic and
compressed lower thoracic vertebral bodies are stable.
IMPRESSION: 1.  More opacity in the left upper lobe toward the apex.  Possible
pneumonia but cannot exclude underlying mass.  Recommend continued
follow-up or CT chest with IV contrast media.
2.  Stable cardiomegaly.
